# Patient Record
Sex: Female | Born: 1985
Health system: Southern US, Community
[De-identification: ages and names within clinical notes are randomized; demographics above are authoritative.]

## PROBLEM LIST (undated history)

## (undated) DIAGNOSIS — R7303 Prediabetes: Secondary | ICD-10-CM

## (undated) DIAGNOSIS — T7840XA Allergy, unspecified, initial encounter: Secondary | ICD-10-CM

## (undated) DIAGNOSIS — D649 Anemia, unspecified: Secondary | ICD-10-CM

## (undated) DIAGNOSIS — L259 Unspecified contact dermatitis, unspecified cause: Secondary | ICD-10-CM

## (undated) DIAGNOSIS — Z789 Other specified health status: Secondary | ICD-10-CM

## (undated) DIAGNOSIS — G4730 Sleep apnea, unspecified: Secondary | ICD-10-CM

## (undated) HISTORY — PX: WISDOM TOOTH EXTRACTION: SHX21

## (undated) HISTORY — PX: TOTAL ABDOMINAL HYSTERECTOMY: SHX209

## (undated) HISTORY — DX: Unspecified contact dermatitis, unspecified cause: L25.9

## (undated) HISTORY — PX: FOOT SURGERY: SHX648

## (undated) HISTORY — PX: ABDOMINAL HYSTERECTOMY: SHX81

## (undated) HISTORY — DX: Allergy, unspecified, initial encounter: T78.40XA

## (undated) HISTORY — DX: Prediabetes: R73.03

## (undated) HISTORY — DX: Anemia, unspecified: D64.9

## (undated) HISTORY — DX: Sleep apnea, unspecified: G47.30

---
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
## 2010-05-13 ENCOUNTER — Emergency Department (HOSPITAL_COMMUNITY): Admission: EM | Admit: 2010-05-13 | Discharge: 2009-11-10 | Payer: Self-pay | Admitting: Emergency Medicine

## 2010-09-10 ENCOUNTER — Inpatient Hospital Stay (HOSPITAL_COMMUNITY): Payer: Medicaid Other

## 2010-09-10 ENCOUNTER — Inpatient Hospital Stay (HOSPITAL_COMMUNITY)
Admission: AD | Admit: 2010-09-10 | Discharge: 2010-09-10 | Disposition: A | Discharge status: Home / Self Care | Payer: Medicaid Other | Source: Ambulatory Visit | Attending: Obstetrics and Gynecology | Admitting: Obstetrics and Gynecology

## 2010-09-10 DIAGNOSIS — L299 Pruritus, unspecified: Secondary | ICD-10-CM | POA: Insufficient documentation

## 2010-09-10 DIAGNOSIS — O9989 Other specified diseases and conditions complicating pregnancy, childbirth and the puerperium: Secondary | ICD-10-CM | POA: Insufficient documentation

## 2010-09-10 DIAGNOSIS — O479 False labor, unspecified: Secondary | ICD-10-CM | POA: Insufficient documentation

## 2010-09-10 LAB — DIFFERENTIAL
Basophils Relative: 0 % (ref 0–1)
Eosinophils Relative: 3 % (ref 0–5)
Lymphs Abs: 1.7 10*3/uL (ref 0.7–4.0)
Neutro Abs: 7.1 10*3/uL (ref 1.7–7.7)
Neutrophils Relative %: 72 % (ref 43–77)

## 2010-09-10 LAB — URINALYSIS, ROUTINE W REFLEX MICROSCOPIC
Bilirubin Urine: NEGATIVE
Hgb urine dipstick: NEGATIVE
Ketones, ur: NEGATIVE mg/dL
pH: 7 (ref 5.0–8.0)

## 2010-09-10 LAB — COMPREHENSIVE METABOLIC PANEL
Alkaline Phosphatase: 229 U/L — ABNORMAL HIGH (ref 39–117)
Calcium: 9.2 mg/dL (ref 8.4–10.5)
Creatinine, Ser: 0.57 mg/dL (ref 0.4–1.2)
GFR calc non Af Amer: >60 mL/min (ref >60)
Glucose, Bld: 82 mg/dL (ref 70–99)
Sodium: 136 mEq/L (ref 135–145)
Total Bilirubin: 0.7 mg/dL (ref 0.3–1.2)

## 2010-09-10 LAB — CBC
MCH: 25.9 pg — ABNORMAL LOW (ref 26.0–34.0)
WBC: 9.8 10*3/uL (ref 4.0–10.5)

## 2010-09-14 ENCOUNTER — Inpatient Hospital Stay (HOSPITAL_COMMUNITY): Admission: AD | Admit: 2010-09-14 | Payer: Self-pay | Admitting: Obstetrics and Gynecology

## 2010-09-17 ENCOUNTER — Inpatient Hospital Stay (HOSPITAL_COMMUNITY)
Admission: RE | Admit: 2010-09-17 | Discharge: 2010-09-19 | DRG: 775 | Disposition: A | Discharge status: Home / Self Care | Payer: Medicaid Other | Source: Ambulatory Visit | Attending: Obstetrics and Gynecology | Admitting: Obstetrics and Gynecology

## 2010-09-17 DIAGNOSIS — L299 Pruritus, unspecified: Secondary | ICD-10-CM | POA: Diagnosis present

## 2010-09-17 DIAGNOSIS — O26899 Other specified pregnancy related conditions, unspecified trimester: Principal | ICD-10-CM | POA: Diagnosis present

## 2010-09-17 LAB — CBC
HCT: 38.3 % (ref 36.0–46.0)
Hemoglobin: 12.5 g/dL (ref 12.0–15.0)
MCH: 26.6 pg (ref 26.0–34.0)
MCHC: 32.6 g/dL (ref 30.0–36.0)
Platelets: 191 10*3/uL (ref 150–400)
RBC: 4.7 MIL/uL (ref 3.87–5.11)
RDW: 16.1 % — ABNORMAL HIGH (ref 11.5–15.5)
WBC: 13.6 10*3/uL — ABNORMAL HIGH (ref 4.0–10.5)

## 2010-09-17 LAB — RPR: RPR Ser Ql: NONREACTIVE

## 2010-09-18 LAB — CBC
HCT: 36.7 % (ref 36.0–46.0)
Hemoglobin: 11.5 g/dL — ABNORMAL LOW (ref 12.0–15.0)
MCHC: 31.3 g/dL (ref 30.0–36.0)
Platelets: 169 10*3/uL (ref 150–400)
RBC: 4.45 MIL/uL (ref 3.87–5.11)
RDW: 16.4 % — ABNORMAL HIGH (ref 11.5–15.5)
WBC: 12.4 10*3/uL — ABNORMAL HIGH (ref 4.0–10.5)

## 2011-05-07 ENCOUNTER — Encounter: Payer: Self-pay | Admitting: Emergency Medicine

## 2011-05-07 ENCOUNTER — Emergency Department (HOSPITAL_COMMUNITY)
Admission: EM | Admit: 2011-05-07 | Discharge: 2011-05-08 | Disposition: A | Discharge status: Home / Self Care | Payer: Medicaid Other | Attending: Emergency Medicine | Admitting: Emergency Medicine

## 2011-05-07 DIAGNOSIS — A499 Bacterial infection, unspecified: Secondary | ICD-10-CM | POA: Insufficient documentation

## 2011-05-07 DIAGNOSIS — R109 Unspecified abdominal pain: Secondary | ICD-10-CM | POA: Insufficient documentation

## 2011-05-07 DIAGNOSIS — N760 Acute vaginitis: Secondary | ICD-10-CM | POA: Insufficient documentation

## 2011-05-07 DIAGNOSIS — B9689 Other specified bacterial agents as the cause of diseases classified elsewhere: Secondary | ICD-10-CM

## 2011-05-07 LAB — URINALYSIS, ROUTINE W REFLEX MICROSCOPIC
Hgb urine dipstick: NEGATIVE
Nitrite: NEGATIVE

## 2011-05-07 NOTE — ED Notes (Signed)
Pt presented to the ER with c/o abd pain, started about an hour and half ago, mid abd pain, non radiating, feels like a contractions, at best 7/10 and at its worse 10/10. Denies n/v/d

## 2011-05-08 LAB — POCT I-STAT, CHEM 8
BUN: 14 mg/dL (ref 6–23)
Calcium, Ion: 1.22 mmol/L (ref 1.12–1.32)
Chloride: 104 mEq/L (ref 96–112)
Creatinine, Ser: 0.8 mg/dL (ref 0.50–1.10)
Glucose, Bld: 96 mg/dL (ref 70–99)
HCT: 41 % (ref 36.0–46.0)
Hemoglobin: 13.9 g/dL (ref 12.0–15.0)
Sodium: 140 mEq/L (ref 135–145)

## 2011-05-08 LAB — CBC
Hemoglobin: 12.8 g/dL (ref 12.0–15.0)
MCHC: 32.4 g/dL (ref 30.0–36.0)
Platelets: 251 10*3/uL (ref 150–400)
RBC: 4.67 MIL/uL (ref 3.87–5.11)
WBC: 11.5 10*3/uL — ABNORMAL HIGH (ref 4.0–10.5)

## 2011-05-08 LAB — DIFFERENTIAL
Basophils Absolute: 0 10*3/uL (ref 0.0–0.1)
Eosinophils Absolute: 0.2 10*3/uL (ref 0.0–0.7)
Lymphocytes Relative: 33 % (ref 12–46)
Monocytes Absolute: 0.6 10*3/uL (ref 0.1–1.0)
Monocytes Relative: 5 % (ref 3–12)

## 2011-05-08 LAB — WET PREP, GENITAL

## 2011-05-08 MED ORDER — METRONIDAZOLE 500 MG PO TABS: 500.0000 mg | ORAL_TABLET | Freq: Two times a day (BID) | ORAL | Status: AC

## 2011-05-08 NOTE — ED Provider Notes (Signed)
History     CSN: [id number] Arrival date & time: 05/07/2011  9:14 PM   First MD Initiated Contact with Patient 05/08/11 0044      Chief Complaint  Patient presents with  . Abdominal Pain    (Consider location/radiation/quality/duration/timing/severity/associated sxs/prior treatment) HPI  History reviewed. No pertinent past medical history.  Past Surgical History  Procedure Date  . Wisdom tooth extraction     History reviewed. No pertinent family history.  History  Substance Use Topics  . Smoking status: Never Smoker   . Smokeless tobacco: Not on file  . Alcohol Use: Yes     cocktail every few months    OB History    Grav Para Term Preterm Abortions TAB SAB Ect Mult Living   2 2 2              Review of Systems  Allergies  Amoxicillin  Home Medications   Current Outpatient Rx  Name Route Sig Dispense Refill  . LEVONORGESTREL 20 MCG/24HR IU IUD Intrauterine 1 each by Intrauterine route once.      Marland Kitchen METRONIDAZOLE 500 MG PO TABS Oral Take 1 tablet (500 mg total) by mouth 2 (two) times daily. 14 tablet 0    BP 123/72  Pulse 85  Temp(Src) 98.8 F (37.1 C) (Oral)  Resp 18  SpO2 100%  Physical Exam  ED Course  Procedures Pt has had no further c/o abd pain.  Discussed findings and d/c plan w/ pt and spouse. Pt declined short course of medication for pain. Will d/c home on Flagyl and encourage f/u w/ PCP. Referrals provided.  Labs Reviewed  URINALYSIS, ROUTINE W REFLEX MICROSCOPIC - Abnormal; Notable for the following:    APPearance CLOUDY (*)    Protein, ur 30 (*)    All other components within normal limits  URINE MICROSCOPIC-ADD ON - Abnormal; Notable for the following:    Squamous Epithelial / LPF MANY (*)    Bacteria, UA MANY (*)    All other components within normal limits  WET PREP, GENITAL - Abnormal; Notable for the following:    Clue Cells, Wet Prep FEW (*)    WBC, Wet Prep HPF POC FEW (*)    All other components within normal limits  CBC -  Abnormal; Notable for the following:    WBC 11.5 (*)    All other components within normal limits  DIFFERENTIAL  POCT I-STAT, CHEM 8  URINE CULTURE  I-STAT, CHEM 8  GC/CHLAMYDIA PROBE AMP, GENITAL  PREGNANCY, URINE   No results found.   1. Bacterial vaginosis       MDM  Bacterial vaginosis per wet prep. Discussed pt w/ Dr Jeraldine Loots who agrees w/ plan.       Leanne Chang, NP 05/08/11 [phone]

## 2011-05-08 NOTE — ED Provider Notes (Signed)
History     CSN: [id number] Arrival date & time: 05/07/2011  9:14 PM   First MD Initiated Contact with Patient 05/08/11 0044      Chief Complaint  Patient presents with  . Abdominal Pain    (Consider location/radiation/quality/duration/timing/severity/associated sxs/prior treatment) Patient is a 25 y.o. female presenting with abdominal pain. The history is provided by the patient and the spouse.  Abdominal Pain The primary symptoms of the illness include abdominal pain. The primary symptoms of the illness do not include fever, nausea, vomiting, dysuria or vaginal discharge. Primary symptoms comment: She complains of cramping abdominal pain that eases to a dull ache, episodes recurrent since around 6:30 p.m. The current episode started 3 to 5 hours ago. The onset of the illness was sudden.  The patient states that she believes she is currently not pregnant. The patient has not had a change in bowel habit. Symptoms associated with the illness do not include chills.    History reviewed. No pertinent past medical history.  Past Surgical History  Procedure Date  . Wisdom tooth extraction     History reviewed. No pertinent family history.  History  Substance Use Topics  . Smoking status: Never Smoker   . Smokeless tobacco: Not on file  . Alcohol Use: Yes     cocktail every few months    OB History    Grav Para Term Preterm Abortions TAB SAB Ect Mult Living   2 2 2              Review of Systems  Constitutional: Negative for fever and chills.  HENT: Negative.   Respiratory: Negative.   Cardiovascular: Negative.   Gastrointestinal: Positive for abdominal pain. Negative for nausea and vomiting.  Genitourinary: Negative for dysuria and vaginal discharge.  Musculoskeletal: Negative.   Skin: Negative.   Neurological: Negative.     Allergies  Amoxicillin  Home Medications   Current Outpatient Rx  Name Route Sig Dispense Refill  . LEVONORGESTREL 20 MCG/24HR IU IUD  Intrauterine 1 each by Intrauterine route once.        BP 123/72  Pulse 85  Temp(Src) 98.8 F (37.1 C) (Oral)  Resp 18  SpO2 100%  Physical Exam  Constitutional: She appears well-developed and well-nourished.  HENT:  Head: Normocephalic.  Neck: Normal range of motion. Neck supple.  Cardiovascular: Normal rate and regular rhythm.   Pulmonary/Chest: Effort normal and breath sounds normal.  Abdominal: Soft. Bowel sounds are normal. There is no tenderness. There is no rebound and no guarding.  Genitourinary:       Purulent vaginal discharge present in cervix. IUD in place. Uterine tenderness without significant adnexal tenderness or mass.   Musculoskeletal: Normal range of motion.  Neurological: She is alert. No cranial nerve deficit.  Skin: Skin is warm and dry. No rash noted.  Psychiatric: She has a normal mood and affect.    ED Course  Procedures (including critical care time)  Labs Reviewed  URINALYSIS, ROUTINE W REFLEX MICROSCOPIC - Abnormal; Notable for the following:    APPearance CLOUDY (*)    Protein, ur 30 (*)    All other components within normal limits  URINE MICROSCOPIC-ADD ON - Abnormal; Notable for the following:    Squamous Epithelial / LPF MANY (*)    Bacteria, UA MANY (*)    All other components within normal limits  URINE CULTURE   No results found.   No diagnosis found.    MDM  Rodena Medin, PA 05/08/11 [phone]

## 2011-05-08 NOTE — ED Notes (Signed)
Patient was given DC instructions with follow up care instructions Patient gave verbal understanding V/S stable.   Patient DC to home under her own ambulatory power. She was not showing any signs of distress at this time

## 2011-05-09 LAB — URINE CULTURE

## 2011-05-09 LAB — GC/CHLAMYDIA PROBE AMP, GENITAL: Chlamydia, DNA Probe: NEGATIVE

## 2011-05-09 NOTE — ED Provider Notes (Signed)
Medical screening examination/treatment/procedure(s) were performed by non-physician practitioner and as supervising physician I was immediately available for consultation/collaboration.   Gerard Bonus, MD 05/09/11 0250 

## 2011-05-09 NOTE — ED Provider Notes (Signed)
Medical screening examination/treatment/procedure(s) were performed by non-physician practitioner and as supervising physician I was immediately available for consultation/collaboration.  (THIS IS A DUPLICATE NOTE)  Gerhard Munch, MD 05/09/11 [phone]

## 2011-10-13 ENCOUNTER — Emergency Department (INDEPENDENT_AMBULATORY_CARE_PROVIDER_SITE_OTHER): Payer: 59

## 2011-10-13 ENCOUNTER — Emergency Department (HOSPITAL_COMMUNITY)
Admission: EM | Admit: 2011-10-13 | Discharge: 2011-10-13 | Disposition: A | Discharge status: Home / Self Care | Payer: 59 | Source: Home / Self Care | Attending: Emergency Medicine | Admitting: Emergency Medicine

## 2011-10-13 ENCOUNTER — Encounter (HOSPITAL_COMMUNITY): Payer: Self-pay | Admitting: Emergency Medicine

## 2011-10-13 DIAGNOSIS — S8990XA Unspecified injury of unspecified lower leg, initial encounter: Secondary | ICD-10-CM

## 2011-10-13 DIAGNOSIS — S99929A Unspecified injury of unspecified foot, initial encounter: Secondary | ICD-10-CM

## 2011-10-13 MED ORDER — MELOXICAM 7.5 MG PO TABS: 7.5000 mg | ORAL_TABLET | Freq: Every day | ORAL | Status: DC

## 2011-10-13 NOTE — ED Notes (Signed)
PT HERE WITH LEFT KNEE SWELLING AND PAIN THAT STARTED X 2 WEEKS AGO.SWELLING SEEN DECREASED BUT UNABLE TO BEND OR SQUAT AND POPPING.DENIES INJURY OR STRAIN.TYLENOL AND ALEVE USED

## 2011-10-13 NOTE — ED Provider Notes (Signed)
History     CSN: [id number]  Arrival date & time 10/13/11  4098   First MD Initiated Contact with Patient 10/13/11 [phone]      Chief Complaint  Patient presents with  . Knee Pain    (Consider location/radiation/quality/duration/timing/severity/associated sxs/prior treatment) HPI Comments: For about 2 weeks my left knee has been hurting initially was somewhat swollen and have been hearing "popping sounds". Has been difficult to bend or squat. Swelling has come down but is still tender in this area (points to lateral aspect of left knee).  Patient denies any constitutional symptoms such as fever, malaise chills or changes in appetite. Patient denies any change skin coloration.  Patient is a 26 y.o. female presenting with knee pain. The history is provided by the patient.  Knee Pain The current episode started more than 1 week ago. The problem occurs constantly. The problem has not changed since onset.The symptoms are aggravated by bending, standing and walking. The symptoms are relieved by NSAIDs. The treatment provided mild relief.    History reviewed. No pertinent past medical history.  Past Surgical History  Procedure Date  . Wisdom tooth extraction     No family history on file.  History  Substance Use Topics  . Smoking status: Never Smoker   . Smokeless tobacco: Not on file  . Alcohol Use: Yes     cocktail every few months    OB History    Grav Para Term Preterm Abortions TAB SAB Ect Mult Living   2 2 2              Review of Systems  Constitutional: Negative for fever, chills, diaphoresis, activity change, appetite change and fatigue.  Musculoskeletal: Positive for joint swelling and arthralgias. Negative for back pain and gait problem.  Skin: Negative for color change, pallor, rash and wound.    Allergies  Amoxicillin  Home Medications   Current Outpatient Rx  Name Route Sig Dispense Refill  . LEVONORGESTREL 20 MCG/24HR IU IUD Intrauterine 1 each by  Intrauterine route once.      . MELOXICAM 7.5 MG PO TABS Oral Take 1 tablet (7.5 mg total) by mouth daily. 14 tablet 0    BP 108/79  Pulse 72  Temp(Src) 97.5 F (36.4 C) (Oral)  Resp 16  SpO2 100%  Physical Exam  Nursing note and vitals reviewed. Constitutional: She appears well-developed and well-nourished.  HENT:  Head: Normocephalic.  Eyes: Conjunctivae are normal.  Neck: Neck supple.  Pulmonary/Chest: Effort normal.  Abdominal: There is no rebound.  Musculoskeletal: She exhibits tenderness. She exhibits no edema.       Left knee: She exhibits decreased range of motion, ecchymosis and bony tenderness. She exhibits no swelling, no effusion, no deformity, no erythema, normal alignment, no LCL laxity, normal patellar mobility, normal meniscus and no MCL laxity. tenderness found. Lateral joint line and LCL tenderness noted. No patellar tendon tenderness noted.       Legs: Neurological: She is alert.  Skin: No rash noted. No erythema.    ED Course  Procedures (including critical care time)  Labs Reviewed - No data to display Dg Knee Complete 4 Views Left  10/13/2011  *RADIOLOGY REPORT*  Clinical Data: Knee pain, swelling.  LEFT KNEE - COMPLETE 4+ VIEW  Comparison: None.  Findings: No acute bony abnormality.  Specifically, no fracture, subluxation, or dislocation.  Soft tissues are intact.  No joint effusion.  IMPRESSION: No acute bony abnormality.  Original Report Authenticated By: Cyndie Chime, M.D.  1. Injury of knee, ligament       MDM  Exam suggest a left knee collateral or lateral meniscus sprain or strain. Knee feels stable with no increased laxity with different maneuvers. Patient denies any known trauma or injury or twisting motion. Have recommended patient to followup with the orthopedic doctor pain persists beyond in 10-14 days. Patient agree with treatment plan followup care as necessary. All signs of clinical or radiological knee effusion, no signs of a septic  knee.       Jimmie Molly, MD 10/13/11 [phone]

## 2011-10-13 NOTE — Discharge Instructions (Signed)
Followup with the orthopedic Dr. is provided knee sleeve and take this anti-inflammatory medicine. Your x-rays were unremarkable your exam to suggest a collateral ligament sprain/ strain or injury.   Knee Wraps (Elastic Bandage) and RICE Knee wraps come in many different shapes and sizes and perform many different functions. Some wraps may provide cold therapy or warmth. Your caregiver will help you to determine what is best for your protection, or recovery following your injury. The following are some general tips to help you use a knee wrap:  Use the wrap as directed.   Do not keep the wrap so tight that it cuts off the circulation of the leg below the wrap.   If your lower leg becomes blue, loses feeling, or becomes swollen below the wrap, it is probably too tight. Loosen the wrap as needed to improve these problems.   See your caregiver or trainer if the wrap seems to be making your problems worse rather than better.  Wraps in general help to remind you that you have an injury. They provide limited support. The few pounds of support they provide are minimal considering the hundreds of pounds of pressure it takes to injure a joint or tear ligaments.  The routine care of many injuries includes Rest, Ice, Compression, and Elevation (RICE).  Rest is required to allow your body to heal. Generally following bumps and bruises, routine activities can be resumed when comfortable. Injured tendons (cord-like structures that attach muscle to bone) and bones take approximately 6 to 12 weeks to heal.   Ice following an injury helps keep the swelling down and reduces pain. Do not apply ice directly to skin. Apply ice bags for 20-30 minutes every 3-4 hours for the first 2-3 days following injury or surgery. Place ice in a plastic bag with a towel around it.   Compression helps keep swelling down, gives support, and helps with discomfort. If a knee wrap has been applied, it should be removed and reapplied  every 3 to 4 hours. It should be applied firmly enough to keep swelling down, but not too tightly. Watch your lower leg and toes for swelling, bluish discoloration, coldness, numbness or excessive pain. If any of these symptoms (problems) occur, remove the knee wrap and reapply more loosely. If these symptoms persist, contact your caregiver immediately.   Elevation helps reduce swelling, and decreases pain. With extremities (arms/hands and legs/feet), the injured area should be placed near to or above the level of the heart if possible.  Persistent pain and inability to use the injured area for more than 2 to 3 days are warning signs indicating that you should see a caregiver for a follow-up visit as soon as possible. Initially, a hairline fracture (this is the same as a broken bone) may not be seen on x-rays.  Persistent pain and swelling mean limitedweight bearing (use of crutches as instructed) should continue. You may need further x-rays.  X-rays may not show a non-displaced fracture until a week or ten days later. Make a follow-up appointment with your caregiver. A radiologist (a specialist in reading x-rays) will re-read your x-rays. Make sure you know how to get your x-ray results. Do not assume everything is normal if you do not hear from your caregiver. MAKE SURE YOU:   Understand these instructions.   Will watch your condition.   Will get help right away if you are not doing well or get worse.  Document Released: 11/12/2001 Document Revised: 05/12/2011 Document Reviewed: 09/11/2008 [employer]  Patient Information 2012 [employer]. 

## 2012-04-04 ENCOUNTER — Encounter (HOSPITAL_COMMUNITY): Payer: Self-pay

## 2012-04-04 ENCOUNTER — Emergency Department (HOSPITAL_COMMUNITY)
Admission: EM | Admit: 2012-04-04 | Discharge: 2012-04-04 | Disposition: A | Discharge status: Home / Self Care | Payer: 59 | Source: Home / Self Care | Attending: Family Medicine | Admitting: Family Medicine

## 2012-04-04 ENCOUNTER — Emergency Department (INDEPENDENT_AMBULATORY_CARE_PROVIDER_SITE_OTHER): Payer: 59

## 2012-04-04 DIAGNOSIS — S6710XA Crushing injury of unspecified finger(s), initial encounter: Secondary | ICD-10-CM

## 2012-04-04 NOTE — ED Provider Notes (Signed)
History     CSN: [id number]  Arrival date & time 04/04/12  1623   First MD Initiated Contact with Patient 04/04/12 1917      Chief Complaint  Patient presents with  . Finger Injury    (Consider location/radiation/quality/duration/timing/severity/associated sxs/prior treatment) Patient is a 26 y.o. female presenting with hand pain. The history is provided by the patient.  Hand Pain This is a new problem. The current episode started 3 to 5 hours ago. The problem has not changed since onset.The symptoms are aggravated by bending. Nothing relieves the symptoms. She has tried nothing for the symptoms.  Patient reports she slammed her left fifth finger in car door today, pain is worse with movement and bending of finger.  Have not taken medication for injury.  No previous finger/hand injury.    History reviewed. No pertinent past medical history.  Past Surgical History  Procedure Date  . Wisdom tooth extraction     History reviewed. No pertinent family history.  History  Substance Use Topics  . Smoking status: Never Smoker   . Smokeless tobacco: Not on file  . Alcohol Use: Yes     cocktail every few months    OB History    Grav Para Term Preterm Abortions TAB SAB Ect Mult Living   2 2 2              Review of Systems  Constitutional: Negative.   Respiratory: Negative.   Cardiovascular: Negative.   Musculoskeletal: Positive for joint swelling and arthralgias.  Skin: Negative.   Neurological: Negative.     Allergies  Amoxicillin  Home Medications   Current Outpatient Rx  Name Route Sig Dispense Refill  . LEVONORGESTREL 20 MCG/24HR IU IUD Intrauterine 1 each by Intrauterine route once.      . MELOXICAM 7.5 MG PO TABS Oral Take 1 tablet (7.5 mg total) by mouth daily. 14 tablet 0    BP 113/83  Pulse 77  Temp 98.4 F (36.9 C) (Oral)  Resp 17  SpO2 98%  LMP 03/19/2012  Physical Exam  Nursing note and vitals reviewed. Constitutional: She is oriented to  person, place, and time. Vital signs are normal. She appears well-developed and well-nourished. She is active and cooperative.  HENT:  Head: Normocephalic.  Eyes: Conjunctivae normal are normal. Pupils are equal, round, and reactive to light. No scleral icterus.  Neck: Trachea normal. Neck supple.  Cardiovascular: Normal rate, regular rhythm, normal heart sounds and intact distal pulses.   Pulmonary/Chest: Effort normal and breath sounds normal.  Musculoskeletal:       Left hand: Normal.       Hands: Neurological: She is alert and oriented to person, place, and time. No cranial nerve deficit or sensory deficit.  Skin: Skin is warm and dry.  Psychiatric: She has a normal mood and affect. Her speech is normal and behavior is normal. Judgment and thought content normal. Cognition and memory are normal.    ED Course  Procedures (including critical care time)  Labs Reviewed - No data to display Dg Finger Little Left  04/04/2012  *RADIOLOGY REPORT*  Clinical Data: .  Pain DIP joint.Crush injury.  LEFT LITTLE FINGER 2+V  Comparison: None.  Findings: No evidence of fracture, dislocation or focal lesion.  IMPRESSION: Negative radiographs   Original Report Authenticated By: Thomasenia Sales, M.D.      1. Injury, crush, finger       MDM  Finger splint as needed, take ibuprofen for pain.  Johnsie Kindred, NP 04/04/12 2003

## 2012-04-04 NOTE — ED Notes (Signed)
States she accidentally slammed her left 5th finger in door earlier today; c/o pain/deformity same

## 2012-04-05 NOTE — ED Provider Notes (Signed)
Medical screening examination/treatment/procedure(s) were performed by resident physician or non-physician practitioner and as supervising physician I was immediately available for consultation/collaboration.   Shanara Schnieders DOUGLAS MD.    Jacinta Penalver D Adiva Boettner, MD 04/05/12 1855 

## 2012-05-22 ENCOUNTER — Ambulatory Visit: Payer: 59 | Admitting: Psychology

## 2012-07-25 ENCOUNTER — Other Ambulatory Visit (HOSPITAL_COMMUNITY)
Admission: RE | Admit: 2012-07-25 | Discharge: 2012-07-25 | Disposition: A | Discharge status: Home / Self Care | Payer: 59 | Source: Ambulatory Visit | Attending: Family Medicine | Admitting: Family Medicine

## 2012-07-25 ENCOUNTER — Other Ambulatory Visit: Payer: Self-pay | Admitting: Family Medicine

## 2012-07-25 DIAGNOSIS — Z0000 Encounter for general adult medical examination without abnormal findings: Secondary | ICD-10-CM | POA: Insufficient documentation

## 2012-08-30 ENCOUNTER — Emergency Department (HOSPITAL_COMMUNITY)
Admission: EM | Admit: 2012-08-30 | Discharge: 2012-08-31 | Disposition: A | Discharge status: Home / Self Care | Payer: 59 | Attending: Emergency Medicine | Admitting: Emergency Medicine

## 2012-08-30 ENCOUNTER — Encounter (HOSPITAL_COMMUNITY): Payer: Self-pay | Admitting: *Deleted

## 2012-08-30 ENCOUNTER — Emergency Department (HOSPITAL_COMMUNITY): Payer: 59

## 2012-08-30 ENCOUNTER — Encounter (HOSPITAL_COMMUNITY): Payer: Self-pay | Admitting: Adult Health

## 2012-08-30 ENCOUNTER — Emergency Department (HOSPITAL_COMMUNITY)
Admission: EM | Admit: 2012-08-30 | Discharge: 2012-08-30 | Disposition: A | Discharge status: Nursing Home (Includes ICF) | Payer: 59 | Source: Home / Self Care | Attending: Emergency Medicine | Admitting: Emergency Medicine

## 2012-08-30 DIAGNOSIS — R509 Fever, unspecified: Secondary | ICD-10-CM | POA: Insufficient documentation

## 2012-08-30 DIAGNOSIS — R109 Unspecified abdominal pain: Secondary | ICD-10-CM

## 2012-08-30 DIAGNOSIS — R1011 Right upper quadrant pain: Secondary | ICD-10-CM | POA: Insufficient documentation

## 2012-08-30 DIAGNOSIS — R112 Nausea with vomiting, unspecified: Secondary | ICD-10-CM | POA: Insufficient documentation

## 2012-08-30 DIAGNOSIS — Z3202 Encounter for pregnancy test, result negative: Secondary | ICD-10-CM | POA: Insufficient documentation

## 2012-08-30 LAB — CBC WITH DIFFERENTIAL/PLATELET
Basophils Absolute: 0 10*3/uL (ref 0.0–0.1)
Basophils Relative: 0 % (ref 0–1)
Eosinophils Absolute: 0.1 10*3/uL (ref 0.0–0.7)
HCT: 39.4 % (ref 36.0–46.0)
Hemoglobin: 13.1 g/dL (ref 12.0–15.0)
MCH: 28 pg (ref 26.0–34.0)
MCHC: 33.2 g/dL (ref 30.0–36.0)
Monocytes Absolute: 0.4 10*3/uL (ref 0.1–1.0)
Monocytes Relative: 4 % (ref 3–12)
RDW: 14.1 % (ref 11.5–15.5)

## 2012-08-30 LAB — POCT URINALYSIS DIP (DEVICE)
Ketones, ur: 15 mg/dL — AB
Leukocytes, UA: NEGATIVE
Protein, ur: NEGATIVE mg/dL
Specific Gravity, Urine: 1.025 (ref 1.005–1.030)
Urobilinogen, UA: 1 mg/dL (ref 0.0–1.0)
pH: 6.5 (ref 5.0–8.0)

## 2012-08-30 LAB — COMPREHENSIVE METABOLIC PANEL
Albumin: 4 g/dL (ref 3.5–5.2)
BUN: 10 mg/dL (ref 6–23)
Calcium: 9.2 mg/dL (ref 8.4–10.5)
Creatinine, Ser: 0.66 mg/dL (ref 0.50–1.10)
GFR calc Af Amer: >90 mL/min (ref >90)
Glucose, Bld: 91 mg/dL (ref 70–99)
Total Protein: 7.7 g/dL (ref 6.0–8.3)

## 2012-08-30 LAB — POCT PREGNANCY, URINE: Preg Test, Ur: NEGATIVE

## 2012-08-30 MED ORDER — MORPHINE SULFATE 4 MG/ML IJ SOLN
4.0000 mg | Freq: Once | INTRAMUSCULAR | Status: AC
  Administered 2012-08-31: 4 mg via INTRAVENOUS
  Filled 2012-08-30: qty 1

## 2012-08-30 MED ORDER — ONDANSETRON HCL 4 MG/2ML IJ SOLN
4.0000 mg | Freq: Once | INTRAMUSCULAR | Status: DC
  Filled 2012-08-30: qty 2

## 2012-08-30 MED ORDER — ONDANSETRON HCL 4 MG/2ML IJ SOLN
4.0000 mg | Freq: Once | INTRAMUSCULAR | Status: AC
  Administered 2012-08-30: 4 mg via INTRAVENOUS
  Filled 2012-08-30: qty 2

## 2012-08-30 MED ORDER — SODIUM CHLORIDE 0.9 % IV BOLUS (SEPSIS)
1000.0000 mL | Freq: Once | INTRAVENOUS | Status: AC
  Administered 2012-08-30: 1000 mL via INTRAVENOUS

## 2012-08-30 NOTE — ED Provider Notes (Signed)
History     CSN: [id number]  Arrival date & time 08/30/12  1916   First MD Initiated Contact with Patient 08/30/12 1918      Chief Complaint  Patient presents with  . Abdominal Pain    (Consider location/radiation/quality/duration/timing/severity/associated sxs/prior treatment) HPI Comments: Patient presents urgent care complaining that as she was working today she started feeling chills feeling nauseous and started vomiting, she has been experiencing some cramping pains to her epigastric and right upper quadrant regions earlier today. At this point she is feeling constant pain in her epigastric and right upper quadrant region. Feeling nauseous and has vomited 3 times. She denies any urinary symptoms such as increased frequency pressure burning with urination denies any flank pain, vaginal discharge or bleeding. Patient relates that in the past she was told she could have gallbladder problems that she has experienced right upper quadrant pain before.  Patient is a 27 y.o. female presenting with abdominal pain. The history is provided by the patient and the spouse.  Abdominal Pain Pain location:  Epigastric and RUQ Pain quality: aching and cramping   Pain radiates to:  Does not radiate Pain severity:  Moderate Onset quality:  Sudden Timing:  Intermittent Progression:  Worsening Chronicity:  New Context: not medication withdrawal and not retching   Relieved by:  Nothing Associated symptoms: nausea and vomiting   Associated symptoms: no chest pain, no constipation, no cough, no diarrhea, no dysuria, no fatigue, no fever and no shortness of breath     No past medical history on file.  Past Surgical History  Procedure Laterality Date  . Wisdom tooth extraction      No family history on file.  History  Substance Use Topics  . Smoking status: Never Smoker   . Smokeless tobacco: Not on file  . Alcohol Use: Yes     Comment: cocktail every few months    OB History   Grav Para  Term Preterm Abortions TAB SAB Ect Mult Living   2 2 2              Review of Systems  Constitutional: Positive for activity change and appetite change. Negative for fever, fatigue and unexpected weight change.  Respiratory: Negative for cough and shortness of breath.   Cardiovascular: Negative for chest pain.  Gastrointestinal: Positive for nausea, vomiting and abdominal pain. Negative for diarrhea, constipation, blood in stool, abdominal distention and anal bleeding.  Genitourinary: Negative for dysuria, flank pain and pelvic pain.  Skin: Negative for pallor and rash.    Allergies  Amoxicillin  Home Medications   Current Outpatient Rx  Name  Route  Sig  Dispense  Refill  . levonorgestrel (MIRENA) 20 MCG/24HR IUD   Intrauterine   1 each by Intrauterine route once.           . meloxicam (MOBIC) 7.5 MG tablet   Oral   Take 1 tablet (7.5 mg total) by mouth daily.   14 tablet   0     BP 105/71  Pulse 92  Temp(Src) 98.2 F (36.8 C) (Oral)  Resp 16  SpO2 99%  LMP 05/05/2012  Physical Exam  Nursing note and vitals reviewed. Constitutional: Vital signs are normal.  Non-toxic appearance. She does not have a sickly appearance. She appears ill. She appears distressed.  Eyes: No scleral icterus.  Neck: Neck supple. No JVD present.  Cardiovascular: Normal rate.   Murmur heard. Pulmonary/Chest: Effort normal and breath sounds normal. No respiratory distress.  Abdominal: Soft. She  exhibits no mass. There is splenomegaly. There is no hepatosplenomegaly or hepatomegaly. There is tenderness in the right upper quadrant and epigastric area. There is positive Murphy's sign. There is no rigidity, no rebound, no CVA tenderness and no tenderness at McBurney's point.  Skin: No rash noted. No erythema.    ED Course  Procedures (including critical care time)  Labs Reviewed  POCT URINALYSIS DIP (DEVICE) - Abnormal; Notable for the following:    Ketones, ur 15 (*)    Hgb urine  dipstick SMALL (*)    All other components within normal limits  POCT PREGNANCY, URINE   No results found.   1. Right upper quadrant abdominal pain       MDM  Joni Reining- presents urgent care this evening complaining of abdominal pain and about 3 episodes of emesis. Concerning exam for right upper quadrant pain. Will transfer patient to the emergency department for further evaluation and rule out obstructive biliary disorder. Patient has been placed on n.p.o. until further evaluation. Urinalysis was unremarkable. With a negative pregnancy test.        Jimmie Molly, MD 08/30/12 2053

## 2012-08-30 NOTE — ED Notes (Signed)
Presents with upper right quadrant abdominal pain, nausea and emesis x3 today that began while at work. Pain began as cramping in lower right quadrant and has changed to upper right quadrant and described as aching. Denies diarrhea, denies vaginal discharge. Reports that she has had gallbladder problems in the past. SEen at [facility] and sent here for further evaluation UA done at [facility] and Preg negative.

## 2012-08-30 NOTE — ED Notes (Signed)
C/o R upper quadrant abdominal pain onset today while cleaning a pt.'s room @ 1700.  Had lower abdominal cramps earlier in the day.  LMP  May 05, 2012.  Using [place] IUD.  Vomited x 3.  Vomit was pinkish the first time and yellowish the second and brownish the third time.  No diarrhea.

## 2012-08-31 LAB — URINALYSIS, ROUTINE W REFLEX MICROSCOPIC
Ketones, ur: 40 mg/dL — AB
Nitrite: NEGATIVE
Specific Gravity, Urine: 1.02 (ref 1.005–1.030)
pH: 6 (ref 5.0–8.0)

## 2012-08-31 LAB — URINE MICROSCOPIC-ADD ON

## 2012-08-31 MED ORDER — PROMETHAZINE HCL 12.5 MG PO TABS
12.5000 mg | ORAL_TABLET | Freq: Once | ORAL | Status: AC
  Administered 2012-08-31: 12.5 mg via ORAL
  Filled 2012-08-31: qty 1

## 2012-08-31 MED ORDER — OXYCODONE-ACETAMINOPHEN 5-325 MG PO TABS: ORAL_TABLET | ORAL | Status: DC

## 2012-08-31 MED ORDER — PROMETHAZINE HCL 25 MG PO TABS: 25.0000 mg | ORAL_TABLET | Freq: Four times a day (QID) | ORAL | Status: DC | PRN

## 2012-08-31 NOTE — ED Provider Notes (Signed)
History     CSN: [id number]  Arrival date & time 08/30/12  2103   First MD Initiated Contact with Patient 08/30/12 2235      Chief Complaint  Patient presents with  . Abdominal Pain    (Consider location/radiation/quality/duration/timing/severity/associated sxs/prior treatment) HPI  Casey Hamilton is a 27 y.o. female complaining of right upper quadrant pain starting yesterday. Patient has had 4 episodes of nonbloody, nonbilious, non-coffee-ground appearance emesis starting at 5 PM today. She reports subjective fevers. She denies diarrhea, chest pain, cough, shortness of breath. Patient was told she had gallbladder issues, she has not had any abdominal pain nausea vomiting in the last 7 years.  History reviewed. No pertinent past medical history.  Past Surgical History  Procedure Laterality Date  . Wisdom tooth extraction      History reviewed. No pertinent family history.  History  Substance Use Topics  . Smoking status: Never Smoker   . Smokeless tobacco: Not on file  . Alcohol Use: Yes     Comment: cocktail every few months    OB History   Grav Para Term Preterm Abortions TAB SAB Ect Mult Living   2 2 2              Review of Systems  Constitutional: Positive for fever.  Respiratory: Negative for shortness of breath.   Cardiovascular: Negative for chest pain.  Gastrointestinal: Positive for nausea, vomiting and abdominal pain. Negative for diarrhea.  All other systems reviewed and are negative.    Allergies  Amoxicillin  Home Medications   Current Outpatient Rx  Name  Route  Sig  Dispense  Refill  . Acetaminophen-Caff-Pyrilamine (MIDOL COMPLETE) 500-60-15 MG TABS   Oral   Take 2 tablets by mouth daily as needed (for cramps).         Marland Kitchen levonorgestrel (MIRENA) 20 MCG/24HR IUD   Intrauterine   1 each by Intrauterine route once. Implanted June 2012           BP 123/74  Pulse 93  Temp(Src) 98.3 F (36.8 C)  Resp 16  SpO2 100%  LMP  05/05/2012  Physical Exam  Nursing note and vitals reviewed. Constitutional: She is oriented to person, place, and time. She appears well-developed and well-nourished. No distress.  HENT:  Head: Normocephalic.  Mouth/Throat: Oropharynx is clear and moist.  Eyes: Conjunctivae and EOM are normal. Pupils are equal, round, and reactive to light.  Neck: Normal range of motion.  Cardiovascular: Normal rate, regular rhythm and intact distal pulses.   Pulmonary/Chest: Effort normal and breath sounds normal. No stridor. No respiratory distress. She has no wheezes. She has no rales. She exhibits no tenderness.  Abdominal: Soft. Bowel sounds are normal. She exhibits no distension and no mass. There is tenderness. There is no rebound and no guarding.  Positive Murphy sign. No guarding or rebound.  Musculoskeletal: Normal range of motion.  Neurological: She is alert and oriented to person, place, and time.  Psychiatric: She has a normal mood and affect.    ED Course  Procedures (including critical care time)  Labs Reviewed  CBC WITH DIFFERENTIAL - Abnormal; Notable for the following:    Neutrophils Relative 85 (*)    Lymphocytes Relative 10 (*)    All other components within normal limits  COMPREHENSIVE METABOLIC PANEL - Abnormal; Notable for the following:    Sodium 133 (*)    All other components within normal limits  LIPASE, BLOOD  URINALYSIS, ROUTINE W REFLEX MICROSCOPIC   US  Abdomen Complete  08/30/2012  *RADIOLOGY REPORT*  Clinical Data:  Right upper quadrant pain.  COMPLETE ABDOMINAL ULTRASOUND  Comparison:  None.  Findings:  Gallbladder:  No gallstones, gallbladder wall thickening, or pericholecystic fluid.  Common bile duct:  4 mm, normal.  Liver:  No focal lesion identified.  Within normal limits in parenchymal echogenicity.  IVC:  Appears normal.  Pancreas:  No focal abnormality seen.  Spleen:  7.2 cm.  Normal echotexture.  Right Kidney:  11.7 cm. Normal echotexture.  Normal central  sinus echo complex.  No calculi or hydronephrosis.  Left Kidney:  9.7 cm. Normal echotexture.  Normal central sinus echo complex.  No calculi or hydronephrosis.  Abdominal aorta:  No aneurysm identified.  IMPRESSION: Negative abdominal ultrasound.   Original Report Authenticated By: Andreas Newport, M.D.      1. Abdominal pain   2. Nausea and vomiting       MDM   Casey Hamilton is a 27 y.o. female with positive Murphy sign, abdominal pain started yesterday followed by 4 episodes of emesis today.   Urinalysis and urine pregnancy test from urgent care Center is unremarkable. CMP, CBC and lipase also grossly normal.  Abdominal ultrasound shows no biliary abnormalities.  Patient passed by mouth challenge, vital signs are stable, she is appropriate for discharge at this time.  Filed Vitals:   08/30/12 2118 08/30/12 2300  BP: 115/61 123/74  Pulse: 101 93  Temp: 98.3 F (36.8 C)   Resp: 16   SpO2: 99% 100%     Pt verbalized understanding and agrees with care plan. Outpatient follow-up and return precautions given.    New Prescriptions   OXYCODONE-ACETAMINOPHEN (PERCOCET/ROXICET) 5-325 MG PER TABLET    1 to 2 tabs PO q6hrs  PRN for pain   PROMETHAZINE (PHENERGAN) 25 MG TABLET    Take 1 tablet (25 mg total) by mouth every 6 (six) hours as needed for nausea.           Wynetta Emery, PA-C 08/31/12 0023

## 2012-09-01 ENCOUNTER — Encounter (HOSPITAL_COMMUNITY): Payer: Self-pay | Admitting: *Deleted

## 2012-09-01 LAB — URINE CULTURE: Colony Count: 45000

## 2012-09-01 NOTE — ED Provider Notes (Signed)
Medical screening examination/treatment/procedure(s) were performed by non-physician practitioner and as supervising physician I was immediately available for consultation/collaboration.  Ysela Hettinger K Linker, MD 09/01/12 0908 

## 2012-09-06 ENCOUNTER — Ambulatory Visit: Payer: 59 | Admitting: Psychology

## 2013-03-01 ENCOUNTER — Encounter (HOSPITAL_COMMUNITY): Payer: Self-pay | Admitting: Emergency Medicine

## 2013-03-01 ENCOUNTER — Emergency Department (HOSPITAL_COMMUNITY)
Admission: EM | Admit: 2013-03-01 | Discharge: 2013-03-01 | Disposition: A | Discharge status: Home / Self Care | Payer: 59 | Source: Home / Self Care | Attending: Emergency Medicine | Admitting: Emergency Medicine

## 2013-03-01 DIAGNOSIS — B9789 Other viral agents as the cause of diseases classified elsewhere: Secondary | ICD-10-CM

## 2013-03-01 DIAGNOSIS — R5381 Other malaise: Secondary | ICD-10-CM

## 2013-03-01 DIAGNOSIS — B349 Viral infection, unspecified: Secondary | ICD-10-CM

## 2013-03-01 DIAGNOSIS — R5383 Other fatigue: Secondary | ICD-10-CM

## 2013-03-01 LAB — POCT URINALYSIS DIP (DEVICE)
Bilirubin Urine: NEGATIVE
Hgb urine dipstick: NEGATIVE
Nitrite: NEGATIVE
Specific Gravity, Urine: 1.02 (ref 1.005–1.030)
pH: 8 (ref 5.0–8.0)

## 2013-03-01 LAB — POCT INFECTIOUS MONO SCREEN: Mono Screen: NEGATIVE

## 2013-03-01 LAB — POCT I-STAT, CHEM 8
BUN: 9 mg/dL (ref 6–23)
Chloride: 106 mEq/L (ref 96–112)
HCT: 41 % (ref 36.0–46.0)
Potassium: 3.9 mEq/L (ref 3.5–5.1)
Sodium: 142 mEq/L (ref 135–145)

## 2013-03-01 LAB — CBC WITH DIFFERENTIAL/PLATELET
Basophils Absolute: 0 10*3/uL (ref 0.0–0.1)
Basophils Relative: 0 % (ref 0–1)
HCT: 38.5 % (ref 36.0–46.0)
Lymphocytes Relative: 30 % (ref 12–46)
MCHC: 33.8 g/dL (ref 30.0–36.0)
Monocytes Absolute: 0.4 10*3/uL (ref 0.1–1.0)
Neutro Abs: 4.4 10*3/uL (ref 1.7–7.7)
Neutrophils Relative %: 62 % (ref 43–77)
Platelets: 238 10*3/uL (ref 150–400)
RDW: 13.8 % (ref 11.5–15.5)
WBC: 7.1 10*3/uL (ref 4.0–10.5)

## 2013-03-01 NOTE — ED Notes (Signed)
Instructed to change into a gown

## 2013-03-01 NOTE — ED Notes (Signed)
Abdominal cramping, nausea, no vomiting, no diarrhea.  Patient c/o feeling fatigued, no urinary symptoms, no vaginal discharge, last normal bm was yesterday.  Onset of symptoms was one week ago.

## 2013-03-01 NOTE — ED Provider Notes (Signed)
Medical screening examination/treatment/procedure(s) were performed by non-physician practitioner and as supervising physician I was immediately available for consultation/collaboration.  Aristides Luckey, M.D.  Ellery Meroney C Kairee Isa, MD 03/01/13 1747 

## 2013-03-01 NOTE — ED Provider Notes (Addendum)
CSN: [id number]     Arrival date & time 03/01/13  1556 History   First MD Initiated Contact with Patient 03/01/13 1610     Chief Complaint  Patient presents with  . Abdominal Cramping  . Nausea   (Consider location/radiation/quality/duration/timing/severity/associated sxs/prior Treatment) HPI Comments: 27 year old female presents complaining of abdominal cramping, nausea, and fatigue for the past week. She says the worst part is the fatigue which has been getting progressively worse over the past week. She says the abdominal cramping is mild, not as bad as period cramps. She has felt intermittently nauseous but has not actually vomited. She denies any fever, chills, rash, sick contacts, vomiting, diarrhea, cough, shortness of breath, chest pain, or any other symptoms. No dizziness or headache. No recent travel   History reviewed. No pertinent past medical history. Past Surgical History  Procedure Laterality Date  . Wisdom tooth extraction     No family history on file. History  Substance Use Topics  . Smoking status: Never Smoker   . Smokeless tobacco: Not on file  . Alcohol Use: Yes     Comment: cocktail every few months   OB History   Grav Para Term Preterm Abortions TAB SAB Ect Mult Living   2 2 2             Review of Systems  Constitutional: Positive for fatigue. Negative for fever and chills.  Eyes: Negative for visual disturbance.  Respiratory: Negative for cough and shortness of breath.   Cardiovascular: Negative for chest pain, palpitations and leg swelling.  Gastrointestinal: Positive for nausea and abdominal pain (cramping). Negative for vomiting.  Endocrine: Negative for polydipsia and polyuria.  Genitourinary: Negative for dysuria, urgency and frequency.  Musculoskeletal: Negative for myalgias and arthralgias.  Skin: Negative for rash.  Neurological: Negative for dizziness, weakness and light-headedness.    Allergies  Amoxicillin  Home Medications   Current  Outpatient Rx  Name  Route  Sig  Dispense  Refill  . Acetaminophen-Caff-Pyrilamine (MIDOL COMPLETE) 500-60-15 MG TABS   Oral   Take 2 tablets by mouth daily as needed (for cramps).         Marland Kitchen levonorgestrel (MIRENA) 20 MCG/24HR IUD   Intrauterine   1 each by Intrauterine route once. Implanted June 2012         . oxyCODONE-acetaminophen (PERCOCET/ROXICET) 5-325 MG per tablet      1 to 2 tabs PO q6hrs  PRN for pain   15 tablet   0   . promethazine (PHENERGAN) 25 MG tablet   Oral   Take 1 tablet (25 mg total) by mouth every 6 (six) hours as needed for nausea.   12 tablet   0    BP 110/74  Pulse 82  Temp(Src) 98.4 F (36.9 C) (Oral)  Resp 16  SpO2 98%  LMP 02/09/2013 Physical Exam  Nursing note and vitals reviewed. Constitutional: She is oriented to person, place, and time. Vital signs are normal. She appears well-developed and well-nourished. No distress.  HENT:  Head: Normocephalic and atraumatic.  Right Ear: External ear normal.  Left Ear: External ear normal.  Mouth/Throat: Oropharynx is clear and moist. No oropharyngeal exudate.  Eyes: Conjunctivae and EOM are normal. Pupils are equal, round, and reactive to light. No scleral icterus.  Neck: Normal range of motion. Neck supple. No JVD present. No tracheal deviation present. No thyromegaly present.  Cardiovascular: Normal rate, regular rhythm and normal heart sounds.  Exam reveals no friction rub.   No murmur heard. Pulmonary/Chest:  Effort normal and breath sounds normal. No respiratory distress. She has no wheezes. She has no rales. She exhibits no tenderness.  Abdominal: Soft. She exhibits no mass. There is no tenderness. There is no rebound and no guarding.  Musculoskeletal: Normal range of motion. She exhibits no edema.  Lymphadenopathy:    She has no cervical adenopathy.  Neurological: She is alert and oriented to person, place, and time. She has normal strength. Coordination normal.  Skin: Skin is warm and  dry. No rash noted. She is not diaphoretic.  Psychiatric: She has a normal mood and affect. Judgment normal.    ED Course  Procedures (including critical care time) Labs Review Labs Reviewed  POCT URINALYSIS DIP (DEVICE) - Abnormal; Notable for the following:    Protein, ur 30 (*)    Leukocytes, UA TRACE (*)    All other components within normal limits  POCT I-STAT, CHEM 8 - Abnormal; Notable for the following:    Glucose, Bld 109 (*)    All other components within normal limits  URINE CULTURE  CBC WITH DIFFERENTIAL  POCT INFECTIOUS MONO SCREEN   Imaging Review No results found.  1640: Detailed physical exam is normal. Checking labs. 1740: all labs normal   MDM   1. Fatigue   2. Viral syndrome    Treat symptomatically. Watchful waiting. Followup if not improving     Graylon Good, PA-C 03/01/13 1744   Urine culture showed possible UTI, although may have been a contaminated sample.  Called pt, she is feeling much better now.  Advised to f/u with her PCP or here if symptoms return or persist .  She is agreeable with this plan  Graylon Good, PA-C 03/02/13 1718

## 2013-03-02 LAB — URINE CULTURE: Colony Count: >=100000

## 2013-03-02 NOTE — ED Provider Notes (Signed)
Medical screening examination/treatment/procedure(s) were performed by non-physician practitioner and as supervising physician I was immediately available for consultation/collaboration.  Darianny Momon, M.D.  Axtyn Woehler C Lariyah Shetterly, MD 03/02/13 2108 

## 2013-04-06 LAB — OB RESULTS CONSOLE RPR
RPR: NONREACTIVE
RPR: REACTIVE

## 2013-04-06 LAB — OB RESULTS CONSOLE ABO/RH: RH TYPE: POSITIVE

## 2013-04-06 LAB — OB RESULTS CONSOLE RUBELLA ANTIBODY, IGM: Rubella: IMMUNE

## 2013-04-06 LAB — OB RESULTS CONSOLE GC/CHLAMYDIA
CHLAMYDIA, DNA PROBE: NEGATIVE
GC PROBE AMP, GENITAL: NEGATIVE

## 2013-04-06 LAB — OB RESULTS CONSOLE HIV ANTIBODY (ROUTINE TESTING): HIV: NONREACTIVE

## 2013-04-06 LAB — OB RESULTS CONSOLE HEPATITIS B SURFACE ANTIGEN: HEP B S AG: NEGATIVE

## 2013-04-06 LAB — OB RESULTS CONSOLE ANTIBODY SCREEN: Antibody Screen: NEGATIVE

## 2013-04-21 ENCOUNTER — Encounter (HOSPITAL_COMMUNITY): Payer: Self-pay | Admitting: Emergency Medicine

## 2013-04-21 ENCOUNTER — Emergency Department (HOSPITAL_COMMUNITY): Payer: 59

## 2013-04-21 ENCOUNTER — Emergency Department (HOSPITAL_COMMUNITY)
Admission: EM | Admit: 2013-04-21 | Discharge: 2013-04-22 | Disposition: A | Discharge status: Home / Self Care | Payer: 59 | Attending: Emergency Medicine | Admitting: Emergency Medicine

## 2013-04-21 DIAGNOSIS — Z3490 Encounter for supervision of normal pregnancy, unspecified, unspecified trimester: Secondary | ICD-10-CM

## 2013-04-21 DIAGNOSIS — O9989 Other specified diseases and conditions complicating pregnancy, childbirth and the puerperium: Secondary | ICD-10-CM | POA: Insufficient documentation

## 2013-04-21 DIAGNOSIS — R109 Unspecified abdominal pain: Secondary | ICD-10-CM | POA: Insufficient documentation

## 2013-04-21 DIAGNOSIS — Z880 Allergy status to penicillin: Secondary | ICD-10-CM | POA: Insufficient documentation

## 2013-04-21 LAB — CBC WITH DIFFERENTIAL/PLATELET
Basophils Relative: 0 % (ref 0–1)
Eosinophils Absolute: 0.1 10*3/uL (ref 0.0–0.7)
Hemoglobin: 12 g/dL (ref 12.0–15.0)
Lymphocytes Relative: 32 % (ref 12–46)
Lymphs Abs: 2.9 10*3/uL (ref 0.7–4.0)
MCH: 28.6 pg (ref 26.0–34.0)
MCV: 84.5 fL (ref 78.0–100.0)
Monocytes Relative: 6 % (ref 3–12)
Neutro Abs: 5.5 10*3/uL (ref 1.7–7.7)
Neutrophils Relative %: 61 % (ref 43–77)
RBC: 4.19 MIL/uL (ref 3.87–5.11)
RDW: 13.5 % (ref 11.5–15.5)
WBC: 9 10*3/uL (ref 4.0–10.5)

## 2013-04-21 LAB — URINALYSIS, ROUTINE W REFLEX MICROSCOPIC
Bilirubin Urine: NEGATIVE
Glucose, UA: 100 mg/dL — AB
Ketones, ur: 15 mg/dL — AB
Leukocytes, UA: NEGATIVE
Specific Gravity, Urine: 1.028 (ref 1.005–1.030)
pH: 6.5 (ref 5.0–8.0)

## 2013-04-21 LAB — COMPREHENSIVE METABOLIC PANEL
ALT: 12 U/L (ref 0–35)
Albumin: 3.7 g/dL (ref 3.5–5.2)
Alkaline Phosphatase: 60 U/L (ref 39–117)
BUN: 9 mg/dL (ref 6–23)
CO2: 26 mEq/L (ref 19–32)
Chloride: 102 mEq/L (ref 96–112)
GFR calc Af Amer: >90 mL/min (ref >90)
GFR calc non Af Amer: >90 mL/min (ref >90)
Glucose, Bld: 110 mg/dL — ABNORMAL HIGH (ref 70–99)
Potassium: 4 mEq/L (ref 3.5–5.1)
Sodium: 137 mEq/L (ref 135–145)
Total Bilirubin: 0.3 mg/dL (ref 0.3–1.2)
Total Protein: 7.5 g/dL (ref 6.0–8.3)

## 2013-04-21 LAB — HCG, QUANTITATIVE, PREGNANCY: hCG, Beta Chain, Quant, S: 14847 m[IU]/mL — ABNORMAL HIGH (ref <5)

## 2013-04-21 LAB — LIPASE, BLOOD: Lipase: 45 U/L (ref 11–59)

## 2013-04-21 LAB — WET PREP, GENITAL: Trich, Wet Prep: NONE SEEN

## 2013-04-21 MED ORDER — SODIUM CHLORIDE 0.9 % IV SOLN
1000.0000 mL | Freq: Once | INTRAVENOUS | Status: AC
  Administered 2013-04-21: 1000 mL via INTRAVENOUS

## 2013-04-21 MED ORDER — SODIUM CHLORIDE 0.9 % IV SOLN
1000.0000 mL | INTRAVENOUS | Status: DC
  Administered 2013-04-22: 1000 mL via INTRAVENOUS

## 2013-04-21 NOTE — ED Notes (Signed)
The pt is having lt lat abd pain for the past 1-2 hours.  She is upstairs working .  She is  6 weeks preg.  lmp oct 2.  She is nauseated.  She has seen her ob doctor abd this is her third child. No vaginal bleeding

## 2013-04-21 NOTE — ED Provider Notes (Signed)
CSN: [id number]     Arrival date & time 04/21/13  2046 History   First MD Initiated Contact with Patient 04/21/13 2140     Chief Complaint  Patient presents with  . Abdominal Pain   (Consider location/radiation/quality/duration/timing/severity/associated sxs/prior Treatment) HPI 27 y.o. g3p2 lmp October 2.  She has had two previous normal pregnancies.  She began having some dull left upper abdominal pain about 1.5 hours prior to evaluation.  She denies nausea, vomiting, diarrhea, vaginal discharge, urinary symptoms.  She has not had any prior surgeries.   History reviewed. No pertinent past medical history. Past Surgical History  Procedure Laterality Date  . Wisdom tooth extraction     No family history on file. History  Substance Use Topics  . Smoking status: Never Smoker   . Smokeless tobacco: Not on file  . Alcohol Use: Yes     Comment: cocktail every few months   OB History   Grav Para Term Preterm Abortions TAB SAB Ect Mult Living   2 2 2             Review of Systems  All other systems reviewed and are negative.    Allergies  Amoxicillin  Home Medications   Current Outpatient Rx  Name  Route  Sig  Dispense  Refill  . Prenatal Vit-Fe Fumarate-FA (PRENATAL MULTIVITAMIN) TABS tablet   Oral   Take 1 tablet by mouth daily at 12 noon.          BP 118/69  Pulse 96  Temp(Src) 98.1 F (36.7 C) (Oral)  Resp 16  Wt 198 lb 4.8 oz (89.948 kg)  SpO2 98%  LMP 03/07/2013 Physical Exam  Nursing note and vitals reviewed. Constitutional: She is oriented to person, place, and time. She appears well-developed and well-nourished.  HENT:  Head: Normocephalic and atraumatic.  Right Ear: External ear normal.  Left Ear: External ear normal.  Nose: Nose normal.  Mouth/Throat: Oropharynx is clear and moist.  Eyes: Conjunctivae and EOM are normal. Pupils are equal, round, and reactive to light.  Neck: Normal range of motion. Neck supple. No JVD present. No tracheal deviation  present. No thyromegaly present.  Cardiovascular: Normal rate, regular rhythm, normal heart sounds and intact distal pulses.   Pulmonary/Chest: Effort normal and breath sounds normal. No respiratory distress. She has no wheezes.  Abdominal: Soft. Bowel sounds are normal. She exhibits no mass. There is no tenderness. There is no guarding.  Genitourinary: Vagina normal and uterus normal. No vaginal discharge found.  Musculoskeletal: Normal range of motion.  Lymphadenopathy:    She has no cervical adenopathy.  Neurological: She is alert and oriented to person, place, and time. She has normal reflexes. No cranial nerve deficit or sensory deficit. Gait normal. GCS eye subscore is 4. GCS verbal subscore is 5. GCS motor subscore is 6.  Reflex Scores:      Bicep reflexes are 2+ on the right side and 2+ on the left side.      Patellar reflexes are 2+ on the right side and 2+ on the left side. Strength is 5/5 bilateral elbow flexor/extensors, wrist extension/flexion, intrinsic hand strength equal Bilateral hip flexion/extension 5/5, knee flexion/extension 5/5, ankle 5/5 flexion extension    Skin: Skin is warm and dry.  Psychiatric: She has a normal mood and affect. Her behavior is normal. Judgment and thought content normal.    ED Course  Procedures (including critical care time) Labs Review Labs Reviewed  WET PREP, GENITAL - Abnormal; Notable for the  following:    Clue Cells Wet Prep HPF POC MODERATE (*)    WBC, Wet Prep HPF POC FEW (*)    All other components within normal limits  URINALYSIS, ROUTINE W REFLEX MICROSCOPIC - Abnormal; Notable for the following:    APPearance CLOUDY (*)    Glucose, UA 100 (*)    Ketones, ur 15 (*)    All other components within normal limits  PREGNANCY, URINE - Abnormal; Notable for the following:    Preg Test, Ur POSITIVE (*)    All other components within normal limits  CBC WITH DIFFERENTIAL - Abnormal; Notable for the following:    HCT 35.4 (*)    All  other components within normal limits  COMPREHENSIVE METABOLIC PANEL - Abnormal; Notable for the following:    Glucose, Bld 110 (*)    All other components within normal limits  HCG, QUANTITATIVE, PREGNANCY - Abnormal; Notable for the following:    hCG, Beta Chain, Quant, S 14847 (*)    All other components within normal limits  GC/CHLAMYDIA PROBE AMP  LIPASE, BLOOD  RPR   Imaging Review No results found.  EKG Interpretation   None       MDM  No diagnosis found. Patient with nontender abdominal exam.  Plan ultrasound.  Discussed with Dr. Dierdre Highman and he will follow up.     Hilario Quarry, MD 04/21/13 [phone]

## 2013-04-22 LAB — GC/CHLAMYDIA PROBE AMP
CT Probe RNA: NEGATIVE
GC Probe RNA: NEGATIVE

## 2013-06-06 NOTE — L&D Delivery Note (Signed)
Delivery Note At 4:17 PM a viable female was delivered via Vaginal, Spontaneous Delivery (Presentation: Left Occiput Anterior).  APGAR: 9, 9; weight .   Placenta status: Intact, Spontaneous.  Cord: 3 vessels with the following complications: .  Cord pH: n/a  Anesthesia: Epidural  Episiotomy: None Lacerations: 1st degree bilateral labial lacs Suture Repair: 3.0 vicryl Est. Blood Loss (mL): 250 cc  Mom to postpartum.  Baby to Couplet care / Skin to Skin.  Purcell NailsROBERTS,ANGELA Y 12/03/2013, 5:19 PM

## 2013-09-04 ENCOUNTER — Inpatient Hospital Stay (HOSPITAL_COMMUNITY): Admission: AD | Admit: 2013-09-04 | Payer: 59 | Source: Ambulatory Visit | Admitting: Obstetrics and Gynecology

## 2013-10-21 ENCOUNTER — Encounter: Payer: Self-pay | Admitting: *Deleted

## 2013-10-23 ENCOUNTER — Encounter: Payer: Self-pay | Admitting: Gastroenterology

## 2013-11-01 ENCOUNTER — Ambulatory Visit (INDEPENDENT_AMBULATORY_CARE_PROVIDER_SITE_OTHER): Payer: Medicaid Other | Admitting: Gastroenterology

## 2013-11-01 ENCOUNTER — Other Ambulatory Visit (INDEPENDENT_AMBULATORY_CARE_PROVIDER_SITE_OTHER): Payer: Medicaid Other

## 2013-11-01 ENCOUNTER — Encounter: Payer: Self-pay | Admitting: Gastroenterology

## 2013-11-01 VITALS — BP 90/50 | HR 100 | Ht 66.0 in | Wt 208.0 lb

## 2013-11-01 DIAGNOSIS — K625 Hemorrhage of anus and rectum: Secondary | ICD-10-CM

## 2013-11-01 LAB — CBC WITH DIFFERENTIAL/PLATELET
Basophils Absolute: 0 10*3/uL (ref 0.0–0.1)
Basophils Relative: 0.1 % (ref 0.0–3.0)
EOS ABS: 0.3 10*3/uL (ref 0.0–0.7)
Eosinophils Relative: 3.7 % (ref 0.0–5.0)
HCT: 31.2 % — ABNORMAL LOW (ref 36.0–46.0)
Hemoglobin: 10.2 g/dL — ABNORMAL LOW (ref 12.0–15.0)
Lymphocytes Relative: 18.7 % (ref 12.0–46.0)
Lymphs Abs: 1.6 10*3/uL (ref 0.7–4.0)
MCHC: 32.7 g/dL (ref 30.0–36.0)
MCV: 78.5 fl (ref 78.0–100.0)
MONO ABS: 0.6 10*3/uL (ref 0.1–1.0)
Monocytes Relative: 6.4 % (ref 3.0–12.0)
NEUTROS PCT: 71.1 % (ref 43.0–77.0)
Neutro Abs: 6.2 10*3/uL (ref 1.4–7.7)
PLATELETS: 268 10*3/uL (ref 150.0–400.0)
RBC: 3.97 Mil/uL (ref 3.87–5.11)
RDW: 14.9 % (ref 11.5–15.5)
WBC: 8.8 10*3/uL (ref 4.0–10.5)

## 2013-11-01 NOTE — Progress Notes (Signed)
HPI: This is a   very pleasant 28 year old woman whom I am meeting for the first time today. She is [redacted] weeks pregnant.  Red rectal bleeding for 2-3 weeks, anal discomfort.  Not really constipated.  Her OB checked and did not notice hemorrhoids.  She did have a hard BM once in past.    Not diarrhea.  No colon cancer in her family.  34 weeks preg, girl.  Already has a 29 yo and 29yo girl.  No trouble with earlier pregnancies.   Review of systems: Pertinent positive and negative review of systems were noted in the above HPI section. Complete review of systems was performed and was otherwise normal.    Past Medical History  Diagnosis Date  . Contact dermatitis     Past Surgical History  Procedure Laterality Date  . Wisdom tooth extraction    . Foot surgery Right     Current Outpatient Prescriptions  Medication Sig Dispense Refill  . Prenatal Vit-Fe Fumarate-FA (PRENATAL MULTIVITAMIN) TABS tablet Take 1 tablet by mouth daily at 12 noon.       No current facility-administered medications for this visit.    Allergies as of 11/01/2013 - Review Complete 11/01/2013  Allergen Reaction Noted  . Amoxicillin Hives 05/07/2011    Family History  Problem Relation Age of Onset  . Hypertension Mother   . COPD Maternal Grandfather   . Diabetes Maternal Grandfather   . Epilepsy Paternal Grandmother   . Breast cancer Maternal Aunt   . Prostate cancer Maternal Uncle     History   Social History  . Marital Status: Married    Spouse Name: N/A    Number of Children: 2  . Years of Education: N/A   Occupational History  . Not on file.   Social History Main Topics  . Smoking status: Never Smoker   . Smokeless tobacco: Never Used  . Alcohol Use: Yes     Comment: cocktail every few months  . Drug Use: No  . Sexual Activity: Yes   Other Topics Concern  . Not on file   Social History Narrative  . No narrative on file       Physical Exam: Ht 5\' 6"  (1.676 m)  Wt 208 lb  (94.348 kg)  BMI 33.59 kg/m2 Constitutional: generally well-appearing Psychiatric: alert and oriented x3 Eyes: extraocular movements intact Mouth: oral pharynx moist, no lesions Neck: supple no lymphadenopathy Cardiovascular: heart regular rate and rhythm Lungs: clear to auscultation bilaterally Abdomen: soft, nontender, nondistended, no obvious ascites, no peritoneal signs, normal bowel sounds: pregnant Extremities: no lower extremity edema bilaterally Skin: no lesions on visible extremities Rectal exam with female assistant in the room: No external anal hemorrhoids, no internal anal hemorrhoids palpated, no obvious fissure disease. Stool is brown and not checked for Hemoccult. There were no distal rectal masses.   Assessment and plan: 28 y.o. female with  minor rectal bleeding, anal discomfort  She tells me that the discomfort is improving, she did not see blood yesterday when she moved her bowels. I suspect she had small fissure or hemorrhoid that is improving. She tells me she never has to strain to move her bowels and so I recommended no therapy at this point. I would like to rule out other potential causes of rectal bleeding such as a rectal polyp, distal sigmoid polyp or tumor which I think is very unlikely. I would like to proceed with flexible sigmoidoscopy however think it is best to this after she delivers  her baby. We will plan on August some time. She knows to call here sooner if her symptoms dramatically worsen prior to then.

## 2013-11-01 NOTE — Patient Instructions (Addendum)
You will have labs checked today in the basement lab.  Please head down after you check out with the front desk  (cbc). You will be set up for a flexible sigmoidoscopy in late August to give you time to recover from your delivery.  Please call [phone] when you are ready to set this appointment up.

## 2013-11-12 ENCOUNTER — Encounter (HOSPITAL_COMMUNITY): Payer: Self-pay | Admitting: *Deleted

## 2013-11-12 ENCOUNTER — Inpatient Hospital Stay (HOSPITAL_COMMUNITY)
Admission: AD | Admit: 2013-11-12 | Discharge: 2013-11-12 | Disposition: A | Discharge status: Home / Self Care | Payer: Medicaid Other | Source: Ambulatory Visit | Attending: Obstetrics and Gynecology | Admitting: Obstetrics and Gynecology

## 2013-11-12 DIAGNOSIS — M549 Dorsalgia, unspecified: Secondary | ICD-10-CM | POA: Diagnosis not present

## 2013-11-12 DIAGNOSIS — O4700 False labor before 37 completed weeks of gestation, unspecified trimester: Secondary | ICD-10-CM | POA: Insufficient documentation

## 2013-11-12 DIAGNOSIS — N949 Unspecified condition associated with female genital organs and menstrual cycle: Secondary | ICD-10-CM | POA: Insufficient documentation

## 2013-11-12 MED ORDER — LACTATED RINGERS IV BOLUS (SEPSIS)
500.0000 mL | Freq: Once | INTRAVENOUS | Status: AC
  Administered 2013-11-12 (×2): 500 mL via INTRAVENOUS

## 2013-11-12 MED ORDER — ONDANSETRON 4 MG PO TBDP: 4.0000 mg | ORAL_TABLET | Freq: Three times a day (TID) | ORAL | Status: DC | PRN

## 2013-11-12 MED ORDER — ONDANSETRON HCL 4 MG/2ML IJ SOLN
4.0000 mg | Freq: Once | INTRAMUSCULAR | Status: AC
  Administered 2013-11-12: 4 mg via INTRAVENOUS
  Filled 2013-11-12: qty 2

## 2013-11-12 MED ORDER — OXYCODONE-ACETAMINOPHEN 5-325 MG PO TABS
2.0000 | ORAL_TABLET | Freq: Once | ORAL | Status: AC
  Administered 2013-11-12: 2 via ORAL
  Filled 2013-11-12: qty 2

## 2013-11-12 MED ORDER — BUTORPHANOL TARTRATE 1 MG/ML IJ SOLN
1.0000 mg | Freq: Once | INTRAMUSCULAR | Status: AC
  Administered 2013-11-12: 1 mg via INTRAVENOUS
  Filled 2013-11-12: qty 1

## 2013-11-12 NOTE — MAU Provider Note (Signed)
S: 28 yo G3P2 @ 35.5 wks presented to MAU w/ c/o pelvic pressure, sharp back pain and vomiting x 2 since 0500; no nausea or diarrhea. Denies sick contact. Assumed care of pt at 0700. Has been unable to provide urine sample since  arrival - states nothing to drink in the past 24 hrs. Tylenol extra strength ineffective for ongoing back pain - describes as 8/10; last dose around 3 p.m. Yesterday.   O: Gen: Mild distress.        Vomiting since arrival. LR bolus w/ 4 mg IV Zofran ordered. No vomiting after 1/2 of bolus and Zofran. Percocet 2           tabs given after obtaining Cat I tracing and cervical exam - both tabs vomited.       FHT: Cat 1.      Toco: Ctxs every 2-4 min, palpate mild, spaced out to irregular with IVFs.       Pelvic: Long/thick/closed.        1005: Gave 1 mg stadol and an additional 4 mg dose of Zofran.  A: IUP at 35.5 wks     PTCs; suspect dehydration     Vomiting (resolved)     Back pain (resolved)       P:  BRAT diet, advance as tolerated      Hydrate      Pelvic rest      Frequent rest periods during the day      Strict PTL precautions      Continue daily fetal movement counts per protocol      Rx: Zofran 4 mg ODT q 8 hrs prn      Keep next OB appt  Pt's condition improved w/ IV stadol and 2nd dose of Zofran. She was able to tolerate crackers and juice prior to d/c.    Sherre Scarlet, CNM, MS 11/12/13 @ 10:45 AM

## 2013-11-12 NOTE — MAU Note (Signed)
Report sharp back pains that started last night in addition to pelvic pressure. Vomiting x2 that began 5 am this morning. Denies vaginal bleeding and LOF. +FM

## 2013-11-12 NOTE — Discharge Instructions (Signed)
Braxton Hicks Contractions Pregnancy is commonly associated with contractions of the uterus throughout the pregnancy. Towards the end of pregnancy (32 to 34 weeks), these contractions [facility]) can develop more often and may become more forceful. This is not true labor because these contractions do not result in opening (dilatation) and thinning of the cervix. They are sometimes difficult to tell apart from true labor because these contractions can be forceful and people have different pain tolerances. You should not feel embarrassed if you go to the hospital with false labor. Sometimes, the only way to tell if you are in true labor is for your caregiver to follow the changes in the cervix. How to tell the difference between true and false labor:  False labor.  The contractions of false labor are usually shorter, irregular and not as hard as those of true labor.  They are often felt in the front of the lower abdomen and in the groin.  They may leave with walking around or changing positions while lying down.  They get weaker and are shorter lasting as time goes on.  These contractions are usually irregular.  They do not usually become progressively stronger, regular and closer together as with true labor.  True labor.  Contractions in true labor last 30 to 70 seconds, become very regular, usually become more intense, and increase in frequency.  They do not go away with walking.  The discomfort is usually felt in the top of the uterus and spreads to the lower abdomen and low back.  True labor can be determined by your caregiver with an exam. This will show that the cervix is dilating and getting thinner. If there are no prenatal problems or other health problems associated with the pregnancy, it is completely safe to be sent home with false labor and await the onset of true labor. HOME CARE INSTRUCTIONS   Keep up with your usual exercises and instructions.  Take medications as  directed.  Keep your regular prenatal appointment.  Eat and drink lightly if you think you are going into labor.  If BH contractions are making you uncomfortable:  Change your activity position from lying down or resting to walking/walking to resting.  Sit and rest in a tub of warm water.  Drink 2 to 3 glasses of water. Dehydration may cause B-H contractions.  Do slow and deep breathing several times an hour. SEEK IMMEDIATE MEDICAL CARE IF:   Your contractions continue to become stronger, more regular, and closer together.  You have a gushing, burst or leaking of fluid from the vagina.  An oral temperature above 102 F (38.9 C) develops.  You have passage of blood-tinged mucus.  You develop vaginal bleeding.  You develop continuous belly (abdominal) pain.  You have low back pain that you never had before.  You feel the baby's head pushing down causing pelvic pressure.  The baby is not moving as much as it used to. Document Released: 05/23/2005 Document Revised: 08/15/2011 Document Reviewed: 03/04/2013 [facility] Patient Information 2014 [place]. Morning Sickness Morning sickness is when you feel sick to your stomach (nauseous) during pregnancy. This nauseous feeling may or may not come with vomiting. It often occurs in the morning but can be a problem any time of day. Morning sickness is most common during the first trimester, but it may continue throughout pregnancy. While morning sickness is unpleasant, it is usually harmless unless you develop severe and continual vomiting (hyperemesis gravidarum). This condition requires more intense treatment.  CAUSES  The cause of morning sickness is not completely known but seems to be related to normal hormonal changes that occur in pregnancy. RISK FACTORS You are at greater risk if you:  Experienced nausea or vomiting before your pregnancy.  Had morning sickness during a previous pregnancy.  Are pregnant with more than  one baby, such as twins. TREATMENT  Do not use any medicines (prescription, over-the-counter, or herbal) for morning sickness without first talking to your health care provider. Your health care provider may prescribe or recommend:  Vitamin B6 supplements.  Anti-nausea medicines.  The herbal medicine ginger. HOME CARE INSTRUCTIONS   Only take over-the-counter or prescription medicines as directed by your health care provider.  Taking multivitamins before getting pregnant can prevent or decrease the severity of morning sickness in most women.   Eat a piece of dry toast or unsalted crackers before getting out of bed in the morning.   Eat five or six small meals a day.   Eat dry and bland foods (rice, baked potato). Foods high in carbohydrates are often helpful.  Do not drink liquids with your meals. Drink liquids between meals.   Avoid greasy, fatty, and spicy foods.   Get someone to cook for you if the smell of any food causes nausea and vomiting.   If you feel nauseous after taking prenatal vitamins, take the vitamins at night or with a snack.  Snack on protein foods (nuts, yogurt, cheese) between meals if you are hungry.   Eat unsweetened gelatins for desserts.   Wearing an acupressure wristband (worn for sea sickness) may be helpful.   Acupuncture may be helpful.   Do not smoke.   Get a humidifier to keep the air in your house free of odors.   Get plenty of fresh air. SEEK MEDICAL CARE IF:   Your home remedies are not working, and you need medicine.  You feel dizzy or lightheaded.  You are losing weight. SEEK IMMEDIATE MEDICAL CARE IF:   You have persistent and uncontrolled nausea and vomiting.  You pass out (faint). Document Released: 07/14/2006 Document Revised: 01/23/2013 Document Reviewed: 11/07/2012 [facility] Patient Information 2014 [place], [place]. Back Pain in Pregnancy Back pain during pregnancy is common. It happens in about half of  all pregnancies. It is important for you and your baby that you remain active during your pregnancy.If you feel that back pain is not allowing you to remain active or sleep well, it is time to see your caregiver. Back pain may be caused by several factors related to changes during your pregnancy.Fortunately, unless you had trouble with your back before your pregnancy, the pain is likely to get better after you deliver. Low back pain usually occurs between the fifth and seventh months of pregnancy. It can, however, happen in the first couple months. Factors that increase the risk of back problems include:   Previous back problems.  Injury to your back.  Having twins or multiple births.  A chronic cough.  Stress.  Job-related repetitive motions.  Muscle or spinal disease in the back.  Family history of back problems, ruptured (herniated) discs, or osteoporosis.  Depression, anxiety, and panic attacks. CAUSES   When you are pregnant, your body produces a hormone called relaxin. This hormonemakes the ligaments connecting the low back and pubic bones more flexible. This flexibility allows the baby to be delivered more easily. When your ligaments are loose, your muscles need to work harder to support your back. Soreness in your back can  come from tired muscles. Soreness can also come from back tissues that are irritated since they are receiving less support.  As the baby grows, it puts pressure on the nerves and blood vessels in your pelvis. This can cause back pain.  As the baby grows and gets heavier during pregnancy, the uterus pushes the stomach muscles forward and changes your center of gravity. This makes your back muscles work harder to maintain good posture. SYMPTOMS  Lumbar pain during pregnancy Lumbar pain during pregnancy usually occurs at or above the waist in the center of the back. There may be pain and numbness that radiates into your leg or foot. This is similar to low back  pain experienced by non-pregnant women. It usually increases with sitting for long periods of time, standing, or repetitive lifting. Tenderness may also be present in the muscles along your upper back. Posterior pelvic pain during pregnancy Pain in the back of the pelvis is more common than lumbar pain in pregnancy. It is a deep pain felt in your side at the waistline, or across the tailbone (sacrum), or in both places. You may have pain on one or both sides. This pain can also go into the buttocks and backs of the upper thighs. Pubic and groin pain may also be present. The pain does not quickly resolve with rest, and morning stiffness may also be present. Pelvic pain during pregnancy can be brought on by most activities. A high level of fitness before and during pregnancy may or may not prevent this problem. Labor pain is usually 1 to 2 minutes apart, lasts for about 1 minute, and involves a bearing down feeling or pressure in your pelvis. However, if you are at term with the pregnancy, constant low back pain can be the beginning of early labor, and you should be aware of this. DIAGNOSIS  X-rays of the back should not be done during the first 12 to 14 weeks of the pregnancy and only when absolutely necessary during the rest of the pregnancy. MRIs do not give off radiation and are safe during pregnancy. MRIs also should only be done when absolutely necessary. HOME CARE INSTRUCTIONS  Exercise as directed by your caregiver. Exercise is the most effective way to prevent or manage back pain. If you have a back problem, it is especially important to avoid sports that require sudden body movements. Swimming and walking are great activities.  Do not stand in one place for long periods of time.  Do not wear high heels.  Sit in chairs with good posture. Use a pillow on your lower back if necessary. Make sure your head rests over your shoulders and is not hanging forward.  Try sleeping on your side, preferably  the left side, with a pillow or two between your legs. If you are sore after a night's rest, your bedmay betoo soft.Try placing a board between your mattress and box spring.  Listen to your body when lifting.If you are experiencing pain, ask for help or try bending yourknees more so you can use your leg muscles rather than your back muscles. Squat down when picking up something from the floor. Do not bend over.  Eat a healthy diet. Try to gain weight within your caregiver's recommendations.  Use heat or cold packs 3 to 4 times a day for 15 minutes to help with the pain.  Only take over-the-counter or prescription medicines for pain, discomfort, or fever as directed by your caregiver. Sudden (acute) back pain  Use  bed rest for only the most extreme, acute episodes of back pain. Prolonged bed rest over 48 hours will aggravate your condition.  Ice is very effective for acute conditions.  Put ice in a plastic bag.  Place a towel between your skin and the bag.  Leave the ice on for 10 to 20 minutes every 2 hours, or as needed.  Using heat packs for 30 minutes prior to activities is also helpful. Continued back pain See your caregiver if you have continued problems. Your caregiver can help or refer you for appropriate physical therapy. With conditioning, most back problems can be avoided. Sometimes, a more serious issue may be the cause of back pain. You should be seen right away if new problems seem to be developing. Your caregiver may recommend:  A maternity girdle.  An elastic sling.  A back brace.  A massage therapist or acupuncture. SEEK MEDICAL CARE IF:   You are not able to do most of your daily activities, even when taking the pain medicine you were given.  You need a referral to a physical therapist or chiropractor.  You want to try acupuncture. SEEK IMMEDIATE MEDICAL CARE IF:  You develop numbness, tingling, weakness, or problems with the use of your arms or  legs.  You develop severe back pain that is no longer relieved with medicines.  You have a sudden change in bowel or bladder control.  You have increasing pain in other areas of the body.  You develop shortness of breath, dizziness, or fainting.  You develop nausea, vomiting, or sweating.  You have back pain which is similar to labor pains.  You have back pain along with your water breaking or vaginal bleeding.  You have back pain or numbness that travels down your leg.  Your back pain developed after you fell.  You develop pain on one side of your back. You may have a kidney stone.  You see blood in your urine. You may have a bladder infection or kidney stone.  You have back pain with blisters. You may have shingles. Back pain is fairly common during pregnancy but should not be accepted as just part of the process. Back pain should always be treated as soon as possible. This will make your pregnancy as pleasant as possible. Document Released: 08/31/2005 Document Revised: 08/15/2011 Document Reviewed: 10/12/2010 [facility] Patient Information 2014 [place], [place].

## 2013-12-03 ENCOUNTER — Encounter (HOSPITAL_COMMUNITY): Payer: Medicaid Other | Admitting: Anesthesiology

## 2013-12-03 ENCOUNTER — Inpatient Hospital Stay (HOSPITAL_COMMUNITY): Payer: Medicaid Other | Admitting: Anesthesiology

## 2013-12-03 ENCOUNTER — Encounter (HOSPITAL_COMMUNITY): Payer: Self-pay | Admitting: *Deleted

## 2013-12-03 ENCOUNTER — Inpatient Hospital Stay (HOSPITAL_COMMUNITY)
Admission: AD | Admit: 2013-12-03 | Discharge: 2013-12-05 | DRG: 767 | Disposition: A | Discharge status: Home / Self Care | Payer: Medicaid Other | Source: Ambulatory Visit | Attending: Obstetrics and Gynecology | Admitting: Obstetrics and Gynecology

## 2013-12-03 DIAGNOSIS — B373 Candidiasis of vulva and vagina: Secondary | ICD-10-CM | POA: Diagnosis present

## 2013-12-03 DIAGNOSIS — Z8249 Family history of ischemic heart disease and other diseases of the circulatory system: Secondary | ICD-10-CM

## 2013-12-03 DIAGNOSIS — Z302 Encounter for sterilization: Secondary | ICD-10-CM

## 2013-12-03 DIAGNOSIS — M549 Dorsalgia, unspecified: Secondary | ICD-10-CM

## 2013-12-03 DIAGNOSIS — O9903 Anemia complicating the puerperium: Secondary | ICD-10-CM | POA: Diagnosis present

## 2013-12-03 DIAGNOSIS — K625 Hemorrhage of anus and rectum: Secondary | ICD-10-CM | POA: Diagnosis not present

## 2013-12-03 DIAGNOSIS — O479 False labor, unspecified: Secondary | ICD-10-CM | POA: Diagnosis present

## 2013-12-03 DIAGNOSIS — D649 Anemia, unspecified: Secondary | ICD-10-CM

## 2013-12-03 DIAGNOSIS — O2390 Unspecified genitourinary tract infection in pregnancy, unspecified trimester: Secondary | ICD-10-CM | POA: Diagnosis present

## 2013-12-03 DIAGNOSIS — Z833 Family history of diabetes mellitus: Secondary | ICD-10-CM

## 2013-12-03 DIAGNOSIS — O365990 Maternal care for other known or suspected poor fetal growth, unspecified trimester, not applicable or unspecified: Principal | ICD-10-CM | POA: Diagnosis present

## 2013-12-03 DIAGNOSIS — Z9851 Tubal ligation status: Secondary | ICD-10-CM

## 2013-12-03 DIAGNOSIS — B3731 Acute candidiasis of vulva and vagina: Secondary | ICD-10-CM | POA: Diagnosis not present

## 2013-12-03 DIAGNOSIS — O34219 Maternal care for unspecified type scar from previous cesarean delivery: Secondary | ICD-10-CM | POA: Diagnosis present

## 2013-12-03 DIAGNOSIS — Z820 Family history of epilepsy and other diseases of the nervous system: Secondary | ICD-10-CM

## 2013-12-03 DIAGNOSIS — O99891 Other specified diseases and conditions complicating pregnancy: Secondary | ICD-10-CM

## 2013-12-03 DIAGNOSIS — O9989 Other specified diseases and conditions complicating pregnancy, childbirth and the puerperium: Secondary | ICD-10-CM

## 2013-12-03 HISTORY — DX: Other specified health status: Z78.9

## 2013-12-03 LAB — CBC
HCT: 31.2 % — ABNORMAL LOW (ref 36.0–46.0)
Hemoglobin: 10 g/dL — ABNORMAL LOW (ref 12.0–15.0)
MCH: 24.9 pg — AB (ref 26.0–34.0)
MCHC: 32.1 g/dL (ref 30.0–36.0)
MCV: 77.6 fL — AB (ref 78.0–100.0)
PLATELETS: 224 10*3/uL (ref 150–400)
RBC: 4.02 MIL/uL (ref 3.87–5.11)
RDW: 15.3 % (ref 11.5–15.5)
WBC: 9.3 10*3/uL (ref 4.0–10.5)

## 2013-12-03 LAB — RPR

## 2013-12-03 LAB — OB RESULTS CONSOLE GBS: GBS: NEGATIVE

## 2013-12-03 LAB — OB RESULTS CONSOLE RPR: RPR: NONREACTIVE

## 2013-12-03 MED ORDER — SIMETHICONE 80 MG PO CHEW: 80.0000 mg | CHEWABLE_TABLET | ORAL | Status: DC | PRN

## 2013-12-03 MED ORDER — TETANUS-DIPHTH-ACELL PERTUSSIS 5-2.5-18.5 LF-MCG/0.5 IM SUSP: 0.5000 mL | Freq: Once | INTRAMUSCULAR | Status: DC

## 2013-12-03 MED ORDER — ACETAMINOPHEN 325 MG PO TABS: 650.0000 mg | ORAL_TABLET | ORAL | Status: DC | PRN

## 2013-12-03 MED ORDER — SENNOSIDES-DOCUSATE SODIUM 8.6-50 MG PO TABS
2.0000 | ORAL_TABLET | ORAL | Status: DC
  Administered 2013-12-03 – 2013-12-05 (×2): 2 via ORAL
  Filled 2013-12-03 (×2): qty 2

## 2013-12-03 MED ORDER — PRENATAL MULTIVITAMIN CH: 1.0000 | ORAL_TABLET | Freq: Every day | ORAL | Status: DC

## 2013-12-03 MED ORDER — LANOLIN HYDROUS EX OINT: TOPICAL_OINTMENT | CUTANEOUS | Status: DC | PRN

## 2013-12-03 MED ORDER — FAMOTIDINE 20 MG PO TABS
40.0000 mg | ORAL_TABLET | Freq: Once | ORAL | Status: AC
  Administered 2013-12-04: 40 mg via ORAL
  Filled 2013-12-03: qty 2

## 2013-12-03 MED ORDER — ONDANSETRON HCL 4 MG/2ML IJ SOLN: 4.0000 mg | INTRAMUSCULAR | Status: DC | PRN

## 2013-12-03 MED ORDER — IBUPROFEN 600 MG PO TABS
600.0000 mg | ORAL_TABLET | Freq: Four times a day (QID) | ORAL | Status: DC
  Administered 2013-12-03 – 2013-12-05 (×6): 600 mg via ORAL
  Filled 2013-12-03 (×7): qty 1

## 2013-12-03 MED ORDER — OXYCODONE-ACETAMINOPHEN 5-325 MG PO TABS
1.0000 | ORAL_TABLET | ORAL | Status: DC | PRN
  Administered 2013-12-04: 1 via ORAL
  Filled 2013-12-03: qty 1

## 2013-12-03 MED ORDER — LACTATED RINGERS IV SOLN: 500.0000 mL | INTRAVENOUS | Status: DC | PRN

## 2013-12-03 MED ORDER — PHENYLEPHRINE 40 MCG/ML (10ML) SYRINGE FOR IV PUSH (FOR BLOOD PRESSURE SUPPORT)
80.0000 ug | PREFILLED_SYRINGE | INTRAVENOUS | Status: DC | PRN
  Filled 2013-12-03: qty 2
  Filled 2013-12-03: qty 10

## 2013-12-03 MED ORDER — TERBUTALINE SULFATE 1 MG/ML IJ SOLN: 0.2500 mg | Freq: Once | INTRAMUSCULAR | Status: DC | PRN

## 2013-12-03 MED ORDER — CITRIC ACID-SODIUM CITRATE 334-500 MG/5ML PO SOLN
30.0000 mL | ORAL | Status: DC | PRN
Start: 2013-12-03 — End: 2013-12-03

## 2013-12-03 MED ORDER — OXYTOCIN BOLUS FROM INFUSION: 500.0000 mL | INTRAVENOUS | Status: DC

## 2013-12-03 MED ORDER — PHENYLEPHRINE 40 MCG/ML (10ML) SYRINGE FOR IV PUSH (FOR BLOOD PRESSURE SUPPORT)
80.0000 ug | PREFILLED_SYRINGE | INTRAVENOUS | Status: DC | PRN
  Filled 2013-12-03: qty 2

## 2013-12-03 MED ORDER — WITCH HAZEL-GLYCERIN EX PADS: 1.0000 "application " | MEDICATED_PAD | CUTANEOUS | Status: DC | PRN

## 2013-12-03 MED ORDER — LIDOCAINE HCL (PF) 1 % IJ SOLN
INTRAMUSCULAR | Status: DC | PRN
  Administered 2013-12-03 (×2): 5 mL

## 2013-12-03 MED ORDER — EPHEDRINE 5 MG/ML INJ
10.0000 mg | INTRAVENOUS | Status: DC | PRN
  Filled 2013-12-03: qty 2

## 2013-12-03 MED ORDER — LACTATED RINGERS IV SOLN
INTRAVENOUS | Status: DC
  Administered 2013-12-04 (×2): via INTRAVENOUS

## 2013-12-03 MED ORDER — LACTATED RINGERS IV SOLN
INTRAVENOUS | Status: DC
  Administered 2013-12-03 (×2): via INTRAVENOUS

## 2013-12-03 MED ORDER — FLEET ENEMA 7-19 GM/118ML RE ENEM: 1.0000 | ENEMA | RECTAL | Status: DC | PRN

## 2013-12-03 MED ORDER — DIBUCAINE 1 % RE OINT: 1.0000 "application " | TOPICAL_OINTMENT | RECTAL | Status: DC | PRN

## 2013-12-03 MED ORDER — METOCLOPRAMIDE HCL 10 MG PO TABS
10.0000 mg | ORAL_TABLET | Freq: Once | ORAL | Status: AC
  Administered 2013-12-04: 10 mg via ORAL
  Filled 2013-12-03: qty 1

## 2013-12-03 MED ORDER — OXYTOCIN 40 UNITS IN LACTATED RINGERS INFUSION - SIMPLE MED
62.5000 mL/h | INTRAVENOUS | Status: DC
Start: 2013-12-03 — End: 2013-12-03

## 2013-12-03 MED ORDER — OXYTOCIN 40 UNITS IN LACTATED RINGERS INFUSION - SIMPLE MED
1.0000 m[IU]/min | INTRAVENOUS | Status: DC
  Administered 2013-12-03: 2 m[IU]/min via INTRAVENOUS
  Administered 2013-12-03: 1 m[IU]/min via INTRAVENOUS
  Filled 2013-12-03: qty 1000

## 2013-12-03 MED ORDER — LIDOCAINE HCL (PF) 1 % IJ SOLN
30.0000 mL | INTRAMUSCULAR | Status: DC | PRN
  Filled 2013-12-03: qty 30

## 2013-12-03 MED ORDER — FENTANYL 2.5 MCG/ML BUPIVACAINE 1/10 % EPIDURAL INFUSION (WH - ANES)
14.0000 mL/h | INTRAMUSCULAR | Status: DC | PRN
  Administered 2013-12-03 (×2): 14 mL/h via EPIDURAL
  Filled 2013-12-03: qty 125

## 2013-12-03 MED ORDER — IBUPROFEN 600 MG PO TABS: 600.0000 mg | ORAL_TABLET | Freq: Four times a day (QID) | ORAL | Status: DC | PRN

## 2013-12-03 MED ORDER — DIPHENHYDRAMINE HCL 50 MG/ML IJ SOLN: 12.5000 mg | INTRAMUSCULAR | Status: DC | PRN

## 2013-12-03 MED ORDER — DIPHENHYDRAMINE HCL 25 MG PO CAPS: 25.0000 mg | ORAL_CAPSULE | Freq: Four times a day (QID) | ORAL | Status: DC | PRN

## 2013-12-03 MED ORDER — EPHEDRINE 5 MG/ML INJ
10.0000 mg | INTRAVENOUS | Status: DC | PRN
  Filled 2013-12-03 (×2): qty 4
  Filled 2013-12-03: qty 2
  Filled 2013-12-03: qty 4

## 2013-12-03 MED ORDER — NALBUPHINE HCL 10 MG/ML IJ SOLN: 5.0000 mg | INTRAMUSCULAR | Status: DC | PRN

## 2013-12-03 MED ORDER — ONDANSETRON HCL 4 MG PO TABS: 4.0000 mg | ORAL_TABLET | ORAL | Status: DC | PRN

## 2013-12-03 MED ORDER — BENZOCAINE-MENTHOL 20-0.5 % EX AERO: 1.0000 "application " | INHALATION_SPRAY | CUTANEOUS | Status: DC | PRN

## 2013-12-03 MED ORDER — OXYCODONE-ACETAMINOPHEN 5-325 MG PO TABS: 1.0000 | ORAL_TABLET | ORAL | Status: DC | PRN

## 2013-12-03 MED ORDER — LACTATED RINGERS IV SOLN
500.0000 mL | Freq: Once | INTRAVENOUS | Status: AC
  Administered 2013-12-03: 500 mL via INTRAVENOUS

## 2013-12-03 MED ORDER — ONDANSETRON HCL 4 MG/2ML IJ SOLN: 4.0000 mg | Freq: Four times a day (QID) | INTRAMUSCULAR | Status: DC | PRN

## 2013-12-03 MED ORDER — ZOLPIDEM TARTRATE 5 MG PO TABS: 5.0000 mg | ORAL_TABLET | Freq: Every evening | ORAL | Status: DC | PRN

## 2013-12-03 NOTE — MAU Note (Addendum)
Water broke 0730, clear fluid, clothing is soaked as is the chair. No bleeding. Closed when last checked. 3rd baby. No problems with preg.

## 2013-12-03 NOTE — H&P (Signed)
Casey Hamilton is a 28 y.o. female, G3P2 at 38.5 weeks, presenting for SROM since 0730, clear fluid, and UCs of increasing intensity and frequency during night. Reports active fetus. Denies VB. Hx SGA infant with first delivery, weight 4-13 at 38 wks, VAVD.     Marland Kitchen.  Patient Active Problem List   Diagnosis Date Noted  . Normal labor 12/03/2013  . Rectal bleeding - consult to GI; neg eval 12/03/2013  . SGA (small for gestational age) with first delivery (4lbs 13 oz at 38 wks; delivery by VE) 12/03/2013  . Back pain affecting pregnancy in third trimester 12/03/2013  . Vaginal yeast infection - treated during pregnancy 12/03/2013  Hx PUPPS in last pregnancy  History of present pregnancy: Patient entered care at 5150w4d.   EDC of 12/18/13 was established by LMP.   Anatomy scan: 18.4 weeks, with normal findings and a posterior placenta.   Additional US evaluations:  First trimester screen @ 12 4/7 wks; normal NT  32 5/7 wks for size less than dates. EFW 5-3; 80.4%tile, cervix 3.57 cm and closed, AFI  20.54 cm    Significant prenatal events: Seen in MAU on 11/12/13 for pelvic pressure, sharp back pain and vomiting. Issues resolved while in MAU, but back pain returned and continued for remainder of pregnancy. Hemoglobin 10.5 at 28 wks. Received Tdap on 09/09/13 and flu vaccine in Oct 2014.   Last evaluation:  11/26/13  OB History   Grav Para Term Preterm Abortions TAB SAB Ect Mult Living   3 3 3       1      09/02/04 -- 38 wks, 12 hr labor, 4-13, F, VAVD, Epidural, [facility] 09/17/10 -- 40.3 wks, 5 hr labor, 7-7, F, SVD, Epidural, [facility]  Past Medical History  Diagnosis Date  . Contact dermatitis   . Medical history non-contributory   Left foot fracture in high school Seasonal allergies  Past Surgical History  Procedure Laterality Date  . Wisdom tooth extraction    . Foot surgery Right    Family History: family history includes Breast cancer in her maternal aunt; COPD in her  maternal grandfather; Diabetes in her maternal grandfather; Epilepsy in her paternal grandmother; Hypertension in her mother; Prostate cancer in her maternal uncle. Diabetes in her paternal grandmother, Hypertension in her maternal grandfather. FOB son autistic, FOB w/ childhood meningitis.  Social History:  reports that she has never smoked. She has never used smokeless tobacco. She reports that she drinks alcohol. She reports that she does not use illicit drugs. Husband Link Snufferddie is involved and supportive. Pt has 12th grade education, works with [employer] in [employer], of the [place] ethnicity and is of [employer] faith.   Prenatal Transfer Tool  Maternal Diabetes: No Genetic Screening: Normal Maternal Ultrasounds/Referrals: Abnormal:  Findings:   Other:SGA Fetal Ultrasounds or other Referrals:  None Maternal Substance Abuse:  No Significant Maternal Medications:  Meds include: Other: PNV, Zofran Significant Maternal Lab Results: Lab values include: Group B Strep negative    ROS:  SROM, +ctxs  Allergies  Allergen Reactions  . Amoxicillin Hives     Dilation: 10 Effacement (%): 100 Station: +3 Exam by:: Donzetta Sprungebbie Warren, RN Blood pressure 90/53, pulse 70, temperature 98 F (36.7 C), temperature source Oral, resp. rate 18, height 5\' 6"  (1.676 m), weight 214 lb (97.07 kg), last menstrual period 03/07/2013, SpO2 98.00%, unknown if currently breastfeeding.  Chest clear Heart RRR without murmur Abd gravid, NT, FH 37.5 cm Pelvic: 3-4/50/-2 Ext: WNL  FHR: Cat 2 on admission (earlys, variables, mod variability, no obvious lates - accels 10x10 and 15x15) UCs:  Irregular ctxs, q 2-5 min  Prenatal labs: ABO, Rh: O/Positive/-- (11/01 0000) Antibody: Negative (11/01 0000) Rubella:   Immune RPR: Nonreactive (06/30 1851)  HBsAg: Negative (11/01 0000)  HIV: Non-reactive (11/01 0000)  GBS: Negative (06/30 0000) Sickle cell/Hgb electrophoresis:  AA Pap:  Normal Nov 2013 GC:  Neg @  [facility]B Chlamydia:  Neg @ [facility] Genetic screenings:  Neg Glucola:  Normal Other:  Hgb 12.6 @ NOB; 10.5 @ 28 wks       Assessment/Plan: IUP at 38 5/7 weeks  Early active labor SROM  GBS negative  Desires epidural   Plan:  Admit to Birthing Suite per consult with Dr. Su Hiltoberts Routine [facility] orders  Will proceed with epidural plan Pitocin augmentation as needed IUPC prn Postpartum tubal prior to d/c - tubal papers signed on 09/09/13  Robyne AskewWILLIAMS, KIMBERLYCNM, MS 12/03/13 @ 9:10 AM

## 2013-12-03 NOTE — Progress Notes (Signed)
  Subjective: Comfortable w/ epidural. Family at bedside.  Objective: BP 112/84  Pulse 67  Temp(Src) 98.2 F (36.8 C) (Oral)  Resp 18  Ht 5\' 6"  (1.676 m)  Wt 214 lb (97.07 kg)  BMI 34.56 kg/m2  SpO2 98%  LMP 03/07/2013     FHT: Cat 2 (early decels, min-mod variability, +accels) UC:   irregular, every 5-6 minutes SVE:   Dilation: 5 Effacement (%): 100 Station: -2 Exam by:: Casey Sprungebbie Warren, RN Leaking clear fluid--SROM 0730 today Small amount of bloody show IUPC placed   Assessment:  Early/active labor Slow progress SROM GBS neg  Plan: Begin Pitocin augmentation Consult prn Expect progress and SVD  Casey Hamilton, Casey Hamilton CNM 12/03/2013, 1:05 PM

## 2013-12-03 NOTE — Anesthesia Preprocedure Evaluation (Signed)

## 2013-12-03 NOTE — Anesthesia Procedure Notes (Signed)
Epidural Patient location during procedure: OB Start time: 12/03/2013 10:38 AM  Staffing Anesthesiologist: Brayton CavesJACKSON, FREEMAN Performed by: anesthesiologist   Preanesthetic Checklist Completed: patient identified, site marked, surgical consent, pre-op evaluation, timeout performed, IV checked, risks and benefits discussed and monitors and equipment checked  Epidural Patient position: sitting Prep: site prepped and draped and DuraPrep Patient monitoring: continuous pulse ox and blood pressure Approach: midline Location: L3-L4 Injection technique: LOR air  Needle:  Needle type: Tuohy  Needle gauge: 17 G Needle length: 9 cm and 9 Needle insertion depth: 5 cm cm Catheter type: closed end flexible Catheter size: 19 Gauge Catheter at skin depth: 10 cm Test dose: negative  Assessment Events: blood not aspirated, injection not painful, no injection resistance, negative IV test and no paresthesia  Additional Notes Patient identified.  Risk benefits discussed including failed block, incomplete pain control, headache, nerve damage, paralysis, blood pressure changes, nausea, vomiting, reactions to medication both toxic or allergic, and postpartum back pain.  Patient expressed understanding and wished to proceed.  All questions were answered.  Sterile technique used throughout procedure and epidural site dressed with sterile barrier dressing. No paresthesia or other complications noted.The patient did not experience any signs of intravascular injection such as tinnitus or metallic taste in mouth nor signs of intrathecal spread such as rapid motor block. Please see nursing notes for vital signs.

## 2013-12-04 ENCOUNTER — Encounter (HOSPITAL_COMMUNITY)
Admission: AD | Disposition: A | Discharge status: Home / Self Care | Payer: Self-pay | Source: Ambulatory Visit | Attending: Obstetrics and Gynecology

## 2013-12-04 ENCOUNTER — Inpatient Hospital Stay (HOSPITAL_COMMUNITY): Payer: Medicaid Other | Admitting: Anesthesiology

## 2013-12-04 ENCOUNTER — Encounter (HOSPITAL_COMMUNITY): Payer: Medicaid Other | Admitting: Anesthesiology

## 2013-12-04 HISTORY — PX: TUBAL LIGATION: SHX77

## 2013-12-04 LAB — CBC
HCT: 27.2 % — ABNORMAL LOW (ref 36.0–46.0)
HEMOGLOBIN: 8.7 g/dL — AB (ref 12.0–15.0)
MCH: 24.9 pg — ABNORMAL LOW (ref 26.0–34.0)
MCHC: 32 g/dL (ref 30.0–36.0)
MCV: 77.7 fL — ABNORMAL LOW (ref 78.0–100.0)
PLATELETS: 183 10*3/uL (ref 150–400)
RBC: 3.5 MIL/uL — AB (ref 3.87–5.11)
RDW: 15.5 % (ref 11.5–15.5)
WBC: 13.5 10*3/uL — AB (ref 4.0–10.5)

## 2013-12-04 LAB — SURGICAL PCR SCREEN
MRSA, PCR: NEGATIVE
Staphylococcus aureus: NEGATIVE

## 2013-12-04 SURGERY — LIGATION, FALLOPIAN TUBE, POSTPARTUM
Anesthesia: Epidural | Laterality: Bilateral

## 2013-12-04 MED ORDER — KETOROLAC TROMETHAMINE 30 MG/ML IJ SOLN
INTRAMUSCULAR | Status: AC
  Filled 2013-12-04: qty 1

## 2013-12-04 MED ORDER — FENTANYL CITRATE 0.05 MG/ML IJ SOLN
INTRAMUSCULAR | Status: AC
  Filled 2013-12-04: qty 5

## 2013-12-04 MED ORDER — PROMETHAZINE HCL 25 MG/ML IJ SOLN: 6.2500 mg | INTRAMUSCULAR | Status: DC | PRN

## 2013-12-04 MED ORDER — MEPERIDINE HCL 25 MG/ML IJ SOLN: 6.2500 mg | INTRAMUSCULAR | Status: DC | PRN

## 2013-12-04 MED ORDER — KETOROLAC TROMETHAMINE 30 MG/ML IJ SOLN: 15.0000 mg | Freq: Once | INTRAMUSCULAR | Status: AC | PRN

## 2013-12-04 MED ORDER — FENTANYL CITRATE 0.05 MG/ML IJ SOLN
25.0000 ug | INTRAMUSCULAR | Status: DC | PRN
  Administered 2013-12-04: 50 ug via INTRAVENOUS

## 2013-12-04 MED ORDER — MIDAZOLAM HCL 5 MG/5ML IJ SOLN
INTRAMUSCULAR | Status: DC | PRN
  Administered 2013-12-04 (×2): 0.5 mg via INTRAVENOUS
  Administered 2013-12-04: 1 mg via INTRAVENOUS

## 2013-12-04 MED ORDER — LIDOCAINE-EPINEPHRINE (PF) 2 %-1:200000 IJ SOLN
INTRAMUSCULAR | Status: AC
  Filled 2013-12-04: qty 20

## 2013-12-04 MED ORDER — SODIUM BICARBONATE 8.4 % IV SOLN
INTRAVENOUS | Status: AC
  Filled 2013-12-04: qty 50

## 2013-12-04 MED ORDER — ONDANSETRON HCL 4 MG/2ML IJ SOLN
INTRAMUSCULAR | Status: AC
  Filled 2013-12-04: qty 2

## 2013-12-04 MED ORDER — FERROUS SULFATE 325 (65 FE) MG PO TABS
325.0000 mg | ORAL_TABLET | Freq: Two times a day (BID) | ORAL | Status: DC
  Administered 2013-12-04 – 2013-12-05 (×2): 325 mg via ORAL
  Filled 2013-12-04 (×2): qty 1

## 2013-12-04 MED ORDER — KETOROLAC TROMETHAMINE 30 MG/ML IJ SOLN
INTRAMUSCULAR | Status: DC | PRN
  Administered 2013-12-04: 30 mg via INTRAVENOUS

## 2013-12-04 MED ORDER — MIDAZOLAM HCL 2 MG/2ML IJ SOLN: 0.5000 mg | Freq: Once | INTRAMUSCULAR | Status: AC | PRN

## 2013-12-04 MED ORDER — MIDAZOLAM HCL 2 MG/2ML IJ SOLN
INTRAMUSCULAR | Status: AC
  Filled 2013-12-04: qty 2

## 2013-12-04 MED ORDER — BUPIVACAINE-EPINEPHRINE 0.5% -1:200000 IJ SOLN
INTRAMUSCULAR | Status: DC | PRN
  Administered 2013-12-04: 5 mL

## 2013-12-04 MED ORDER — SODIUM BICARBONATE 8.4 % IV SOLN
INTRAVENOUS | Status: DC | PRN
  Administered 2013-12-04 (×5): 5 mL via EPIDURAL

## 2013-12-04 MED ORDER — BUPIVACAINE-EPINEPHRINE (PF) 0.5% -1:200000 IJ SOLN
INTRAMUSCULAR | Status: AC
  Filled 2013-12-04: qty 30

## 2013-12-04 SURGICAL SUPPLY — 24 items
CHLORAPREP W/TINT 26ML (MISCELLANEOUS) ×2 IMPLANT
CLOTH BEACON ORANGE TIMEOUT ST (SAFETY) ×2 IMPLANT
CONTAINER PREFILL 10% NBF 15ML (MISCELLANEOUS) ×4 IMPLANT
DRSG OPSITE POSTOP 3X4 (GAUZE/BANDAGES/DRESSINGS) ×2 IMPLANT
ELECT REM PT RETURN 9FT ADLT (ELECTROSURGICAL)
ELECTRODE REM PT RTRN 9FT ADLT (ELECTROSURGICAL) IMPLANT
GLOVE BIOGEL PI IND STRL 8.5 (GLOVE) ×1 IMPLANT
GLOVE BIOGEL PI INDICATOR 8.5 (GLOVE) ×1
GLOVE ECLIPSE 8.0 STRL XLNG CF (GLOVE) ×4 IMPLANT
GOWN STRL REUS W/TWL 2XL LVL3 (GOWN DISPOSABLE) ×2 IMPLANT
GOWN STRL REUS W/TWL LRG LVL3 (GOWN DISPOSABLE) ×4 IMPLANT
NEEDLE HYPO 25X1 1.5 SAFETY (NEEDLE) ×2 IMPLANT
NS IRRIG 1000ML POUR BTL (IV SOLUTION) ×2 IMPLANT
PACK ABDOMINAL MINOR (CUSTOM PROCEDURE TRAY) ×2 IMPLANT
PENCIL BUTTON HOLSTER BLD 10FT (ELECTRODE) IMPLANT
SPONGE LAP 4X18 X RAY DECT (DISPOSABLE) ×2 IMPLANT
SUT MNCRL AB 3-0 PS2 27 (SUTURE) ×2 IMPLANT
SUT PLAIN 0 NONE (SUTURE) ×2 IMPLANT
SUT VIC AB 2-0 CT1 27 (SUTURE) ×1
SUT VIC AB 2-0 CT1 TAPERPNT 27 (SUTURE) ×1 IMPLANT
SYR CONTROL 10ML LL (SYRINGE) ×2 IMPLANT
TOWEL OR 17X24 6PK STRL BLUE (TOWEL DISPOSABLE) ×4 IMPLANT
TRAY FOLEY CATH 14FR (SET/KITS/TRAYS/PACK) ×2 IMPLANT
WATER STERILE IRR 1000ML POUR (IV SOLUTION) ×2 IMPLANT

## 2013-12-04 NOTE — Progress Notes (Signed)
PROGRESS NOTE  I have reviewed the patient's vital signs, labs, and notes. I have examined the patient. I agree with the previous note from the Certified Nurse Midwife.  Postpartum tubal sterilization discussed. I reviewed the risk and benefits including anesthetic complications, bleeding, infections, possible damage to the surrounding organs, and possible tubal failure (17 per 1000). All questions were answered. She is ready to proceed.   Leonard SchwartzArthur Vernon Jina Olenick, M.D. 12/04/2013

## 2013-12-04 NOTE — Op Note (Signed)
OPERATIVE NOTE   Casey Hamilton   DOB: 09-28-1985   MRN: [id number]   CSN: [id number]   Date of Surgery: 12/04/2013   Preoperative Diagnosis:   Postpartum day # 1  Desires Sterilization   Postoperative Diagnosis:   Same   Procedure:   Modified [place] Postpartum Bilateral Tubal Sterilization Procedure   Surgeon:   Casey Hamilton, M.D.   Assistant:   None   Anesthetic:   Epidural  Disposition:   The patient is a 28 y.o.-year-old female, now [id number], who desires sterilization. She understands the indications for surgical procedure. She accepts the risk of, but not limited to, anesthetic complications, bleeding, infections, possible damage to the surrounding organs, and possible tubal failure (17 per 1000).   Findings:   The fallopian tubes were normal bilaterally.   Procedure:   The patient was taken to the operating room where  The Epidural anesthesia was noted to be adequate for surgery. The patient's abdomen was prepped with multiple layers of ChloraPrep. A foley catheter was placed after sterile preparation. She was then sterilely draped. The subumbilical area was injected with half percent Marcaine with epinephrine. A subumbilical incision was made and carried sharply through the subcutaneous tissue, the fascia, and the anterior peritoneum. The left fallopian tube was identified and followed to its fimbriated end. A knuckle of tube was made on the left using a free tie and individual ties of 0 plain catgut suture. The knuckle of tube was then excised. Hemostasis was adequate. An identical procedure was carried out on the opposite side. Again hemostasis was adequate. The fascia and the anterior peritoneum were closed using a running suture of 2-0 Vicryl. The skin was reapproximated using a subcuticular suture of 3-0 Monocryl. Sponge, needle, and instrument counts were correct on 2 occasions. The estimated blood loss was 5 cc's. The patient tolerated her  procedure well. She was transported to the recovery room in stable condition. The cut portions of the fallopian tubes were sent to pathology.   Casey Hamilton, M.D.

## 2013-12-04 NOTE — Addendum Note (Signed)
Addendum created 12/04/13 1643 by Elbert Ewingsolleen S Jiyaan Steinhauser, CRNA   Modules edited: Charges VN, Notes Section   Notes Section:  File: [id number]

## 2013-12-04 NOTE — Anesthesia Postprocedure Evaluation (Signed)
  Anesthesia Post-op Note  Patient: Casey Hamilton  Procedure(s) Performed: Procedure(s): POST PARTUM TUBAL LIGATION (Bilateral)  Patient Location: PACU and Mother/Baby  Anesthesia Type:Epidural  Level of Consciousness: awake, alert  and oriented  Airway and Oxygen Therapy: Patient Spontanous Breathing  Post-op Pain: mild  Post-op Assessment: Post-op Vital signs reviewed, Patient's Cardiovascular Status Stable, Respiratory Function Stable, No signs of Nausea or vomiting, Adequate PO intake, Pain level not controlled, No headache, No backache, No residual numbness and No residual motor weakness  Post-op Vital Signs: Reviewed and stable  Last Vitals:  Filed Vitals:   12/04/13 1508  BP: 120/78  Pulse: 84  Temp: 36.5 C  Resp: 18    Complications: No apparent anesthesia complications

## 2013-12-04 NOTE — Transfer of Care (Signed)
Immediate Anesthesia Transfer of Care Note  Patient: Casey Hamilton  Procedure(s) Performed: Procedure(s): POST PARTUM TUBAL LIGATION (Bilateral)  Patient Location: PACU  Anesthesia Type:Epidural  Level of Consciousness: sedated  Airway & Oxygen Therapy: Patient Spontanous Breathing and Patient connected to nasal cannula oxygen  Post-op Assessment: Report given to PACU RN  Post vital signs: Reviewed  Complications: No apparent anesthesia complications

## 2013-12-04 NOTE — Progress Notes (Signed)
Casey Hamilton  Post Partum Day 1:S/P SVD with bilateral labial lacerations  Subjective: Patient up ad lib, denies syncope or dizziness.  Reports consuming regular diet prior to NPO status without issues and denies N/V No issues with urination and reports bleeding is "fine." Feeding:  Breastfeeding Contraceptive plan:   PPBTL-Denies questions or concerns and reports "being ready to get it over with."  Objective: Blood pressure 120/77, pulse 93, temperature 98.1 F (36.7 C), temperature source Oral, resp. rate 18, height 5\' 6"  (1.676 m), weight 214 lb (97.07 kg), last menstrual period 03/07/2013, SpO2 98.00%, unknown if currently breastfeeding.  Physical Exam:  General: alert, cooperative and no distress Lochia: appropriate Uterine Fundus: firm, U/-1 Incision: N/A DVT Evaluation: No evidence of DVT seen on physical exam. Negative Homan's sign. No significant calf/ankle edema.   Recent Labs  12/03/13 0845 12/04/13 0615  HGB 10.0* 8.7*  HCT 31.2* 27.2*    Assessment S/P Vaginal Delivery-Day 1 Normal Involution Breastfeeding Asymptomatic Anemia  Plan: -BTL today -Orthostatic Vital Signs -Fe+ supplementation 325mg  BID -CBC in am to reassess -Continue other care as ordered   [facility] Hospital[facility], Jesiah Yerby LYNN, CNM 12/04/2013, 8:51 AM

## 2013-12-04 NOTE — Anesthesia Postprocedure Evaluation (Signed)
  Anesthesia Post-op Note  Patient: Casey Hamilton  Procedure(s) Performed: * No procedures listed *  Patient Location: PACU and Mother/Baby  Anesthesia Type:Epidural  Level of Consciousness: awake, alert  and oriented  Airway and Oxygen Therapy: Patient Spontanous Breathing  Post-op Pain: mild  Post-op Assessment: Post-op Vital signs reviewed, Patient's Cardiovascular Status Stable, Respiratory Function Stable, No signs of Nausea or vomiting, Pain level controlled, No headache, No backache, No residual numbness and No residual motor weakness  Post-op Vital Signs: Reviewed and stable  Last Vitals:  Filed Vitals:   12/04/13 0557  BP: 90/53  Pulse: 70  Temp: 36.7 C  Resp: 18    Complications: No apparent anesthesia complications

## 2013-12-04 NOTE — Anesthesia Postprocedure Evaluation (Signed)
Anesthesia Post Note  Patient: Casey Hamilton  Procedure(s) Performed: Procedure(s) (LRB): POST PARTUM TUBAL LIGATION (Bilateral)  Anesthesia type: Epidural  Patient location: PACU  Post pain: Pain level controlled  Post assessment: Post-op Vital signs reviewed  Last Vitals:  Filed Vitals:   12/04/13 1200  BP: 98/55  Pulse: 61  Temp:   Resp: 12    Post vital signs: Reviewed  Level of consciousness: awake  Complications: No apparent anesthesia complications

## 2013-12-04 NOTE — Lactation Note (Signed)
This note was copied from the chart of Casey Hamilton. Lactation Consultation Note  Patient Name: Casey Hamilton [id number]'XToday's Date: 12/04/2013 Reason for consult: Initial assessment  Initial consult.  GA - 38.5; BW- 8 lbs,11oz.  Mom had BTL this am and got back to room within past hour.  Infant has been asleep all day (10 hrs since last feed); mom could not get infant to feed prior to BTL. Infant has breastfed x6 since birth (from 1700-0450 in 12 hrs) now 822 hrs old.  Voids-1; stools-1.  Infant asleep in crib upon entering room.  [facility].  LC undressed infant and she began showing subtle feeding cues.  Assisted with latching infant in cross cradle position on left side.  Taught mom how to sandwich breast and use asymetrical latching technique to achieve depth .  Infant easily latched and maintained latch but lots stimulation needed to get her to suck; still very sleepy at breast.  Educated mom on size of infant's stomach, cluster feeding, and feeding cues.  Encouraged to feed with cues.  Lactation brochure given and informed of outpatient services and hospital / community support groups.  Encouraged to call for questions as needed.  Reported to RN of latch assistance.     Maternal Data Formula Feeding for Exclusion: No Infant to breast within first hour of birth: Yes Has patient been taught Hand Expression?: Yes Does the patient have breastfeeding experience prior to this delivery?: Yes  Feeding Feeding Type: Breast Fed  LATCH Score/Interventions Latch: Grasps breast easily, tongue down, lips flanged, rhythmical sucking. Intervention(s): Adjust position;Assist with latch;Breast compression  Audible Swallowing: A few with stimulation Intervention(s): Skin to skin  Type of Nipple: Everted at rest and after stimulation  Comfort (Breast/Nipple): Soft / non-tender     Hold (Positioning): Assistance needed to correctly position infant at breast and maintain  latch. Intervention(s): Breastfeeding basics reviewed;Support Pillows;Skin to skin  LATCH Score: 8  Lactation Tools Discussed/Used WIC Program: Yes   Consult Status Consult Status: Follow-up Date: 12/05/13 Follow-up type: In-patient    Lendon KaVann, Kindell Strada Walker 12/04/2013, 3:24 PM

## 2013-12-04 NOTE — Anesthesia Preprocedure Evaluation (Signed)
Anesthesia Evaluation  Patient identified by MRN, date of birth, ID band Patient awake    Reviewed: Allergy & Precautions, H&P , Patient's Chart, lab work & pertinent test results  Airway Mallampati: II TM Distance: >3 FB Neck ROM: full    Dental   Pulmonary  breath sounds clear to auscultation        Cardiovascular Rhythm:regular Rate:Normal     Neuro/Psych    GI/Hepatic   Endo/Other    Renal/GU      Musculoskeletal   Abdominal   Peds  Hematology   Anesthesia Other Findings   Reproductive/Obstetrics (+) Pregnancy                           Anesthesia Physical  Anesthesia Plan  ASA: II  Anesthesia Plan: Epidural   Post-op Pain Management:    Induction:   Airway Management Planned:   Additional Equipment:   Intra-op Plan:   Post-operative Plan:   Informed Consent: I have reviewed the patients History and Physical, chart, labs and discussed the procedure including the risks, benefits and alternatives for the proposed anesthesia with the patient or authorized representative who has indicated his/her understanding and acceptance.     Plan Discussed with:   Anesthesia Plan Comments: (Epidural from labor in-situ for PPTL.)        Anesthesia Quick Evaluation

## 2013-12-04 NOTE — Progress Notes (Signed)
Ur chart review completed.  

## 2013-12-05 ENCOUNTER — Encounter (HOSPITAL_COMMUNITY): Payer: Self-pay | Admitting: Obstetrics and Gynecology

## 2013-12-05 DIAGNOSIS — D649 Anemia, unspecified: Secondary | ICD-10-CM

## 2013-12-05 DIAGNOSIS — Z9851 Tubal ligation status: Secondary | ICD-10-CM

## 2013-12-05 HISTORY — DX: Tubal ligation status: Z98.51

## 2013-12-05 LAB — CBC
HCT: 27.9 % — ABNORMAL LOW (ref 36.0–46.0)
Hemoglobin: 8.6 g/dL — ABNORMAL LOW (ref 12.0–15.0)
MCH: 24 pg — ABNORMAL LOW (ref 26.0–34.0)
MCHC: 30.8 g/dL (ref 30.0–36.0)
MCV: 77.9 fL — ABNORMAL LOW (ref 78.0–100.0)
PLATELETS: 206 10*3/uL (ref 150–400)
RBC: 3.58 MIL/uL — AB (ref 3.87–5.11)
RDW: 15.7 % — ABNORMAL HIGH (ref 11.5–15.5)
WBC: 10.3 10*3/uL (ref 4.0–10.5)

## 2013-12-05 MED ORDER — FERROUS SULFATE 325 (65 FE) MG PO TABS: 325.0000 mg | ORAL_TABLET | Freq: Two times a day (BID) | ORAL | Status: DC

## 2013-12-05 MED ORDER — IBUPROFEN 600 MG PO TABS: 600.0000 mg | ORAL_TABLET | Freq: Four times a day (QID) | ORAL | Status: DC

## 2013-12-05 MED ORDER — OXYCODONE-ACETAMINOPHEN 5-325 MG PO TABS: 1.0000 | ORAL_TABLET | ORAL | Status: DC | PRN

## 2013-12-05 NOTE — Discharge Summary (Signed)
  Vaginal Delivery Discharge Summary  Casey Hamilton  DOB:    February 13, 1986 MRN:    [id number] CSN:    [id number]  Date of admission:                  12/03/13  Date of discharge:                   12/05/13  Procedures this admission:  VAVD w/BTL  Date of Delivery: 12/03/13  Newborn Data:  Live born female  Birth Weight: 8 lb 11.6 oz (3958 g) APGAR: 9, 9  Home with mother.   History of Present Illness:  Ms. Casey Hamilton is a 28 y.o. female, [phone], who presents at 146w5d weeks gestation. [facility] She was admitted onset of labor. Her pregnancy has been complicated by:  Patient Active Problem List   Diagnosis Date Noted  . Vaginal delivery 12/05/2013  . S/P tubal ligation 12/05/2013  . Anemia 12/05/2013  . Rectal bleeding - consult to GI; neg eval 12/03/2013  . Vaginal yeast infection - treated during pregnancy 12/03/2013     Hospital Course:  Admitted 12/03/13. Negative GBS. Progressed normally. Utilized epidural for pain management.  Delivery was performed by Dr Su Hiltoberts without complication. Patient and baby tolerated the procedure without difficulty, with 1st degree bilateral labial laceration noted. Infant status was stable and remained in room with mother.  Mother and infant then had an uncomplicated postpartum course, with breastfeeding going well. Mom's physical exam was WNL, and she was discharged home in stable condition. Contraception plan was BTL.  She received adequate benefit from po pain medications.   Feeding:  Breastfeeding  Contraception:  bilateral tubal ligation  Discharge hemoglobin:  Hemoglobin  Date Value Ref Range Status  12/05/2013 8.6* 12.0 - 15.0 g/dL Final     HCT  Date Value Ref Range Status  12/05/2013 27.9* 36.0 - 46.0 % Final     Discharge Physical Exam:   General: alert and cooperative Lochia:  appropriate Uterine Fundus: firm Incision: 1 degree is healing well, BTL DSG CDI   DVT Evaluation: No evidence of DVT seen on physical exam. Anemia - hemodynamically stable  Intrapartum Procedures: spontaneous vaginal delivery and vacuum Postpartum Procedures: P.P. tubal ligation Complications-Operative and Postpartum:1st degree bilateral labial laceration    Discharge Diagnoses: Term Pregnancy-delivered  Discharge Information:  Activity:           pelvic rest Diet:                routine Medications: PNV, Ibuprofen and Percocet Condition:      stable Instructions:  FU 6 week PP app't   Discharge to: home  Follow-up Information   [facility]. Schedule an appointment as soon as possible for a visit in 6 weeks. (As needed)    Specialty:  Obstetrics and Gynecology   Contact information:   [address] [place] [id number] [phone]       Dong Nimmons CNM, CNM 12/05/2013 7:57 AM

## 2013-12-05 NOTE — Lactation Note (Addendum)
This note was copied from the chart of Casey Hamilton. Lactation Consultation Note  Patient Name: Casey Hamilton NWGNF'AToday's Date: 12/05/2013 Reason for consult: Follow-up assessment Per mom breast feeding is going well. Mom denies sore nipples. And breast are feeling fuller. LC reviewed sore nipple and engorgement prevention and tx.per mom has pump at home  Mother informed of post-discharge support and given phone number to the lactation department, including services for phone call assistance; out-patient appointments; and breastfeeding support group. List of other breastfeeding resources in the community given in the handout. Encouraged mother to call for problems or concerns related to breastfeeding.   Maternal Data    Feeding Feeding Type:  (per mom recently try at the breast 5 mins ) Length of feed: 5 min  LATCH Score/Interventions                Intervention(s): Breastfeeding basics reviewed     Lactation Tools Discussed/Used Tools:  (per mom has a pump at home )   Consult Status Consult Status: Complete Date: 12/05/13    Kathrin Greathouseorio, Amrutha Avera Ann 12/05/2013, 11:21 AM

## 2013-12-05 NOTE — Discharge Instructions (Signed)
Breastfeeding °Deciding to breastfeed is one of the best choices you can make for you and your baby. A change in hormones during pregnancy causes your breast tissue to grow and increases the number and size of your milk ducts. These hormones also allow proteins, sugars, and fats from your blood supply to make breast milk in your milk-producing glands. Hormones prevent breast milk from being released before your baby is born as well as prompt milk flow after birth. Once breastfeeding has begun, thoughts of your baby, as well as his or her sucking or crying, can stimulate the release of milk from your milk-producing glands.  °BENEFITS OF BREASTFEEDING °For Your Baby °· Your first milk (colostrum) helps your baby's digestive system function better.   °· There are antibodies in your milk that help your baby fight off infections.   °· Your baby has a lower incidence of asthma, allergies, and sudden infant death syndrome.   °· The nutrients in breast milk are better for your baby than infant formulas and are designed uniquely for your baby's needs.   °· Breast milk improves your baby's brain development.   °· Your baby is less likely to develop other conditions, such as childhood obesity, asthma, or type 2 diabetes mellitus.   °For You  °· Breastfeeding helps to create a very special bond between you and your baby.   °· Breastfeeding is convenient. Breast milk is always available at the correct temperature and costs nothing.   °· Breastfeeding helps to burn calories and helps you lose the weight gained during pregnancy.   °· Breastfeeding makes your uterus contract to its prepregnancy size faster and slows bleeding (lochia) after you give birth.   °· Breastfeeding helps to lower your risk of developing type 2 diabetes mellitus, osteoporosis, and breast or ovarian cancer later in life. °SIGNS THAT YOUR BABY IS HUNGRY °Early Signs of Hunger  °· Increased alertness or activity. °· Stretching. °· Movement of the head from  side to side. °· Movement of the head and opening of the mouth when the corner of the mouth or cheek is stroked (rooting). °· Increased sucking sounds, smacking lips, cooing, sighing, or squeaking. °· Hand-to-mouth movements. °· Increased sucking of fingers or hands. °Late Signs of Hunger °· Fussing. °· Intermittent crying. °Extreme Signs of Hunger °Signs of extreme hunger will require calming and consoling before your baby will be able to breastfeed successfully. Do not wait for the following signs of extreme hunger to occur before you initiate breastfeeding:   °· Restlessness. °· A loud, strong cry. °·  Screaming. °BREASTFEEDING BASICS °Breastfeeding Initiation °· Find a comfortable place to sit or lie down, with your neck and back well supported. °· Place a pillow or rolled up blanket under your baby to bring him or her to the level of your breast (if you are seated). Nursing pillows are specially designed to help support your arms and your baby while you breastfeed. °· Make sure that your baby's abdomen is facing your abdomen.   °· Gently massage your breast. With your fingertips, massage from your chest wall toward your nipple in a circular motion. This encourages milk flow. You may need to continue this action during the feeding if your milk flows slowly. °· Support your breast with 4 fingers underneath and your thumb above your nipple. Make sure your fingers are well away from your nipple and your baby's mouth.   °· Stroke your baby's lips gently with your finger or nipple.   °· When your baby's mouth is open wide enough, quickly bring your baby to your   breast, placing your entire nipple and as much of the colored area around your nipple (areola) as possible into your baby's mouth.   °¨ More areola should be visible above your baby's upper lip than below the lower lip.   °¨ Your baby's tongue should be between his or her lower gum and your breast.   °· Ensure that your baby's mouth is correctly positioned  around your nipple (latched). Your baby's lips should create a seal on your breast and be turned out (everted). °· It is common for your baby to suck about 2-3 minutes in order to start the flow of breast milk. °Latching °Teaching your baby how to latch on to your breast properly is very important. An improper latch can cause nipple pain and decreased milk supply for you and poor weight gain in your baby. Also, if your baby is not latched onto your nipple properly, he or she may swallow some air during feeding. This can make your baby fussy. Burping your baby when you switch breasts during the feeding can help to get rid of the air. However, teaching your baby to latch on properly is still the best way to prevent fussiness from swallowing air while breastfeeding. °Signs that your baby has successfully latched on to your nipple:    °· Silent tugging or silent sucking, without causing you pain.   °· Swallowing heard between every 3-4 sucks.   °·  Muscle movement above and in front of his or her ears while sucking.   °Signs that your baby has not successfully latched on to nipple:  °· Sucking sounds or smacking sounds from your baby while breastfeeding. °· Nipple pain. °If you think your baby has not latched on correctly, slip your finger into the corner of your baby's mouth to break the suction and place it between your baby's gums. Attempt breastfeeding initiation again. °Signs of Successful Breastfeeding °Signs from your baby:   °· A gradual decrease in the number of sucks or complete cessation of sucking.   °· Falling asleep.   °· Relaxation of his or her body.   °· Retention of a small amount of milk in his or her mouth.   °· Letting go of your breast by himself or herself. °Signs from you: °· Breasts that have increased in firmness, weight, and size 1-3 hours after feeding.   °· Breasts that are softer immediately after breastfeeding. °· Increased milk volume, as well as a change in milk consistency and color by  the fifth day of breastfeeding.   °· Nipples that are not sore, cracked, or bleeding. °Signs That Your Baby is Getting Enough Milk °· Wetting at least 3 diapers in a 24-hour period. The urine should be clear and pale yellow by age 5 days. °· At least 3 stools in a 24-hour period by age 5 days. The stool should be soft and yellow. °· At least 3 stools in a 24-hour period by age 7 days. The stool should be seedy and yellow. °· No loss of weight greater than 10% of birth weight during the first 3 days of age. °· Average weight gain of 4-7 ounces (113-198 g) per week after age 4 days. °· Consistent daily weight gain by age 5 days, without weight loss after the age of 2 weeks. °After a feeding, your baby may spit up a small amount. This is common. °BREASTFEEDING FREQUENCY AND DURATION °Frequent feeding will help you make more milk and can prevent sore nipples and breast engorgement. Breastfeed when you feel the need to reduce the fullness of your breasts   or when your baby shows signs of hunger. This is called "breastfeeding on demand." Avoid introducing a pacifier to your baby while you are working to establish breastfeeding (the first 4-6 weeks after your baby is born). After this time you may choose to use a pacifier. Research has shown that pacifier use during the first year of a baby's life decreases the risk of sudden infant death syndrome (SIDS). °Allow your baby to feed on each breast as long as he or she wants. Breastfeed until your baby is finished feeding. When your baby unlatches or falls asleep while feeding from the first breast, offer the second breast. Because newborns are often sleepy in the first few weeks of life, you may need to awaken your baby to get him or her to feed. °Breastfeeding times will vary from baby to baby. However, the following rules can serve as a guide to help you ensure that your baby is properly fed: °· Newborns (babies 4 weeks of age or younger) may breastfeed every 1-3  hours. °· Newborns should not go longer than 3 hours during the day or 5 hours during the night without breastfeeding. °· You should breastfeed your baby a minimum of 8 times in a 24-hour period until you begin to introduce solid foods to your baby at around 6 months of age. °BREAST MILK PUMPING °Pumping and storing breast milk allows you to ensure that your baby is exclusively fed your breast milk, even at times when you are unable to breastfeed. This is especially important if you are going back to work while you are still breastfeeding or when you are not able to be present during feedings. Your lactation consultant can give you guidelines on how long it is safe to store breast milk.  °A breast pump is a machine that allows you to pump milk from your breast into a sterile bottle. The pumped breast milk can then be stored in a refrigerator or freezer. Some breast pumps are operated by hand, while others use electricity. Ask your lactation consultant which type will work best for you. Breast pumps can be purchased, but some hospitals and breastfeeding support groups lease breast pumps on a monthly basis. A lactation consultant can teach you how to hand express breast milk, if you prefer not to use a pump.  °CARING FOR YOUR BREASTS WHILE YOU BREASTFEED °Nipples can become dry, cracked, and sore while breastfeeding. The following recommendations can help keep your breasts moisturized and healthy: °· Avoid using soap on your nipples.   °· Wear a supportive bra. Although not required, special nursing bras and tank tops are designed to allow access to your breasts for breastfeeding without taking off your entire bra or top. Avoid wearing underwire-style bras or extremely tight bras. °· Air dry your nipples for 3-4 minutes after each feeding.   °· Use only cotton bra pads to absorb leaked breast milk. Leaking of breast milk between feedings is normal.   °· Use lanolin on your nipples after breastfeeding. Lanolin helps to  maintain your skin's normal moisture barrier. If you use pure lanolin, you do not need to wash it off before feeding your baby again. Pure lanolin is not toxic to your baby. You may also hand express a few drops of breast milk and gently massage that milk into your nipples and allow the milk to air dry. °In the first few weeks after giving birth, some women experience extremely full breasts (engorgement). Engorgement can make your breasts feel heavy, warm, and tender to the   touch. Engorgement peaks within 3-5 days after you give birth. The following recommendations can help ease engorgement: °· Completely empty your breasts while breastfeeding or pumping. You may want to start by applying warm, moist heat (in the shower or with warm water-soaked hand towels) just before feeding or pumping. This increases circulation and helps the milk flow. If your baby does not completely empty your breasts while breastfeeding, pump any extra milk after he or she is finished. °· Wear a snug bra (nursing or regular) or tank top for 1-2 days to signal your body to slightly decrease milk production. °· Apply ice packs to your breasts, unless this is too uncomfortable for you. °· Make sure that your baby is latched on and positioned properly while breastfeeding. °If engorgement persists after 48 hours of following these recommendations, contact your health care provider or a lactation consultant. °OVERALL HEALTH CARE RECOMMENDATIONS WHILE BREASTFEEDING °· Eat healthy foods. Alternate between meals and snacks, eating 3 of each per day. Because what you eat affects your breast milk, some of the foods may make your baby more irritable than usual. Avoid eating these foods if you are sure that they are negatively affecting your baby. °· Drink milk, fruit juice, and water to satisfy your thirst (about 10 glasses a day).   °· Rest often, relax, and continue to take your prenatal vitamins to prevent fatigue, stress, and anemia. °· Continue  breast self-awareness checks. °· Avoid chewing and smoking tobacco. °· Avoid alcohol and drug use. °Some medicines that may be harmful to your baby can pass through breast milk. It is important to ask your health care provider before taking any medicine, including all over-the-counter and prescription medicine as well as vitamin and herbal supplements. °It is possible to become pregnant while breastfeeding. If birth control is desired, ask your health care provider about options that will be safe for your baby. °SEEK MEDICAL CARE IF:  °· You feel like you want to stop breastfeeding or have become frustrated with breastfeeding. °· You have painful breasts or nipples. °· Your nipples are cracked or bleeding. °· Your breasts are red, tender, or warm. °· You have a swollen area on either breast. °· You have a fever or chills. °· You have nausea or vomiting. °· You have drainage other than breast milk from your nipples. °· Your breasts do not become full before feedings by the fifth day after you give birth. °· You feel sad and depressed. °· Your baby is too sleepy to eat well. °· Your baby is having trouble sleeping.   °· Your baby is wetting less than 3 diapers in a 24-hour period. °· Your baby has less than 3 stools in a 24-hour period. °· Your baby's skin or the white part of his or her eyes becomes yellow.   °· Your baby is not gaining weight by 5 days of age. °SEEK IMMEDIATE MEDICAL CARE IF:  °· Your baby is overly tired (lethargic) and does not want to wake up and feed. °· Your baby develops an unexplained fever. °Document Released: 05/23/2005 Document Revised: 05/28/2013 Document Reviewed: 11/14/2012 °Exit[employer]® Patient Information ©2015 [employer]. This information is not intended to replace advice given to you by your health care provider. Make sure you discuss any questions you have with your health care provider. ° ° °Postpartum Care After Vaginal Delivery °After you deliver your newborn (postpartum  period), the usual stay in the hospital is 24-72 hours. If there were problems with your labor or delivery, or if   you have other medical problems, you might be in the hospital longer.  °While you are in the hospital, you will receive help and instructions on how to care for yourself and your newborn during the postpartum period.  °While you are in the hospital: °· Be sure to tell your nurses if you have pain or discomfort, as well as where you feel the pain and what makes the pain worse. °· If you had an incision made near your vagina (episiotomy) or if you had some tearing during delivery, the nurses may put ice packs on your episiotomy or tear. The ice packs may help to reduce the pain and swelling. °· If you are breastfeeding, you may feel uncomfortable contractions of your uterus for a couple of weeks. This is normal. The contractions help your uterus get back to normal size. °· It is normal to have some bleeding after delivery. °· For the first 1-3 days after delivery, the flow is red and the amount may be similar to a period. °· It is common for the flow to start and stop. °· In the first few days, you may pass some small clots. Let your nurses know if you begin to pass large clots or your flow increases. °· Do not  flush blood clots down the toilet before having the nurse look at them. °· During the next 3-10 days after delivery, your flow should become more watery and pink or brown-tinged in color. °· Ten to fourteen days after delivery, your flow should be a small amount of yellowish-white discharge. °· The amount of your flow will decrease over the first few weeks after delivery. Your flow may stop in 6-8 weeks. Most women have had their flow stop by 12 weeks after delivery. °· You should change your sanitary pads frequently. °· Wash your hands thoroughly with soap and water for at least 20 seconds after changing pads, using the toilet, or before holding or feeding your newborn. °· You should feel like you  need to empty your bladder within the first 6-8 hours after delivery. °· In case you become weak, lightheaded, or faint, call your nurse before you get out of bed for the first time and before you take a shower for the first time. °· Within the first few days after delivery, your breasts may begin to feel tender and full. This is called engorgement. Breast tenderness usually goes away within 48-72 hours after engorgement occurs. You may also notice milk leaking from your breasts. If you are not breastfeeding, do not stimulate your breasts. Breast stimulation can make your breasts produce more milk. °· Spending as much time as possible with your newborn is very important. During this time, you and your newborn can feel close and get to know each other. Having your newborn stay in your room (rooming in) will help to strengthen the bond with your newborn.  It will give you time to get to know your newborn and become comfortable caring for your newborn. °· Your hormones change after delivery. Sometimes the hormone changes can temporarily cause you to feel sad or tearful. These feelings should not last more than a few days. If these feelings last longer than that, you should talk to your caregiver. °· If desired, talk to your caregiver about methods of family planning or contraception. °· Talk to your caregiver about immunizations. Your caregiver may want you to have the following immunizations before leaving the hospital: °· Tetanus, diphtheria, and pertussis (Tdap) or tetanus and diphtheria (Td)   immunization. It is very important that you and your family (including grandparents) or others caring for your newborn are up-to-date with the Tdap or Td immunizations. The Tdap or Td immunization can help protect your newborn from getting ill. °· Rubella immunization. °· Varicella (chickenpox) immunization. °· Influenza immunization. You should receive this annual immunization if you did not receive the immunization during  your pregnancy. °Document Released: 03/20/2007 Document Revised: 02/15/2012 Document Reviewed: 01/18/2012 °ExitCare® Patient Information ©2015 [employer]. This information is not intended to replace advice given to you by your health care provider. Make sure you discuss any questions you have with your health care provider. ° °

## 2014-04-07 ENCOUNTER — Encounter (HOSPITAL_COMMUNITY): Payer: Self-pay | Admitting: Obstetrics and Gynecology

## 2014-10-15 ENCOUNTER — Emergency Department (HOSPITAL_COMMUNITY)
Admission: EM | Admit: 2014-10-15 | Discharge: 2014-10-15 | Disposition: A | Discharge status: Home / Self Care | Payer: Medicaid Other | Attending: Emergency Medicine | Admitting: Emergency Medicine

## 2014-10-15 ENCOUNTER — Encounter (HOSPITAL_COMMUNITY): Payer: Self-pay | Admitting: *Deleted

## 2014-10-15 DIAGNOSIS — R51 Headache: Secondary | ICD-10-CM | POA: Insufficient documentation

## 2014-10-15 DIAGNOSIS — Z872 Personal history of diseases of the skin and subcutaneous tissue: Secondary | ICD-10-CM | POA: Insufficient documentation

## 2014-10-15 DIAGNOSIS — Z880 Allergy status to penicillin: Secondary | ICD-10-CM | POA: Insufficient documentation

## 2014-10-15 DIAGNOSIS — R519 Headache, unspecified: Secondary | ICD-10-CM

## 2014-10-15 MED ORDER — SODIUM CHLORIDE 0.9 % IV BOLUS (SEPSIS)
1000.0000 mL | Freq: Once | INTRAVENOUS | Status: AC
  Administered 2014-10-15: 1000 mL via INTRAVENOUS

## 2014-10-15 MED ORDER — DIPHENHYDRAMINE HCL 50 MG/ML IJ SOLN
25.0000 mg | Freq: Once | INTRAMUSCULAR | Status: AC
  Administered 2014-10-15: 25 mg via INTRAVENOUS
  Filled 2014-10-15: qty 1

## 2014-10-15 MED ORDER — METOCLOPRAMIDE HCL 5 MG/ML IJ SOLN
10.0000 mg | Freq: Once | INTRAMUSCULAR | Status: AC
  Administered 2014-10-15: 10 mg via INTRAVENOUS
  Filled 2014-10-15: qty 2

## 2014-10-15 MED ORDER — KETOROLAC TROMETHAMINE 30 MG/ML IJ SOLN
30.0000 mg | Freq: Once | INTRAMUSCULAR | Status: AC
  Administered 2014-10-15: 30 mg via INTRAVENOUS
  Filled 2014-10-15: qty 1

## 2014-10-15 MED ORDER — BUTALBITAL-APAP-CAFFEINE 50-325-40 MG PO TABS: 1.0000 | ORAL_TABLET | Freq: Four times a day (QID) | ORAL | Status: AC | PRN

## 2014-10-15 NOTE — Discharge Instructions (Signed)

## 2014-10-15 NOTE — ED Notes (Signed)
Pt sates that she has had a headache for 3 days. Pt reports sensitivity to light and seeing "specks" in front of her eyes. No neuro deficits in triage.

## 2014-10-15 NOTE — ED Provider Notes (Signed)
CSN: [id number]     Arrival date & time 10/15/14  1553 History   First MD Initiated Contact with Patient 10/15/14 1944     Chief Complaint  Patient presents with  . Headache     (Consider location/radiation/quality/duration/timing/severity/associated sxs/prior Treatment) HPI   29 year old female presents for evaluation of headache. Patient reports gradual onset of headache for the past 3 days. She described headaches as a pounding sensation across her forehead, persistent, with light and sound sensitivity, occasional spots in her vision and nausea.She has tried taking Tylenol the first day,ibuprofen the second day and Excedrin migraine headache along with ibuprofen today with minimal improvement. She rates the headache as 9 out of 10. She denies any prior history of migraine. She denies having fever, chills, double vision, neck stiffness, vomiting or diarrhea, chest pain, shortness of breath, URI symptoms, abdominal pain, back pain, numbness or weakness, or rash. She denies any change in her daily activities, no new medications or any stressor. She is currently not pregnant, has prior tubal ligation. She is here in the ER accompanied by her 3 young children and husband.  Past Medical History  Diagnosis Date  . Contact dermatitis   . Medical history non-contributory    Past Surgical History  Procedure Laterality Date  . Wisdom tooth extraction    . Foot surgery Right   . Tubal ligation Bilateral 12/04/2013    Procedure: POST PARTUM TUBAL LIGATION;  Surgeon: Kirkland HunArthur Stringer, MD;  Location: [facility];  Service: Gynecology;  Laterality: Bilateral;   Family History  Problem Relation Age of Onset  . Hypertension Mother   . COPD Maternal Grandfather   . Diabetes Maternal Grandfather   . Epilepsy Paternal Grandmother   . Breast cancer Maternal Aunt   . Prostate cancer Maternal Uncle    History  Substance Use Topics  . Smoking status: Never Smoker   . Smokeless tobacco: Never Used  . Alcohol  Use: Yes     Comment: cocktail every few months   OB History    Gravida Para Term Preterm AB TAB SAB Ectopic Multiple Living   3 3 3       3      Review of Systems  All other systems reviewed and are negative.     Allergies  Amoxicillin  Home Medications   Prior to Admission medications   Medication Sig Start Date End Date Taking? Authorizing Provider  ferrous sulfate 325 (65 FE) MG tablet Take 1 tablet (325 mg total) by mouth 2 (two) times daily with a meal. Patient not taking: Reported on 10/15/2014 12/05/13   Venus Standard, CNM  ibuprofen (ADVIL,MOTRIN) 600 MG tablet Take 1 tablet (600 mg total) by mouth every 6 (six) hours. Patient not taking: Reported on 10/15/2014 12/05/13   Venus Standard, CNM  oxyCODONE-acetaminophen (PERCOCET/ROXICET) 5-325 MG per tablet Take 1-2 tablets by mouth every 4 (four) hours as needed for severe pain (moderate - severe pain). Patient not taking: Reported on 10/15/2014 12/05/13   Venus Standard, CNM   BP 111/84 mmHg  Pulse 73  Temp(Src) 98.1 F (36.7 C) (Oral)  Resp 18  Ht 5\' 5"  (1.651 m)  Wt 193 lb (87.544 kg)  BMI 32.12 kg/m2  SpO2 99% Physical Exam  Constitutional: She is oriented to person, place, and time. She appears well-developed and well-nourished. No distress.  HENT:  Head: Atraumatic.  Eyes: Conjunctivae are normal.  Neck: Neck supple.  No nuchal rigidity  Cardiovascular: Normal rate and regular rhythm.   Pulmonary/Chest: Effort  normal and breath sounds normal.  Abdominal: Soft. There is no tenderness.  Neurological: She is alert and oriented to person, place, and time.  Neurologic exam:  Speech clear, pupils equal round reactive to light, extraocular movements intact  Normal peripheral visual fields Cranial nerves III through XII normal including no facial droop Follows commands, moves all extremities x4, normal strength to bilateral upper and lower extremities at all major muscle groups including grip Sensation normal to  light touch  Coordination intact, no limb ataxia, finger-nose-finger normal Rapid alternating movements normal No pronator drift Gait normal   Skin: No rash noted.  Psychiatric: She has a normal mood and affect.  Nursing note and vitals reviewed.   ED Course  Procedures (including critical care time)  Pt presenting with headache.  No fever or nuchal rigidity concerning for meningitis. No acute onset thunderclap headache concerning for subarachnoid hemorrhage. No focal neuro deficits concerning for mass or stroke. Migraine cocktail given. Will monitor closely.  10:02 PM Headache improves with migraine cocktail. Pt stable for discharge.  Fioricet prescribed to use as needed. Work note provided as requested.  Labs Review Labs Reviewed - No data to display  Imaging Review No results found.   EKG Interpretation None      MDM   Final diagnoses:  Bad headache    BP 143/80 mmHg  Pulse 76  Temp(Src) 98.1 F (36.7 C) (Oral)  Resp 16  Ht 5\' 5"  (1.651 m)  Wt 193 lb (87.544 kg)  BMI 32.12 kg/m2  SpO2 100%     Fayrene HelperBowie Elzie Sheets, PA-C 10/15/14 2203  Raeford RazorStephen Kohut, MD 10/16/14 1623

## 2014-11-19 ENCOUNTER — Encounter (HOSPITAL_COMMUNITY): Payer: Self-pay | Admitting: Emergency Medicine

## 2014-11-19 ENCOUNTER — Emergency Department (HOSPITAL_COMMUNITY)
Admission: EM | Admit: 2014-11-19 | Discharge: 2014-11-19 | Disposition: A | Discharge status: Home / Self Care | Payer: Medicaid Other | Attending: Emergency Medicine | Admitting: Emergency Medicine

## 2014-11-19 ENCOUNTER — Emergency Department (HOSPITAL_COMMUNITY): Payer: Medicaid Other

## 2014-11-19 DIAGNOSIS — Z880 Allergy status to penicillin: Secondary | ICD-10-CM | POA: Insufficient documentation

## 2014-11-19 DIAGNOSIS — Z87898 Personal history of other specified conditions: Secondary | ICD-10-CM | POA: Insufficient documentation

## 2014-11-19 DIAGNOSIS — R0789 Other chest pain: Secondary | ICD-10-CM

## 2014-11-19 DIAGNOSIS — Z872 Personal history of diseases of the skin and subcutaneous tissue: Secondary | ICD-10-CM | POA: Insufficient documentation

## 2014-11-19 LAB — I-STAT TROPONIN, ED: Troponin i, poc: 0.01 ng/mL (ref 0.00–0.08)

## 2014-11-19 LAB — CBC
HEMATOCRIT: 36 % (ref 36.0–46.0)
HEMOGLOBIN: 11.7 g/dL — AB (ref 12.0–15.0)
MCH: 26.2 pg (ref 26.0–34.0)
MCHC: 32.5 g/dL (ref 30.0–36.0)
MCV: 80.7 fL (ref 78.0–100.0)
Platelets: 238 10*3/uL (ref 150–400)
RBC: 4.46 MIL/uL (ref 3.87–5.11)
RDW: 14.5 % (ref 11.5–15.5)
WBC: 10.2 10*3/uL (ref 4.0–10.5)

## 2014-11-19 LAB — BASIC METABOLIC PANEL
Anion gap: 8 (ref 5–15)
BUN: 7 mg/dL (ref 6–20)
CALCIUM: 9.1 mg/dL (ref 8.9–10.3)
CHLORIDE: 104 mmol/L (ref 101–111)
CO2: 26 mmol/L (ref 22–32)
CREATININE: 0.68 mg/dL (ref 0.44–1.00)
GFR calc Af Amer: >60 mL/min (ref >60)
GFR calc non Af Amer: >60 mL/min (ref >60)
Glucose, Bld: 108 mg/dL — ABNORMAL HIGH (ref 65–99)
Potassium: 3.5 mmol/L (ref 3.5–5.1)
Sodium: 138 mmol/L (ref 135–145)

## 2014-11-19 MED ORDER — OMEPRAZOLE 20 MG PO CPDR: 20.0000 mg | DELAYED_RELEASE_CAPSULE | Freq: Every day | ORAL | Status: DC

## 2014-11-19 NOTE — ED Provider Notes (Signed)
CSN: [id number]     Arrival date & time 11/19/14  1191 History   First MD Initiated Contact with Patient 11/19/14 0444     Chief Complaint  Patient presents with  . Chest Pain     (Consider location/radiation/quality/duration/timing/severity/associated sxs/prior Treatment) HPI Pt is a 29yo female presenting to ED with c/o centralized chest tightness that started last night around 11PM, has been constant since onset, worse with lying down.  Pain is 7/10 at worst, does not radiate.  She has not tried anything for pain.  Denies diaphoresis, nausea or vomiting.  Denies prior hx of CAD. Denies FH of CAD. States she is otherwise healthy. No daily medications.  States she does have a hx of acid reflux but states that is usually a burning sensation not pressure. Denies recent illness of cough or congestion. Denies SOB. Denies leg pain or swelling. Denies hx of DVT or PE. Not on birth control.   Past Medical History  Diagnosis Date  . Contact dermatitis   . Medical history non-contributory    Past Surgical History  Procedure Laterality Date  . Wisdom tooth extraction    . Foot surgery Right   . Tubal ligation Bilateral 12/04/2013    Procedure: POST PARTUM TUBAL LIGATION;  Surgeon: Kirkland Hun, MD;  Location: [facility];  Service: Gynecology;  Laterality: Bilateral;   Family History  Problem Relation Age of Onset  . Hypertension Mother   . COPD Maternal Grandfather   . Diabetes Maternal Grandfather   . Epilepsy Paternal Grandmother   . Breast cancer Maternal Aunt   . Prostate cancer Maternal Uncle    History  Substance Use Topics  . Smoking status: Never Smoker   . Smokeless tobacco: Never Used  . Alcohol Use: Yes     Comment: cocktail every few months   OB History    Gravida Para Term Preterm AB TAB SAB Ectopic Multiple Living   Review of Systems  Constitutional: Negative for fever, chills, diaphoresis, appetite change and fatigue.  Respiratory: Negative for  cough and shortness of breath.   Cardiovascular: Positive for chest pain. Negative for palpitations and leg swelling.  Gastrointestinal: Negative for nausea, vomiting, abdominal pain and diarrhea.  All other systems reviewed and are negative.     Allergies  Amoxicillin  Home Medications   Prior to Admission medications   Medication Sig Start Date End Date Taking? Authorizing Provider  butalbital-acetaminophen-caffeine (FIORICET) [phone] MG per tablet Take 1-2 tablets by mouth every 6 (six) hours as needed for headache. 10/15/14 10/15/15 Yes Fayrene Helper, PA-C  omeprazole (PRILOSEC) 20 MG capsule Take 1 capsule (20 mg total) by mouth daily. 11/19/14   Junius Finner, PA-C   BP 128/82 mmHg  Pulse 90  Temp(Src) 98.2 F (36.8 C) (Oral)  Resp 16  Ht  (1.676 m)  Wt 198 lb (89.812 kg)  BMI 31.97 kg/m2  SpO2 99%  LMP 10/20/2014 (Exact Date) Physical Exam  Constitutional: She appears well-developed and well-nourished. No distress.  HENT:  Head: Normocephalic and atraumatic.  Eyes: Conjunctivae are normal. No scleral icterus.  Neck: Normal range of motion.  Cardiovascular: Normal rate, regular rhythm and normal heart sounds.   Pulmonary/Chest: Effort normal and breath sounds normal. No respiratory distress. She has no wheezes. She has no rales. She exhibits no tenderness.  Abdominal: Soft. Bowel sounds are normal. She exhibits no distension and no mass. There is no tenderness. There  is no rebound and no guarding.  Musculoskeletal: Normal range of motion.  Neurological: She is alert.  Skin: Skin is warm and dry. She is not diaphoretic.  Nursing note and vitals reviewed.   ED Course  Procedures (including critical care time) Labs Review Labs Reviewed  CBC - Abnormal; Notable for the following:    Hemoglobin 11.7 (*)    All other components within normal limits  BASIC METABOLIC PANEL - Abnormal; Notable for the following:    Glucose, Bld 108 (*)    All other components within  normal limits  Rosezena Sensor, ED    Imaging Review Dg Chest 2 View  11/19/2014   CLINICAL DATA:  Centralized chest pressure tonight. Difficulty catching breath.  EXAM: CHEST  2 VIEW  COMPARISON:  11/10/2009  FINDINGS: The heart size and mediastinal contours are within normal limits. Both lungs are clear. The visualized skeletal structures are unremarkable.  IMPRESSION: No active cardiopulmonary disease.   Electronically Signed   By: Burman Nieves M.D.   On: 11/19/2014 04:16     EKG Interpretation   Date/Time:  Wednesday November 19 2014 03:46:27 EDT Ventricular Rate:  89 PR Interval:  146 QRS Duration: 72 QT Interval:  366 QTC Calculation: 445 R Axis:   63 Text Interpretation:  Normal sinus rhythm Normal ECG Confirmed by OTTER   MD, OLGA ([id number]) on 11/19/2014 5:22:00 AM      MDM   Final diagnoses:  Other chest pain    Pt presenting to ED with constant centralized CP for last 6 hours. Worse with lying down. Pt low risk for major cardiac event per HEART score.  CP atypical for ACS.  PERC negative.  Troponin in ED, negative for elevation.   EKG: normal Discussed pt with Dr. Norlene Campbell, pt is safe for discharge home. Home care instructions provided. Rx: omeprazole as pt has hx of GERD. Advised to f/u with PCP if symptoms not improving. Return precautions provided. Pt verbalized understanding and agreement with tx plan.     Junius Finner, PA-C 11/19/14 0545   Junius Finner, PA-C 11/19/14 1914  Marisa Severin, MD 11/19/14 [phone]

## 2014-11-19 NOTE — ED Notes (Signed)
I-stat troponin results 0.01

## 2014-11-19 NOTE — ED Notes (Signed)
Patient here with chest tightness which began last night around 2300. Denies history of similar. Worsens with laying down. Denies other anginal equivalents.

## 2015-12-27 ENCOUNTER — Encounter (HOSPITAL_COMMUNITY): Payer: Self-pay | Admitting: Emergency Medicine

## 2015-12-27 ENCOUNTER — Emergency Department (HOSPITAL_COMMUNITY)
Admission: EM | Admit: 2015-12-27 | Discharge: 2015-12-28 | Disposition: A | Discharge status: Home / Self Care | Payer: Managed Care, Other (non HMO) | Attending: Emergency Medicine | Admitting: Emergency Medicine

## 2015-12-27 DIAGNOSIS — R1030 Lower abdominal pain, unspecified: Secondary | ICD-10-CM | POA: Diagnosis present

## 2015-12-27 DIAGNOSIS — R102 Pelvic and perineal pain: Secondary | ICD-10-CM | POA: Diagnosis not present

## 2015-12-27 LAB — URINE MICROSCOPIC-ADD ON
RBC / HPF: NONE SEEN RBC/hpf (ref 0–5)
WBC, UA: NONE SEEN WBC/hpf (ref 0–5)

## 2015-12-27 LAB — COMPREHENSIVE METABOLIC PANEL
ALT: 15 U/L (ref 14–54)
AST: 14 U/L — AB (ref 15–41)
Albumin: 3.9 g/dL (ref 3.5–5.0)
Alkaline Phosphatase: 71 U/L (ref 38–126)
Anion gap: 6 (ref 5–15)
BILIRUBIN TOTAL: 0.4 mg/dL (ref 0.3–1.2)
BUN: 11 mg/dL (ref 6–20)
CHLORIDE: 105 mmol/L (ref 101–111)
CO2: 27 mmol/L (ref 22–32)
Calcium: 9.3 mg/dL (ref 8.9–10.3)
Creatinine, Ser: 0.71 mg/dL (ref 0.44–1.00)
GFR calc Af Amer: >60 mL/min (ref >60)
GFR calc non Af Amer: >60 mL/min (ref >60)
GLUCOSE: 88 mg/dL (ref 65–99)
Potassium: 3.5 mmol/L (ref 3.5–5.1)
Sodium: 138 mmol/L (ref 135–145)
TOTAL PROTEIN: 7.5 g/dL (ref 6.5–8.1)

## 2015-12-27 LAB — LIPASE, BLOOD: Lipase: 36 U/L (ref 11–51)

## 2015-12-27 LAB — CBC
HEMATOCRIT: 38.3 % (ref 36.0–46.0)
Hemoglobin: 11.8 g/dL — ABNORMAL LOW (ref 12.0–15.0)
MCH: 25.7 pg — ABNORMAL LOW (ref 26.0–34.0)
MCHC: 30.8 g/dL (ref 30.0–36.0)
MCV: 83.4 fL (ref 78.0–100.0)
Platelets: 247 10*3/uL (ref 150–400)
RBC: 4.59 MIL/uL (ref 3.87–5.11)
RDW: 14.4 % (ref 11.5–15.5)
WBC: 9.4 10*3/uL (ref 4.0–10.5)

## 2015-12-27 LAB — URINALYSIS, ROUTINE W REFLEX MICROSCOPIC
BILIRUBIN URINE: NEGATIVE
Glucose, UA: NEGATIVE mg/dL
Hgb urine dipstick: NEGATIVE
Ketones, ur: 15 mg/dL — AB
NITRITE: NEGATIVE
PH: 6.5 (ref 5.0–8.0)
Protein, ur: NEGATIVE mg/dL
SPECIFIC GRAVITY, URINE: 1.026 (ref 1.005–1.030)

## 2015-12-27 LAB — POC URINE PREG, ED: PREG TEST UR: NEGATIVE

## 2015-12-27 NOTE — ED Provider Notes (Signed)
[facility]-EMERGENCY DEPT Provider Note   CSN: [id number] Arrival date & time: 12/27/15  2038  First Provider Contact:  None    By signing my name below, I, Freida Busman, attest that this documentation has been prepared under the direction and in the presence of Derwood Kaplan, MD . Electronically Signed: Freida Busman, Scribe. 12/27/2015. 11:59 PM.  History   Chief Complaint Chief Complaint  Patient presents with  . Abdominal Pain   The history is provided by the patient. No language interpreter was used.     HPI Comments:  Casey Hamilton is a 30 y.o. female who presents to the Emergency Department complaining of intermittent, lower abdominal pain x 2 weeks. She describe her pain as sharp and cramping and notes episodes have been more frequent in the last few days. Pt notes associated white vaginal discharge. No alleviating factors noted. She denies dysuria and hematuria. She also denies h/o STD and uterine disorders. Her LNMP was November 27, 2014; she also notes h/o tubal ligation    Past Medical History:  Diagnosis Date  . Contact dermatitis   . Medical history non-contributory     Patient Active Problem List   Diagnosis Date Noted  . Vaginal delivery 12/05/2013  . S/P tubal ligation 12/05/2013  . Anemia 12/05/2013  . Rectal bleeding - consult to GI; neg eval 12/03/2013  . Vaginal yeast infection - treated during pregnancy 12/03/2013    Past Surgical History:  Procedure Laterality Date  . FOOT SURGERY Right   . TUBAL LIGATION Bilateral 12/04/2013   Procedure: POST PARTUM TUBAL LIGATION;  Surgeon: Kirkland Hun, MD;  Location: [facility];  Service: Gynecology;  Laterality: Bilateral;  . WISDOM TOOTH EXTRACTION      OB History    Gravida Para Term Preterm AB Living   3 3 3     3    SAB TAB Ectopic Multiple Live Births                   Home Medications    Prior to Admission medications   Not on File    Family History Family History  Problem Relation Age of  Onset  . Hypertension Mother   . COPD Maternal Grandfather   . Diabetes Maternal Grandfather   . Epilepsy Paternal Grandmother   . Breast cancer Maternal Aunt   . Prostate cancer Maternal Uncle     Social History Social History  Substance Use Topics  . Smoking status: Never Smoker  . Smokeless tobacco: Never Used  . Alcohol use Yes     Comment: cocktail every few months     Allergies   Amoxicillin   Review of Systems Review of Systems  10 systems reviewed and all are negative for acute change except as noted in the HPI.   Physical Exam Updated Vital Signs BP 140/79   Pulse 85   Temp 97.7 F (36.5 C) (Oral)   Resp 18   Ht 5\' 6"  (1.676 m)   Wt 206 lb 5 oz (93.6 kg)   LMP 11/27/2015   SpO2 99%   BMI 33.30 kg/m   Physical Exam  Constitutional: She is oriented to person, place, and time. She appears well-developed and well-nourished. No distress.  HENT:  Head: Normocephalic and atraumatic.  Eyes: Conjunctivae and EOM are normal. Pupils are equal, round, and reactive to light.  Neck: Normal range of motion. Neck supple.  Cardiovascular: Normal rate, regular rhythm, normal heart sounds and intact distal pulses.   No murmur  heard. Pulmonary/Chest: Effort normal and breath sounds normal. No respiratory distress. She has no wheezes.  Lungs clear to auscultation  Abdominal: Soft. Bowel sounds are normal. She exhibits no distension. There is tenderness (lower abdomen). There is no rebound and no guarding.  Genitourinary: Vagina normal and uterus normal.  Genitourinary Comments: External exam is normal. Mild discharge, yellow. No CMT  Musculoskeletal: Normal range of motion.  Neurological: She is alert and oriented to person, place, and time.  Skin: Skin is warm and dry.  Psychiatric: She has a normal mood and affect.  Nursing note and vitals reviewed.    ED Treatments / Results  DIAGNOSTIC STUDIES:  Oxygen Saturation is 100% on RA, normal by my interpretation.     COORDINATION OF CARE:  11:57 PM Discussed treatment plan with pt at bedside and pt agreed to plan.  Labs (all labs ordered are listed, but only abnormal results are displayed) Labs Reviewed  WET PREP, GENITAL - Abnormal; Notable for the following:       Result Value   WBC, Wet Prep HPF POC FEW (*)    All other components within normal limits  COMPREHENSIVE METABOLIC PANEL - Abnormal; Notable for the following:    AST 14 (*)    All other components within normal limits  CBC - Abnormal; Notable for the following:    Hemoglobin 11.8 (*)    MCH 25.7 (*)    All other components within normal limits  URINALYSIS, ROUTINE W REFLEX MICROSCOPIC (NOT AT [facility]) - Abnormal; Notable for the following:    APPearance CLOUDY (*)    Ketones, ur 15 (*)    Leukocytes, UA TRACE (*)    All other components within normal limits  URINE MICROSCOPIC-ADD ON - Abnormal; Notable for the following:    Squamous Epithelial / LPF 6-30 (*)    Bacteria, UA FEW (*)    All other components within normal limits  LIPASE, BLOOD  POC URINE PREG, ED  GC/CHLAMYDIA PROBE AMP ([place]) NOT AT [facility]    EKG  EKG Interpretation None       Radiology US Transvaginal Non-ob  Result Date: 12/28/2015 CLINICAL DATA:  Initial evaluation for acute bilateral pelvic pain for 2 weeks. EXAM: TRANSABDOMINAL AND TRANSVAGINAL ULTRASOUND OF PELVIS TECHNIQUE: Both transabdominal and transvaginal ultrasound examinations of the pelvis were performed. Transabdominal technique was performed for global imaging of the pelvis including uterus, ovaries, adnexal regions, and pelvic cul-de-sac. It was necessary to proceed with endovaginal exam following the transabdominal exam to visualize the uterus and ovaries. COMPARISON:  Prior ultrasound from 04/22/2013. FINDINGS: Uterus Measurements: 7.3 x 4.2 x 6.1 cm. No fibroids or other mass visualized. Few scattered nabothian cysts noted. Endometrium Thickness: 11.7 mm.  No focal abnormality  visualized. Right ovary Measurements: 3.8 x 2.7 x 3.4 cm. Normal appearance/no adnexal mass. Left ovary Measurements: 3.8 x 2.0 x 3.2 cm. Normal appearance/no adnexal mass. Other findings No abnormal free fluid. IMPRESSION: Normal pelvic ultrasound.  No acute abnormality identified. Electronically Signed   By: Rise Mu M.D.   On: 12/28/2015 02:08  US Pelvis Complete  Result Date: 12/28/2015 CLINICAL DATA:  Initial evaluation for acute bilateral pelvic pain for 2 weeks. EXAM: TRANSABDOMINAL AND TRANSVAGINAL ULTRASOUND OF PELVIS TECHNIQUE: Both transabdominal and transvaginal ultrasound examinations of the pelvis were performed. Transabdominal technique was performed for global imaging of the pelvis including uterus, ovaries, adnexal regions, and pelvic cul-de-sac. It was necessary to proceed with endovaginal exam following the transabdominal exam to visualize the  uterus and ovaries. COMPARISON:  Prior ultrasound from 04/22/2013. FINDINGS: Uterus Measurements: 7.3 x 4.2 x 6.1 cm. No fibroids or other mass visualized. Few scattered nabothian cysts noted. Endometrium Thickness: 11.7 mm.  No focal abnormality visualized. Right ovary Measurements: 3.8 x 2.7 x 3.4 cm. Normal appearance/no adnexal mass. Left ovary Measurements: 3.8 x 2.0 x 3.2 cm. Normal appearance/no adnexal mass. Other findings No abnormal free fluid. IMPRESSION: Normal pelvic ultrasound.  No acute abnormality identified. Electronically Signed   By: Rise Mu M.D.   On: 12/28/2015 02:08   Procedures Procedures   Medications Ordered in ED Medications - No data to display   Initial Impression / Assessment and Plan / ED Course  I have reviewed the triage vital signs and the nursing notes.  Pertinent labs & imaging results that were available during my care of the patient were reviewed by me and considered in my medical decision making (see chart for details).  Clinical Course  Comment By Time  Pelvic exam was  benign. [place] ordered - as there is persistent lower quadrant pain and tenderness, w/o peritoneal signs. Pt has normal labs. Pain also off and on for long duration - thus we dont think CT is mandated. If [place] is neg, we will d/c. Derwood Kaplan, MD 07/24 0200      Final Clinical Impressions(s) / ED Diagnoses   Final diagnoses:  Pelvic pain in female    New Prescriptions New Prescriptions   No medications on file    I personally performed the services described in this documentation, which was scribed in my presence. The recorded information has been reviewed and is accurate.  Pt comes in with cc of lower quadrant abd pain. Pain is subacute. Pain is intermittent, but getting more frequent.  VSS and WNL, no fevers, no uti. Exam is non surgical. Low suspicion for an acute pathology like torsion, appendicitis, cholecystitis  Will get basic labs. Possible US imaging. PID is in the ddx, pelvic exam will be done.   Derwood Kaplan, MD 12/28/15 [phone]

## 2015-12-27 NOTE — ED Triage Notes (Signed)
Patient reports intermittent low abdominal pain onset 2 weeks ago , denies nausea or vomitting , no fever or diarrhea , mild vaginal discharge .

## 2015-12-28 ENCOUNTER — Emergency Department (HOSPITAL_COMMUNITY): Payer: Managed Care, Other (non HMO)

## 2015-12-28 LAB — WET PREP, GENITAL
Clue Cells Wet Prep HPF POC: NONE SEEN
Sperm: NONE SEEN
Trich, Wet Prep: NONE SEEN
YEAST WET PREP: NONE SEEN

## 2015-12-28 LAB — GC/CHLAMYDIA PROBE AMP (~~LOC~~) NOT AT ARMC
Chlamydia: NEGATIVE
NEISSERIA GONORRHEA: NEGATIVE

## 2015-12-28 MED ORDER — NAPROXEN 500 MG PO TABS: 500.0000 mg | ORAL_TABLET | Freq: Two times a day (BID) | ORAL | 0 refills | Status: DC

## 2015-12-28 NOTE — Discharge Instructions (Signed)
We saw you in the ER for the pelvic pain. All the results in the ER are normal, labs and imaging. We are not sure what is causing your symptoms. The workup in the ER is not complete, and is limited to screening for life threatening and emergent conditions only, so please see a primary care doctor for further evaluation.

## 2015-12-29 ENCOUNTER — Encounter (HOSPITAL_COMMUNITY): Payer: Self-pay | Admitting: Emergency Medicine

## 2015-12-29 ENCOUNTER — Ambulatory Visit (HOSPITAL_COMMUNITY)
Admission: EM | Admit: 2015-12-29 | Discharge: 2015-12-29 | Disposition: A | Discharge status: Home / Self Care | Payer: Managed Care, Other (non HMO)

## 2015-12-29 DIAGNOSIS — R252 Cramp and spasm: Secondary | ICD-10-CM

## 2015-12-29 NOTE — ED Triage Notes (Signed)
The patient presented to the [facility] with a complaint of right lower leg cramping and pain that started at 6 am today.

## 2016-02-01 ENCOUNTER — Other Ambulatory Visit (INDEPENDENT_AMBULATORY_CARE_PROVIDER_SITE_OTHER): Payer: Managed Care, Other (non HMO)

## 2016-02-01 ENCOUNTER — Encounter: Payer: Self-pay | Admitting: Nurse Practitioner

## 2016-02-01 ENCOUNTER — Ambulatory Visit (INDEPENDENT_AMBULATORY_CARE_PROVIDER_SITE_OTHER): Payer: Managed Care, Other (non HMO) | Admitting: Nurse Practitioner

## 2016-02-01 VITALS — BP 130/70 | HR 80 | Temp 98.1°F | Resp 20 | Wt 205.0 lb

## 2016-02-01 DIAGNOSIS — Z0001 Encounter for general adult medical examination with abnormal findings: Secondary | ICD-10-CM

## 2016-02-01 DIAGNOSIS — B373 Candidiasis of vulva and vagina: Secondary | ICD-10-CM

## 2016-02-01 DIAGNOSIS — N920 Excessive and frequent menstruation with regular cycle: Secondary | ICD-10-CM

## 2016-02-01 DIAGNOSIS — L209 Atopic dermatitis, unspecified: Secondary | ICD-10-CM

## 2016-02-01 DIAGNOSIS — D649 Anemia, unspecified: Secondary | ICD-10-CM | POA: Diagnosis not present

## 2016-02-01 DIAGNOSIS — J302 Other seasonal allergic rhinitis: Secondary | ICD-10-CM | POA: Insufficient documentation

## 2016-02-01 DIAGNOSIS — B3731 Acute candidiasis of vulva and vagina: Secondary | ICD-10-CM

## 2016-02-01 LAB — TSH: TSH: 0.92 u[IU]/mL (ref 0.35–4.50)

## 2016-02-01 LAB — HEMOGLOBIN A1C: HEMOGLOBIN A1C: 6 % (ref 4.6–6.5)

## 2016-02-01 MED ORDER — CLOTRIMAZOLE 2 % VA CREA: 1.0000 | TOPICAL_CREAM | Freq: Every day | VAGINAL | 0 refills | Status: DC

## 2016-02-01 MED ORDER — FLUCONAZOLE 150 MG PO TABS: 150.0000 mg | ORAL_TABLET | Freq: Every day | ORAL | 0 refills | Status: DC

## 2016-02-01 NOTE — Progress Notes (Signed)
Pre visit review using our clinic review tool, if applicable. No additional management support is needed unless otherwise documented below in the visit note. 

## 2016-02-01 NOTE — Patient Instructions (Signed)
Intertrigo Intertrigo is a skin condition that occurs in between folds of skin in places on the body that rub together a lot and do not get much ventilation. It is caused by heat, moisture, friction, sweat retention, and lack of air circulation, which produces red, irritated patches and, sometimes, scaling or drainage. People who have diabetes, who are obese, or who have treatment with antibiotics are at increased risk for intertrigo. The most common sites for intertrigo to occur include:  The groin.  The breasts.  The armpits.  Folds of abdominal skin.  Webbed spaces between the fingers or toes. Intertrigo may be aggravated by:  Sweat.  Feces.  Yeast or bacteria that are present near skin folds.  Urine.  Vaginal discharge. HOME CARE INSTRUCTIONS  The following steps can be taken to reduce friction and keep the affected area cool and dry:  Expose skin folds to the air.  Keep deep skin folds separated with cotton or linen cloth. Avoid tight fitting clothing that could cause chafing.  Wear open-toed shoes or sandals to help reduce moisture between the toes.  Apply absorbent powders to affected areas as directed by your caregiver.  Apply over-the-counter barrier pastes, such as zinc oxide, as directed by your caregiver.  If you develop a fungal infection in the affected area, your caregiver may have you use antifungal creams. SEEK MEDICAL CARE IF:   The rash is not improving after 1 week of treatment.  The rash is getting worse (more red, more swollen, more painful, or spreading).  You have a fever or chills. MAKE SURE YOU:   Understand these instructions.  Will watch your condition.  Will get help right away if you are not doing well or get worse.   This information is not intended to replace advice given to you by your health care provider. Make sure you discuss any questions you have with your health care provider.   Document Released: 05/23/2005 Document  Revised: 08/15/2011 Document Reviewed: 11/24/2014 Elsevier Interactive Patient Education 2016 [employer]  

## 2016-02-01 NOTE — Assessment & Plan Note (Signed)
Last CBC 12/2015: 11/38.3 stable. Use MVI OTC 1tab daily and iron rich foods. 

## 2016-02-01 NOTE — Progress Notes (Signed)
Subjective:    Patient ID: Casey Hamilton, female    DOB: [birth year], 30 y.o.   MRN: [id number] Patient presents today for complete physical exam and concern for groin rash.  Rash  This is a recurrent problem. The current episode started more than 1 month ago. The problem has been waxing and waning since onset. The affected locations include the groin. The rash is characterized by scaling and itchiness. She was exposed to nothing. Pertinent negatives include no anorexia, congestion, cough, diarrhea, fatigue, fever, joint pain, rhinorrhea, shortness of breath or vomiting. (No vaginal discharge, no dysuria) Treatments tried: diflucan x1tab over 6months ago. The treatment provided mild relief. Her past medical history is significant for allergies. There is no history of asthma or eczema.   Immunizations: up to date with Tdap 2015 while pregnant, wants flu vaccine today Diet: none Weight: no concern Wt Readings from Last 3 Encounters:  02/01/16 205 lb (93 kg)  12/29/15 206 lb (93.4 kg)  12/27/15 206 lb 5 oz (93.6 kg)   Exercise: none Home Safety: feels safe at home Depression/Suicide/ Anxiety: none Pap Smear (every 40yrs for >21-29 without HPV, every 71yrs for >30-34yrs with HPV): up to date, last done 08/2015, normal per patient Vision: up to date, use of corrective lens Dental: up to date Sexual History (birth control, marital status, STD):  Works for [employer] ([employer]), married with 3 children (all girls), feels safe at home, s/p tubal ligation.  Medications and allergies reviewed with patient and updated if appropriate.  Patient Active Problem List   Diagnosis Date Noted  . Atopic dermatitis 02/01/2016  . Other seasonal allergic rhinitis 02/01/2016  . Menorrhagia with regular cycle 02/01/2016  . S/P tubal ligation 12/05/2013  . Anemia 12/05/2013  . Rectal bleeding - consult to GI; neg eval 12/03/2013    Current Outpatient Prescriptions on File Prior to Visit   Medication Sig Dispense Refill  . naproxen (NAPROSYN) 500 MG tablet Take 1 tablet (500 mg total) by mouth 2 (two) times daily. 30 tablet 0   No current facility-administered medications on file prior to visit.     Past Medical History:  Diagnosis Date  . Allergy   . Anemia   . Contact dermatitis   . Medical history non-contributory     Past Surgical History:  Procedure Laterality Date  . FOOT SURGERY Right   . TUBAL LIGATION Bilateral 12/04/2013   Procedure: POST PARTUM TUBAL LIGATION;  Surgeon: Kirkland Hun, MD;  Location: [facility];  Service: Gynecology;  Laterality: Bilateral;  . WISDOM TOOTH EXTRACTION      Social History   Social History  . Marital status: Married    Spouse name: N/A  . Number of children: 2  . Years of education: N/A   Social History Main Topics  . Smoking status: Never Smoker  . Smokeless tobacco: Never Used  . Alcohol use Yes     Comment: cocktail every few months  . Drug use: No  . Sexual activity: Yes    Birth control/ protection: Surgical     Comment: s/p tubal ligation   Other Topics Concern  . None   Social History Narrative  . None    Family History  Problem Relation Age of Onset  . Hypertension Mother   . COPD Maternal Grandfather   . Diabetes Maternal Grandfather   . Epilepsy Paternal Grandmother   . Breast cancer Maternal Aunt   . Prostate cancer Maternal Uncle     Review  of Systems  Constitutional: Negative for fatigue and fever.  HENT: Negative for congestion, postnasal drip, rhinorrhea and sinus pressure.   Eyes: Negative for visual disturbance.  Respiratory: Negative for cough and shortness of breath.   Cardiovascular: Negative for chest pain, palpitations and leg swelling.  Gastrointestinal: Negative for abdominal pain, anorexia, constipation, diarrhea, nausea and vomiting.  Endocrine: Negative for polydipsia, polyphagia and polyuria.  Genitourinary: Negative for dysuria, frequency, urgency, vaginal discharge and  vaginal pain.  Musculoskeletal: Negative for arthralgias, joint pain and myalgias.  Skin: Positive for rash.  Neurological: Negative for headaches.  Hematological: Negative for adenopathy.  Psychiatric/Behavioral: Negative for sleep disturbance and suicidal ideas. The patient is not nervous/anxious.        Objective:   Vitals:   02/01/16 0907  BP: 130/70  Pulse: 80  Resp: 20  Temp: 98.1 F (36.7 C)    Body mass index is 33.09 kg/m.  <ECGINTERP>  Physical Examination:  Physical Exam  Constitutional: She is oriented to person, place, and time. She appears well-developed. No distress.  HENT:  Nose: Nose normal.  Mouth/Throat: Oropharynx is clear and moist. No oropharyngeal exudate.  Eyes: Conjunctivae and EOM are normal. Pupils are equal, round, and reactive to light.  Neck: No thyromegaly present.  Cardiovascular: Normal rate, regular rhythm and normal heart sounds.   Pulmonary/Chest: Effort normal and breath sounds normal.  Abdominal: Soft. Bowel sounds are normal. She exhibits no distension. There is no tenderness.  Genitourinary: Vagina normal. Pelvic exam was performed with patient supine. There is no rash or tenderness on the right labia. There is no rash or tenderness on the left labia. No vaginal discharge found.  Genitourinary Comments: marcerated skin in groin folds, no drainage, no induration, no erythema.  Musculoskeletal: Normal range of motion. She exhibits no edema.  Lymphadenopathy:    She has no cervical adenopathy.  Neurological: She is alert and oriented to person, place, and time. Coordination normal.  Skin: Skin is warm and dry. Rash noted.  Psychiatric: She has a normal mood and affect. Her behavior is normal.  Vitals reviewed.   ASSESSMENT and PLAN:  Rozanna Boerlisia was seen today for rash and establish care.  Diagnoses and all orders for this visit:  Encounter for preventative adult health care exam with abnormal findings  Menorrhagia with regular  cycle -     TSH; Future -     Hemoglobin A1c; Future  Anemia, unspecified anemia type  Vaginal candidiasis -     clotrimazole (GYNE-LOTRIMIN 3) 2 % vaginal cream; Place 1 Applicatorful vaginally at bedtime. -     fluconazole (DIFLUCAN) 150 MG tablet; Take 1 tablet (150 mg total) by mouth daily. Take second tab 3days apart from first tab  Atopic dermatitis -     TSH; Future -     Hemoglobin A1c; Future  Other seasonal allergic rhinitis  Other orders -     Flu Vaccine QUAD 36+ mos IM

## 2016-02-01 NOTE — Assessment & Plan Note (Signed)
Last CBC 12/2015: 11/38.3 stable. Use MVI OTC 1tab daily and iron rich foods.

## 2016-02-01 NOTE — Assessment & Plan Note (Addendum)
use of OTC antihistamine prn

## 2016-02-01 NOTE — Assessment & Plan Note (Signed)
Stable Continue use of moisturizing cream

## 2016-03-22 ENCOUNTER — Emergency Department (HOSPITAL_COMMUNITY): Payer: Managed Care, Other (non HMO)

## 2016-03-22 ENCOUNTER — Encounter (HOSPITAL_COMMUNITY): Payer: Self-pay

## 2016-03-22 ENCOUNTER — Emergency Department (HOSPITAL_COMMUNITY)
Admission: EM | Admit: 2016-03-22 | Discharge: 2016-03-22 | Disposition: A | Discharge status: Home / Self Care | Payer: Managed Care, Other (non HMO) | Attending: Emergency Medicine | Admitting: Emergency Medicine

## 2016-03-22 DIAGNOSIS — R0789 Other chest pain: Secondary | ICD-10-CM | POA: Insufficient documentation

## 2016-03-22 DIAGNOSIS — R52 Pain, unspecified: Secondary | ICD-10-CM

## 2016-03-22 DIAGNOSIS — R1013 Epigastric pain: Secondary | ICD-10-CM | POA: Insufficient documentation

## 2016-03-22 LAB — I-STAT BETA HCG BLOOD, ED (MC, WL, AP ONLY)

## 2016-03-22 LAB — BASIC METABOLIC PANEL
Anion gap: 6 (ref 5–15)
BUN: 10 mg/dL (ref 6–20)
CHLORIDE: 108 mmol/L (ref 101–111)
CO2: 26 mmol/L (ref 22–32)
CREATININE: 0.78 mg/dL (ref 0.44–1.00)
Calcium: 9.2 mg/dL (ref 8.9–10.3)
GFR calc Af Amer: >60 mL/min (ref >60)
Glucose, Bld: 100 mg/dL — ABNORMAL HIGH (ref 65–99)
Potassium: 4 mmol/L (ref 3.5–5.1)
SODIUM: 140 mmol/L (ref 135–145)

## 2016-03-22 LAB — LIPASE, BLOOD: Lipase: 30 U/L (ref 11–51)

## 2016-03-22 LAB — I-STAT TROPONIN, ED: Troponin i, poc: 0 ng/mL (ref 0.00–0.08)

## 2016-03-22 LAB — CBC
HCT: 38.7 % (ref 36.0–46.0)
Hemoglobin: 12.5 g/dL (ref 12.0–15.0)
MCH: 26.4 pg (ref 26.0–34.0)
MCHC: 32.3 g/dL (ref 30.0–36.0)
MCV: 81.6 fL (ref 78.0–100.0)
PLATELETS: 257 10*3/uL (ref 150–400)
RBC: 4.74 MIL/uL (ref 3.87–5.11)
RDW: 14.5 % (ref 11.5–15.5)
WBC: 7.8 10*3/uL (ref 4.0–10.5)

## 2016-03-22 MED ORDER — ACETAMINOPHEN 325 MG PO TABS
650.0000 mg | ORAL_TABLET | Freq: Once | ORAL | Status: AC
  Administered 2016-03-22: 650 mg via ORAL
  Filled 2016-03-22: qty 2

## 2016-03-22 MED ORDER — GI COCKTAIL ~~LOC~~
30.0000 mL | Freq: Once | ORAL | Status: AC
  Administered 2016-03-22: 30 mL via ORAL
  Filled 2016-03-22: qty 30

## 2016-03-22 MED ORDER — FAMOTIDINE 20 MG PO TABS
20.0000 mg | ORAL_TABLET | Freq: Once | ORAL | Status: AC
  Administered 2016-03-22: 20 mg via ORAL
  Filled 2016-03-22: qty 1

## 2016-03-22 NOTE — ED Triage Notes (Signed)
Patient complains of chest pressure and epigastric pain x 1 day with radiation to thoracic back. Nausea with same. NAD. No cold or cough symptoms

## 2016-03-22 NOTE — ED Provider Notes (Signed)
[facility]Y DEPT Provider Note   CSN: [id number] Arrival date & time: 03/22/16  1026     History   Chief Complaint Chief Complaint  Patient presents with  . Chest Pain    HPI Casey Hamilton is a 30 y.o. female.  Patient c/o pain epigastric area, radiates around to back/midscapula area for the past day. Occurs at rest. Pain constant. No associated sob, nv or diaphoresis. Pain is not pleuritic. No other recent exertional cp or discomfort. No unusual doe or fatigue. No cough or uri c/o. No fever or chills. No heartburn. No leg pain or swelling. No recent surgery, immobility or trauma. No hx dvt or pe. No hx gallstones   The history is provided by the patient.  Chest Pain   Associated symptoms include abdominal pain. Pertinent negatives include no back pain, no cough, no fever, no headaches, no shortness of breath and no vomiting.    Past Medical History:  Diagnosis Date  . Allergy   . Anemia   . Contact dermatitis   . Medical history non-contributory     Patient Active Problem List   Diagnosis Date Noted  . Atopic dermatitis 02/01/2016  . Other seasonal allergic rhinitis 02/01/2016  . Menorrhagia with regular cycle 02/01/2016  . S/P tubal ligation 12/05/2013  . Anemia 12/05/2013  . Rectal bleeding - consult to GI; neg eval 12/03/2013    Past Surgical History:  Procedure Laterality Date  . FOOT SURGERY Right   . TUBAL LIGATION Bilateral 12/04/2013   Procedure: POST PARTUM TUBAL LIGATION;  Surgeon: Kirkland Hun, MD;  Location: [facility];  Service: Gynecology;  Laterality: Bilateral;  . WISDOM TOOTH EXTRACTION      OB History    Gravida Para Term Preterm AB Living   3 3 3     3    SAB TAB Ectopic Multiple Live Births           1       Home Medications    Prior to Admission medications   Medication Sig Start Date End Date Taking? Authorizing Provider  ibuprofen (ADVIL,MOTRIN) 200 MG tablet Take 200 mg by mouth every 6 (six) hours as needed for moderate  pain.   Yes Historical Provider, MD  clotrimazole (GYNE-LOTRIMIN 3) 2 % vaginal cream Place 1 Applicatorful vaginally at bedtime. Patient not taking: Reported on 03/22/2016 02/01/16   Anne Ng, NP  fluconazole (DIFLUCAN) 150 MG tablet Take 1 tablet (150 mg total) by mouth daily. Take second tab 3days apart from first tab Patient not taking: Reported on 03/22/2016 02/01/16   Anne Ng, NP  naproxen (NAPROSYN) 500 MG tablet Take 1 tablet (500 mg total) by mouth 2 (two) times daily. Patient not taking: Reported on 03/22/2016 12/28/15   Derwood Kaplan, MD    Family History Family History  Problem Relation Age of Onset  . Hypertension Mother   . COPD Maternal Grandfather   . Diabetes Maternal Grandfather   . Epilepsy Paternal Grandmother   . Breast cancer Maternal Aunt   . Prostate cancer Maternal Uncle     Social History Social History  Substance Use Topics  . Smoking status: Never Smoker  . Smokeless tobacco: Never Used  . Alcohol use Yes     Comment: cocktail every few months     Allergies   Amoxicillin   Review of Systems Review of Systems  Constitutional: Negative for fever.  HENT: Negative for sore throat.   Eyes: Negative for redness.  Respiratory: Negative for  cough and shortness of breath.   Cardiovascular: Positive for chest pain.  Gastrointestinal: Positive for abdominal pain. Negative for vomiting.  Genitourinary: Negative for flank pain.  Musculoskeletal: Negative for back pain and neck pain.  Skin: Negative for rash.  Neurological: Negative for headaches.  Hematological: Does not bruise/bleed easily.  Psychiatric/Behavioral: Negative for confusion.     Physical Exam Updated Vital Signs BP 128/93 (BP Location: Left Arm)   Pulse 65   Temp 97.7 F (36.5 C) (Oral)   Resp 14   LMP 03/11/2016   SpO2 100%   Physical Exam  Constitutional: She appears well-developed and well-nourished. No distress.  HENT:  Mouth/Throat: Oropharynx is  clear and moist.  Eyes: Conjunctivae are normal. No scleral icterus.  Neck: Neck supple. No tracheal deviation present.  Cardiovascular: Normal rate, regular rhythm, normal heart sounds and intact distal pulses.   No murmur heard. Pulmonary/Chest: Effort normal and breath sounds normal. No respiratory distress.  Abdominal: Soft. Normal appearance and bowel sounds are normal. She exhibits no distension. There is no tenderness.  Musculoskeletal: She exhibits no edema or tenderness.  Neurological: She is alert.  Skin: Skin is warm and dry. No rash noted.  Psychiatric: She has a normal mood and affect.  Nursing note and vitals reviewed.    ED Treatments / Results  Labs (all labs ordered are listed, but only abnormal results are displayed) Labs Reviewed  BASIC METABOLIC PANEL - Abnormal; Notable for the following:       Result Value   Glucose, Bld 100 (*)    All other components within normal limits  CBC  LIPASE, BLOOD  I-STAT TROPOININ, ED  I-STAT BETA HCG BLOOD, ED ([facility], [facility], [facility] ONLY)    EKG  EKG Interpretation  Date/Time:  Tuesday March 22 2016 10:46:50 EDT Ventricular Rate:  69 PR Interval:  152 QRS Duration: 76 QT Interval:  382 QTC Calculation: 409 R Axis:   63 Text Interpretation:  Normal sinus rhythm with sinus arrhythmia Normal ECG Confirmed by Denton LankSTEINL  MD, Caryn BeeKEVIN ([id number]) on 03/22/2016 1:15:12 PM       Radiology Dg Chest 2 View  Result Date: 03/22/2016 CLINICAL DATA:  Chest pain EXAM: CHEST  2 VIEW COMPARISON:  November 19, 2014 FINDINGS: Lungs are clear. Heart size and pulmonary vascularity are normal. No adenopathy. No bone lesions. No pneumothorax. IMPRESSION: No edema or consolidation. Electronically Signed   By: Bretta BangWilliam  Woodruff III M.D.   On: 03/22/2016 11:34    Procedures Procedures (including critical care time)  Medications Ordered in ED Medications - No data to display   Initial Impression / Assessment and Plan / ED Course  I have reviewed the  triage vital signs and the nursing notes.  Pertinent labs & imaging results that were available during my care of the patient were reviewed by me and considered in my medical decision making (see chart for details).  Clinical Course    Labs. Jay SchlichterXry.  Reviewed nursing notes and prior charts for additional history.   Will get u/s.     Final Clinical Impressions(s) / ED Diagnoses   Final diagnoses:  None    New Prescriptions New Prescriptions   No medications on file     Cathren LaineKevin Arsh Feutz, MD 03/30/16 1006

## 2016-03-22 NOTE — Discharge Instructions (Signed)
It was our pleasure to provide your ER care today - we hope that you feel better.  Rest. Drink plenty of fluids.   Take pepcid (acid blocker medication), and maalox as need for symptom relief.    You may also try tylenol/advil as need.  Follow up with primary care doctor in the next couple days for recheck.  Return to ER if worse, new symptoms, recurrent or persistent chest pain, trouble breathing, fevers, other concern.

## 2016-03-23 ENCOUNTER — Encounter (HOSPITAL_COMMUNITY): Payer: Self-pay

## 2016-03-23 DIAGNOSIS — R1013 Epigastric pain: Secondary | ICD-10-CM | POA: Insufficient documentation

## 2016-03-23 DIAGNOSIS — R112 Nausea with vomiting, unspecified: Secondary | ICD-10-CM | POA: Insufficient documentation

## 2016-03-23 NOTE — ED Triage Notes (Signed)
Pt states she was seen on yesterday for same symptoms; pt states symptoms got worse; pt states chest pain at 10/10 on arrival. Pt a&ox 4 on arrival. Pt c/o of GERD with not relief from meds given on yesterday; pt states she had one episode of n/v;

## 2016-03-24 ENCOUNTER — Emergency Department (HOSPITAL_COMMUNITY)
Admission: EM | Admit: 2016-03-24 | Discharge: 2016-03-24 | Disposition: A | Discharge status: Home / Self Care | Payer: Self-pay | Attending: Emergency Medicine | Admitting: Emergency Medicine

## 2016-03-24 ENCOUNTER — Emergency Department (HOSPITAL_COMMUNITY): Payer: Self-pay

## 2016-03-24 DIAGNOSIS — R1013 Epigastric pain: Secondary | ICD-10-CM

## 2016-03-24 LAB — CBC WITH DIFFERENTIAL/PLATELET
BASOS ABS: 0 10*3/uL (ref 0.0–0.1)
BASOS PCT: 0 %
EOS ABS: 0.3 10*3/uL (ref 0.0–0.7)
Eosinophils Relative: 2 %
HEMATOCRIT: 38.2 % (ref 36.0–46.0)
HEMOGLOBIN: 12.2 g/dL (ref 12.0–15.0)
Lymphocytes Relative: 25 %
Lymphs Abs: 3.1 10*3/uL (ref 0.7–4.0)
MCH: 26.1 pg (ref 26.0–34.0)
MCHC: 31.9 g/dL (ref 30.0–36.0)
MCV: 81.8 fL (ref 78.0–100.0)
MONO ABS: 0.9 10*3/uL (ref 0.1–1.0)
MONOS PCT: 7 %
NEUTROS ABS: 8.1 10*3/uL — AB (ref 1.7–7.7)
NEUTROS PCT: 66 %
Platelets: 262 10*3/uL (ref 150–400)
RBC: 4.67 MIL/uL (ref 3.87–5.11)
RDW: 14.7 % (ref 11.5–15.5)
WBC: 12.4 10*3/uL — ABNORMAL HIGH (ref 4.0–10.5)

## 2016-03-24 LAB — COMPREHENSIVE METABOLIC PANEL
ALK PHOS: 66 U/L (ref 38–126)
ALT: 28 U/L (ref 14–54)
ANION GAP: 8 (ref 5–15)
AST: 21 U/L (ref 15–41)
Albumin: 4.1 g/dL (ref 3.5–5.0)
BILIRUBIN TOTAL: 0.6 mg/dL (ref 0.3–1.2)
BUN: 12 mg/dL (ref 6–20)
CALCIUM: 9.7 mg/dL (ref 8.9–10.3)
CO2: 27 mmol/L (ref 22–32)
CREATININE: 0.8 mg/dL (ref 0.44–1.00)
Chloride: 103 mmol/L (ref 101–111)
GFR calc non Af Amer: >60 mL/min (ref >60)
Glucose, Bld: 101 mg/dL — ABNORMAL HIGH (ref 65–99)
Potassium: 4 mmol/L (ref 3.5–5.1)
Sodium: 138 mmol/L (ref 135–145)
TOTAL PROTEIN: 7.6 g/dL (ref 6.5–8.1)

## 2016-03-24 LAB — LIPASE, BLOOD: LIPASE: 27 U/L (ref 11–51)

## 2016-03-24 MED ORDER — IOPAMIDOL (ISOVUE-300) INJECTION 61%
100.0000 mL | Freq: Once | INTRAVENOUS | Status: AC | PRN
  Administered 2016-03-24: 100 mL via INTRAVENOUS

## 2016-03-24 MED ORDER — FENTANYL CITRATE (PF) 100 MCG/2ML IJ SOLN
100.0000 ug | Freq: Once | INTRAMUSCULAR | Status: AC
  Administered 2016-03-24: 100 ug via INTRAVENOUS
  Filled 2016-03-24: qty 2

## 2016-03-24 MED ORDER — IOPAMIDOL (ISOVUE-300) INJECTION 61%
INTRAVENOUS | Status: AC
  Filled 2016-03-24: qty 100

## 2016-03-24 MED ORDER — ONDANSETRON HCL 4 MG/2ML IJ SOLN
4.0000 mg | Freq: Once | INTRAMUSCULAR | Status: AC
  Administered 2016-03-24: 4 mg via INTRAVENOUS
  Filled 2016-03-24: qty 2

## 2016-03-24 NOTE — Discharge Instructions (Signed)
PLEASE SEE YOUR PRIMARY DOCTOR IN 2 WEEKS.  YOU MAY NEED ANOTHER CAT SCAN OF YOUR ABDOMEN TO MAKE THE LYMPH NODE IS GETTING SMALLER  Abdominal (belly) pain can be caused by many things. any cases can be observed and treated at home after initial evaluation in the emergency department. Even though you are being discharged home, abdominal pain can be unpredictable. Therefore, you need a repeated exam if your pain does not resolve, returns, or worsens. Most patients with abdominal pain don't have to be admitted to the hospital or have surgery, but serious problems like appendicitis and gallbladder attacks can start out as nonspecific pain. Many abdominal conditions cannot be diagnosed in one visit, so follow-up evaluations are very important. SEEK IMMEDIATE MEDICAL ATTENTION IF: The pain does not go away or becomes severe, particularly over the next 8-12 hours.  A temperature above 100.36F develops.  Repeated vomiting occurs (multiple episodes).  The pain becomes localized to portions of the abdomen. The right side could possibly be appendicitis. In an adult, the left lower portion of the abdomen could be colitis or diverticulitis.  Blood is being passed in stools or vomit (bright red or black tarry stools).  Return also if you develop chest pain, difficulty breathing, dizziness or fainting, or become confused, poorly responsive, or inconsolable.

## 2016-03-24 NOTE — ED Provider Notes (Signed)
[facility] Provider Note   CSN: [id number] Arrival date & time: 03/23/16  2212  By signing my name below, I, Rosario Adie, attest that this documentation has been prepared under the direction and in the presence of Zadie Rhine, MD. Electronically Signed: Rosario Adie, ED Scribe. 03/24/16. 3:14 AM.  History   Chief Complaint Chief Complaint  Patient presents with  . Emesis  . Gastroesophageal Reflux   The history is provided by the patient. No language interpreter was used.  Abdominal Pain   This is a new problem. The current episode started more than 2 days ago. The problem occurs constantly. The problem has been gradually worsening. The pain is associated with an unknown factor. The pain is located in the epigastric region. Associated symptoms include nausea and vomiting. Pertinent negatives include fever. The symptoms are aggravated by certain positions. Nothing relieves the symptoms. Past workup includes ultrasound. Past workup does not include CT scan or surgery. Her past medical history is significant for GERD.   HPI Comments: Casey Hamilton is a 30 y.o. female with a h/o GERD, who presents to the Emergency Department complaining of constant, gradually worsening epigastric abdominal pain w/ radiation into the upper back onset ~3 days ago. She describes her pain as sharp and sometimes stabbing. Pt reports associated nausea and one episodes of NBNB emesis yesterday, but none since. Her pain is exacerbated with certain positions and deep inspirations. She denies her pain being associated with eating. Pt was seen in the ED for same ~1 day ago for same and at that time she had an unremarkable workup, including [place] study which was negative. Pt was rx'd Tylenol, Pepcid, and Bismuth at that time which she has been taking with minimal relief of her pain. She has additionally been taking Pepto Bismol also with minimal relief. No daily NSAID usage and pt does not take  daily medications for any reasons. No h/o prior MI or cardiac issues. No h/o similar symptoms. No hx of prior PE/DVT, recent long travel, surgery, prolonged immobilization, or hormone use.  Pt only occasionally drinks alcohol. Denies fever, bloody stools, melena, leg swelling, or any other associated symptoms.   Past Medical History:  Diagnosis Date  . Allergy   . Anemia   . Contact dermatitis   . Medical history non-contributory    Patient Active Problem List   Diagnosis Date Noted  . Atopic dermatitis 02/01/2016  . Other seasonal allergic rhinitis 02/01/2016  . Menorrhagia with regular cycle 02/01/2016  . S/P tubal ligation 12/05/2013  . Anemia 12/05/2013  . Rectal bleeding - consult to GI; neg eval 12/03/2013   Past Surgical History:  Procedure Laterality Date  . FOOT SURGERY Right   . TUBAL LIGATION Bilateral 12/04/2013   Procedure: POST PARTUM TUBAL LIGATION;  Surgeon: Kirkland Hun, MD;  Location: [facility];  Service: Gynecology;  Laterality: Bilateral;  . WISDOM TOOTH EXTRACTION     OB History    Gravida Para Term Preterm AB Living   3 3 3     3    SAB TAB Ectopic Multiple Live Births           1     Home Medications    Prior to Admission medications   Medication Sig Start Date End Date Taking? Authorizing Provider  ibuprofen (ADVIL,MOTRIN) 200 MG tablet Take 200 mg by mouth every 6 (six) hours as needed for moderate pain.   Yes Historical Provider, MD  clotrimazole (GYNE-LOTRIMIN 3) 2 % vaginal cream  Place 1 Applicatorful vaginally at bedtime. Patient not taking: Reported on 03/23/2016 02/01/16   Anne Ng, NP  fluconazole (DIFLUCAN) 150 MG tablet Take 1 tablet (150 mg total) by mouth daily. Take second tab 3days apart from first tab Patient not taking: Reported on 03/23/2016 02/01/16   Anne Ng, NP  naproxen (NAPROSYN) 500 MG tablet Take 1 tablet (500 mg total) by mouth 2 (two) times daily. Patient not taking: Reported on 03/23/2016 12/28/15   Derwood Kaplan, MD   Family History Family History  Problem Relation Age of Onset  . Hypertension Mother   . COPD Maternal Grandfather   . Diabetes Maternal Grandfather   . Epilepsy Paternal Grandmother   . Breast cancer Maternal Aunt   . Prostate cancer Maternal Uncle    Social History Social History  Substance Use Topics  . Smoking status: Never Smoker  . Smokeless tobacco: Never Used  . Alcohol use Yes     Comment: cocktail every few months   Allergies   Amoxicillin  Review of Systems Review of Systems  Constitutional: Negative for fever.  Cardiovascular: Negative for leg swelling.  Gastrointestinal: Positive for abdominal pain (epigastric), nausea and vomiting. Negative for blood in stool.  Musculoskeletal: Positive for back pain (radiation of abd pain).  All other systems reviewed and are negative.  Physical Exam Updated Vital Signs BP 122/89 (BP Location: Left Arm)   Pulse 82   Temp 97.8 F (36.6 C) (Oral)   Resp 20   Ht 5\' 6"  (1.676 m)   Wt 205 lb (93 kg)   LMP 03/11/2016   SpO2 100%   BMI 33.09 kg/m   Physical Exam  CONSTITUTIONAL: Well developed/well nourished HEAD: Normocephalic/atraumatic EYES: EOMI/PERRL ENMT: Mucous membranes moist NECK: supple no meningeal signs SPINE/BACK:entire spine nontender CV: S1/S2 noted, no murmurs/rubs/gallops noted LUNGS: Lungs are clear to auscultation bilaterally, no apparent distress ABDOMEN: soft, moderate RUQ and epigastric tenderness, no rebound or guarding, bowel sounds noted throughout abdomen GU:no cva tenderness NEURO: Pt is awake/alert/appropriate, moves all extremitiesx4.  No facial droop.   EXTREMITIES: pulses normal/equal, full ROM SKIN: warm, color normal PSYCH: no abnormalities of mood noted, alert and oriented to situation  ED Treatments / Results  DIAGNOSTIC STUDIES: Oxygen Saturation is 100% on RA, normal by my interpretation.   COORDINATION OF CARE: 3:14 AM-Discussed next steps with pt. Pt  verbalized understanding and is agreeable with the plan.   Labs (all labs ordered are listed, but only abnormal results are displayed) Labs Reviewed  COMPREHENSIVE METABOLIC PANEL - Abnormal; Notable for the following:       Result Value   Glucose, Bld 101 (*)    All other components within normal limits  CBC WITH DIFFERENTIAL/PLATELET - Abnormal; Notable for the following:    WBC 12.4 (*)    Neutro Abs 8.1 (*)    All other components within normal limits  LIPASE, BLOOD    EKG  EKG Interpretation  Date/Time:  Thursday March 24 2016 02:28:37 EDT Ventricular Rate:  86 PR Interval:    QRS Duration: 77 QT Interval:  350 QTC Calculation: 419 R Axis:   8 Text Interpretation:  Sinus rhythm Normal ECG No significant change since last tracing Confirmed by Bebe Shaggy  MD, Burnham Trost ([id number]) on 03/24/2016 3:02:51 AM      Radiology Dg Chest 2 View  Result Date: 03/22/2016 CLINICAL DATA:  Chest pain EXAM: CHEST  2 VIEW COMPARISON:  November 19, 2014 FINDINGS: Lungs are clear. Heart size and pulmonary  vascularity are normal. No adenopathy. No bone lesions. No pneumothorax. IMPRESSION: No edema or consolidation. Electronically Signed   By: Bretta BangWilliam  Woodruff III M.D.   On: 03/22/2016 11:34   Ct Abdomen Pelvis W Contrast  Result Date: 03/24/2016 CLINICAL DATA:  Initial evaluation for acute mid upper abdominal pain for 3 days, worsening. EXAM: CT ABDOMEN AND PELVIS WITH CONTRAST TECHNIQUE: Multidetector CT imaging of the abdomen and pelvis was performed using the standard protocol following bolus administration of intravenous contrast. CONTRAST:  100mL ISOVUE-300 IOPAMIDOL (ISOVUE-300) INJECTION 61% COMPARISON:  None available. FINDINGS: Lower chest: Scattered atelectatic changes present within the visualized lung bases. Visualized lungs are otherwise clear. Hepatobiliary: Liver demonstrates a normal contrast enhanced appearance. Gallbladder within normal limits. No biliary dilatation. Pancreas:  Pancreas within normal limits without acute inflammatory changes. Spleen: Spleen within normal limits. Adrenals/Urinary Tract: Adrenal glands are normal. Kidneys equal in size with symmetric enhancement. No nephrolithiasis, hydronephrosis, or focal enhancing renal mass. Ureters of normal caliber without acute abnormality. No hydroureter. Bladder within normal limits. Stomach/Bowel: Stomach within normal limits. No evidence for bowel obstruction. Appendix well visualized within the right lower quadrant and is of normal caliber and appearance associated inflammatory changes to suggest acute appendicitis. No abnormal wall thickening, mucosal enhancement, or inflammatory fat stranding seen about the bowels. Undo that Vascular/Lymphatic: Normal intravascular enhancement seen throughout the intra-abdominal aorta and its branch vessels. There is an apparent enlarged hypodense porta hepatis lymph node that measures 17 x 29 mm (series 2, image 24). No demonstrates somewhat fuzzy margins. This is indeterminate, and may be reactive in nature. No other pathologically enlarged lymph nodes identified within the abdomen and pelvis. Reproductive: Uterus and ovaries within normal limits. Other: No free air identified. Trace free fluid within the pelvis, likely physiologic. Diastasis of the rectus abdominis musculature without frank aneurysm. Musculoskeletal: No acute osseous abnormality. No worrisome lytic or blastic osseous lesions. IMPRESSION: 1. Single enlarged 2.9 cm porta hepatis lymph node with mild fuzzy soft tissue stranding, suggesting inflammation. While this node is indeterminate, this is favored to most likely be reactive in nature, although no other significant inflammatory changes are identified within the abdomen and pelvis. Finding could be related to an underlying occult enteritis or gastritis. Correlation with LFTs is recommended as well to ensure no underlying liver pathology is present. While possible  lymphoproliferative disorder is not entirely excluded, this is felt to be unlikely given the lack of additional adenopathy within the abdomen and pelvis. If patient's clinical symptoms persist and follow-up imaging is desired, this could potentially be re-evaluated with targeted ultrasound in order to avoid external radiation in this young patient if possible. 2. Otherwise normal CT of the abdomen and pelvis. No other acute process identified. Electronically Signed   By: Rise MuBenjamin  McClintock M.D.   On: 03/24/2016 05:56   [place] Abdomen Limited Ruq  Result Date: 03/22/2016 CLINICAL DATA:  Acute chest pain. EXAM: US ABDOMEN LIMITED - RIGHT UPPER QUADRANT COMPARISON:  None. FINDINGS: Gallbladder: No gallstones or wall thickening visualized. No sonographic Murphy sign noted by sonographer. Common bile duct: Diameter: 4.5 mm which is within normal limits. Liver: No focal lesion identified. Within normal limits in parenchymal echogenicity. IMPRESSION: No abnormality seen in the right upper quadrant of the abdomen. Electronically Signed   By: Lupita RaiderJames  Green Jr, M.D.   On: 03/22/2016 14:23   Procedures Procedures   Medications Ordered in ED Medications - No data to display  Initial Impression / Assessment and Plan / ED Course  I  have reviewed the triage vital signs and the nursing notes.  Pertinent labs results that were available during my care of the patient were reviewed by me and considered in my medical decision making (see chart for details).  Clinical Course   Pt with repeat ER visits for continued abd pain She had significant tenderness on exam Labs reassuring  CT imaging reveals lymphadenopathy but no other acute findings Pt improved on repeat evaluation Advised to continue home meds Also advised for PCP followup and re-evaluation in 2 weeks and may need repeat CT scan Pt agreeable with plan  Final Clinical Impressions(s) / ED Diagnoses   Final diagnoses:  Epigastric abdominal pain   New  Prescriptions New Prescriptions   No medications on file   I personally performed the services described in this documentation, which was scribed in my presence. The recorded information has been reviewed and is accurate.       Zadie Rhine, MD 03/24/16 [phone]

## 2016-05-03 ENCOUNTER — Ambulatory Visit: Payer: Managed Care, Other (non HMO) | Admitting: Nurse Practitioner

## 2016-11-28 ENCOUNTER — Ambulatory Visit (INDEPENDENT_AMBULATORY_CARE_PROVIDER_SITE_OTHER): Payer: BLUE CROSS/BLUE SHIELD | Admitting: Nurse Practitioner

## 2016-11-28 ENCOUNTER — Encounter: Payer: Self-pay | Admitting: Nurse Practitioner

## 2016-11-28 VITALS — BP 110/80 | HR 71 | Temp 98.6°F | Ht 66.0 in | Wt 212.0 lb

## 2016-11-28 DIAGNOSIS — N760 Acute vaginitis: Secondary | ICD-10-CM

## 2016-11-28 DIAGNOSIS — L292 Pruritus vulvae: Secondary | ICD-10-CM | POA: Diagnosis not present

## 2016-11-28 MED ORDER — TALC EX POWD: CUTANEOUS | 0 refills | Status: DC | PRN

## 2016-11-28 MED ORDER — METRONIDAZOLE 500 MG PO TABS: 500.0000 mg | ORAL_TABLET | Freq: Two times a day (BID) | ORAL | 0 refills | Status: DC

## 2016-11-28 MED ORDER — FLUCONAZOLE 150 MG PO TABS: 150.0000 mg | ORAL_TABLET | Freq: Every day | ORAL | 0 refills | Status: DC

## 2016-11-28 NOTE — Patient Instructions (Signed)

## 2016-11-28 NOTE — Progress Notes (Signed)
Subjective:  Patient ID: Casey Hamilton, female    DOB: 31-Oct-1985  Age: 31 y.o. MRN: [id number]  CC: Vaginal Itching (last CPE done 02/01/2016, vaginal itching)   Vaginal Itching  The patient's primary symptoms include genital itching, a genital odor, a genital rash and vaginal discharge. The patient's pertinent negatives include no missed menses or pelvic pain. This is a recurrent problem. The problem occurs constantly. The problem has been unchanged. The patient is experiencing no pain. The problem affects both sides. She is not pregnant. Pertinent negatives include no abdominal pain, anorexia, back pain, chills, constipation, diarrhea, discolored urine, dysuria, fever, joint pain, painful intercourse or urgency. The vaginal discharge was thick and white. Nothing aggravates the symptoms. She has tried nothing for the symptoms. She is sexually active. No, her partner does not have an STD. She uses tubal ligation for contraception. Her menstrual history has been regular. Her past medical history is significant for menorrhagia.   Outpatient Medications Prior to Visit  Medication Sig Dispense Refill  . ibuprofen (ADVIL,MOTRIN) 200 MG tablet Take 200 mg by mouth every 6 (six) hours as needed for moderate pain.    . clotrimazole (GYNE-LOTRIMIN 3) 2 % vaginal cream Place 1 Applicatorful vaginally at bedtime. (Patient not taking: Reported on 11/28/2016) 21 g 0  . fluconazole (DIFLUCAN) 150 MG tablet Take 1 tablet (150 mg total) by mouth daily. Take second tab 3days apart from first tab (Patient not taking: Reported on 11/28/2016) 2 tablet 0  . naproxen (NAPROSYN) 500 MG tablet Take 1 tablet (500 mg total) by mouth 2 (two) times daily. (Patient not taking: Reported on 11/28/2016) 30 tablet 0   No facility-administered medications prior to visit.     ROS See HPI  Objective:  BP 110/80   Pulse 71   Temp 98.6 F (37 C)   Ht 5\' 6"  (1.676 m)   Wt 212 lb (96.2 kg)   LMP 11/28/2016   SpO2 99%    BMI 34.22 kg/m   BP Readings from Last 3 Encounters:  11/28/16 110/80  03/24/16 117/78  03/22/16 119/84    Wt Readings from Last 3 Encounters:  11/28/16 212 lb (96.2 kg)  03/23/16 205 lb (93 kg)  02/01/16 205 lb (93 kg)    Physical Exam  Constitutional: She is oriented to person, place, and time. No distress.  Cardiovascular: Normal rate.   Pulmonary/Chest: Effort normal.  Genitourinary: There is rash on the right labia. There is rash on the left labia. Vaginal discharge found.  Neurological: She is alert and oriented to person, place, and time.  Vitals reviewed.   Lab Results  Component Value Date   WBC 12.4 (H) 03/24/2016   HGB 12.2 03/24/2016   HCT 38.2 03/24/2016   PLT 262 03/24/2016   GLUCOSE 101 (H) 03/24/2016   ALT 28 03/24/2016   AST 21 03/24/2016   NA 138 03/24/2016   K 4.0 03/24/2016   CL 103 03/24/2016   CREATININE 0.80 03/24/2016   BUN 12 03/24/2016   CO2 27 03/24/2016   TSH 0.92 02/01/2016   HGBA1C 6.0 02/01/2016    Ct Abdomen Pelvis W Contrast  Result Date: 03/24/2016 CLINICAL DATA:  Initial evaluation for acute mid upper abdominal pain for 3 days, worsening. EXAM: CT ABDOMEN AND PELVIS WITH CONTRAST TECHNIQUE: Multidetector CT imaging of the abdomen and pelvis was performed using the standard protocol following bolus administration of intravenous contrast. CONTRAST:  ISOVUE-300 IOPAMIDOL (ISOVUE-300) INJECTION 61% COMPARISON:  None available. FINDINGS: Lower  chest: Scattered atelectatic changes present within the visualized lung bases. Visualized lungs are otherwise clear. Hepatobiliary: Liver demonstrates a normal contrast enhanced appearance. Gallbladder within normal limits. No biliary dilatation. Pancreas: Pancreas within normal limits without acute inflammatory changes. Spleen: Spleen within normal limits. Adrenals/Urinary Tract: Adrenal glands are normal. Kidneys equal in size with symmetric enhancement. No nephrolithiasis, hydronephrosis, or  focal enhancing renal mass. Ureters of normal caliber without acute abnormality. No hydroureter. Bladder within normal limits. Stomach/Bowel: Stomach within normal limits. No evidence for bowel obstruction. Appendix well visualized within the right lower quadrant and is of normal caliber and appearance associated inflammatory changes to suggest acute appendicitis. No abnormal wall thickening, mucosal enhancement, or inflammatory fat stranding seen about the bowels. Undo that Vascular/Lymphatic: Normal intravascular enhancement seen throughout the intra-abdominal aorta and its branch vessels. There is an apparent enlarged hypodense porta hepatis lymph node that measures 17 x 29 mm (series 2, image 24). No demonstrates somewhat fuzzy margins. This is indeterminate, and may be reactive in nature. No other pathologically enlarged lymph nodes identified within the abdomen and pelvis. Reproductive: Uterus and ovaries within normal limits. Other: No free air identified. Trace free fluid within the pelvis, likely physiologic. Diastasis of the rectus abdominis musculature without frank aneurysm. Musculoskeletal: No acute osseous abnormality. No worrisome lytic or blastic osseous lesions. IMPRESSION: 1. Single enlarged 2.9 cm porta hepatis lymph node with mild fuzzy soft tissue stranding, suggesting inflammation. While this node is indeterminate, this is favored to most likely be reactive in nature, although no other significant inflammatory changes are identified within the abdomen and pelvis. Finding could be related to an underlying occult enteritis or gastritis. Correlation with LFTs is recommended as well to ensure no underlying liver pathology is present. While possible lymphoproliferative disorder is not entirely excluded, this is felt to be unlikely given the lack of additional adenopathy within the abdomen and pelvis. If patient's clinical symptoms persist and follow-up imaging is desired, this could potentially be  re-evaluated with targeted ultrasound in order to avoid external radiation in this young patient if possible. 2. Otherwise normal CT of the abdomen and pelvis. No other acute process identified. Electronically Signed   By: Rise MuBenjamin  McClintock M.D.   On: 03/24/2016 05:56    Assessment & Plan:   Casey Boerlisia was seen today for vaginal itching.  Diagnoses and all orders for this visit:  Vulvar itching -     talc powder; Apply topically as needed.  Vaginosis -     metroNIDAZOLE (FLAGYL) 500 MG tablet; Take 1 tablet (500 mg total) by mouth 2 (two) times daily. -     fluconazole (DIFLUCAN) 150 MG tablet; Take 1 tablet (150 mg total) by mouth daily. Take second tab 3days apart from first tab   I have discontinued Ms. White's naproxen, clotrimazole, and ibuprofen. I am also having her start on metroNIDAZOLE and talc. Additionally, I am having her maintain her fluconazole.  Meds ordered this encounter  Medications  . metroNIDAZOLE (FLAGYL) 500 MG tablet    Sig: Take 1 tablet (500 mg total) by mouth 2 (two) times daily.    Dispense:  10 tablet    Refill:  0    Order Specific Question:   Supervising Provider    Answer:   Tresa GarterPLOTNIKOV, ALEKSEI V [[id number]]  . talc powder    Sig: Apply topically as needed.    Dispense:  240 g    Refill:  0    Order Specific Question:   Supervising Provider  Answer:   Tresa Garter [[id number]]  . fluconazole (DIFLUCAN) 150 MG tablet    Sig: Take 1 tablet (150 mg total) by mouth daily. Take second tab 3days apart from first tab    Dispense:  2 tablet    Refill:  0    Order Specific Question:   Supervising Provider    Answer:   Tresa Garter [[id number]]    Follow-up: Return in about 2 months (around 01/31/2017) for CPE (fasting).  Alysia Penna, NP

## 2017-01-23 ENCOUNTER — Encounter: Payer: BLUE CROSS/BLUE SHIELD | Admitting: Nurse Practitioner

## 2017-02-20 ENCOUNTER — Other Ambulatory Visit (INDEPENDENT_AMBULATORY_CARE_PROVIDER_SITE_OTHER): Payer: 59

## 2017-02-20 ENCOUNTER — Ambulatory Visit (INDEPENDENT_AMBULATORY_CARE_PROVIDER_SITE_OTHER): Payer: 59 | Admitting: Nurse Practitioner

## 2017-02-20 ENCOUNTER — Encounter: Payer: Self-pay | Admitting: Nurse Practitioner

## 2017-02-20 ENCOUNTER — Other Ambulatory Visit: Payer: 59

## 2017-02-20 ENCOUNTER — Other Ambulatory Visit (HOSPITAL_COMMUNITY)
Admission: RE | Admit: 2017-02-20 | Discharge: 2017-02-20 | Disposition: A | Discharge status: Home / Self Care | Payer: 59 | Source: Ambulatory Visit | Attending: Nurse Practitioner | Admitting: Nurse Practitioner

## 2017-02-20 VITALS — BP 122/82 | HR 83 | Temp 97.8°F | Ht 66.0 in | Wt 215.0 lb

## 2017-02-20 DIAGNOSIS — Z136 Encounter for screening for cardiovascular disorders: Secondary | ICD-10-CM

## 2017-02-20 DIAGNOSIS — Z124 Encounter for screening for malignant neoplasm of cervix: Secondary | ICD-10-CM | POA: Insufficient documentation

## 2017-02-20 DIAGNOSIS — Z0001 Encounter for general adult medical examination with abnormal findings: Secondary | ICD-10-CM

## 2017-02-20 DIAGNOSIS — R7303 Prediabetes: Secondary | ICD-10-CM | POA: Diagnosis not present

## 2017-02-20 DIAGNOSIS — N760 Acute vaginitis: Secondary | ICD-10-CM

## 2017-02-20 DIAGNOSIS — N898 Other specified noninflammatory disorders of vagina: Secondary | ICD-10-CM

## 2017-02-20 DIAGNOSIS — Z13220 Encounter for screening for lipoid disorders: Secondary | ICD-10-CM

## 2017-02-20 DIAGNOSIS — Z23 Encounter for immunization: Secondary | ICD-10-CM

## 2017-02-20 LAB — COMPREHENSIVE METABOLIC PANEL
ALK PHOS: 66 U/L (ref 39–117)
ALT: 18 U/L (ref 0–35)
AST: 15 U/L (ref 0–37)
Albumin: 4.2 g/dL (ref 3.5–5.2)
BUN: 11 mg/dL (ref 6–23)
CHLORIDE: 102 meq/L (ref 96–112)
CO2: 29 meq/L (ref 19–32)
CREATININE: 0.74 mg/dL (ref 0.40–1.20)
Calcium: 9.7 mg/dL (ref 8.4–10.5)
GFR: 116.64 mL/min (ref >60.00)
Glucose, Bld: 95 mg/dL (ref 70–99)
Potassium: 3.8 mEq/L (ref 3.5–5.1)
Sodium: 137 mEq/L (ref 135–145)
Total Bilirubin: 0.4 mg/dL (ref 0.2–1.2)
Total Protein: 7.7 g/dL (ref 6.0–8.3)

## 2017-02-20 LAB — LIPID PANEL
CHOL/HDL RATIO: 2
Cholesterol: 139 mg/dL (ref 0–200)
HDL: 59.5 mg/dL (ref >39.00)
LDL Cholesterol: 64 mg/dL (ref 0–99)
NONHDL: 79.48
Triglycerides: 75 mg/dL (ref 0.0–149.0)
VLDL: 15 mg/dL (ref 0.0–40.0)

## 2017-02-20 LAB — TSH: TSH: 0.97 u[IU]/mL (ref 0.35–4.50)

## 2017-02-20 LAB — WET PREP, GENITAL
Bacteria: NONE SEEN — AB
Clue Cells Wet Prep HPF POC: NONE SEEN — AB
TRICH WET PREP: NONE SEEN — AB
YEAST WET PREP: NONE SEEN — AB

## 2017-02-20 LAB — HEMOGLOBIN A1C: HEMOGLOBIN A1C: 6.2 % (ref 4.6–6.5)

## 2017-02-20 NOTE — Patient Instructions (Addendum)
Wet prep indicates positive WBC but negative for BV, yeast, and trichomoniasis. Pending GC/chlamydia and thin prep.  Stable lab results with mild increase if HgbA1c. It is improtant to incorporate healthy diet and regular exercise to avoid diabetes  Preventive Care 18-39 Years, Female Preventive care refers to lifestyle choices and visits with your health care provider that can promote health and wellness. What does preventive care include?  A yearly physical exam. This is also called an annual well check.  Dental exams once or twice a year.  Routine eye exams. Ask your health care provider how often you should have your eyes checked.  Personal lifestyle choices, including: ? Daily care of your teeth and gums. ? Regular physical activity. ? Eating a healthy diet. ? Avoiding tobacco and drug use. ? Limiting alcohol use. ? Practicing safe sex. ? Taking vitamin and mineral supplements as recommended by your health care provider. What happens during an annual well check? The services and screenings done by your health care provider during your annual well check will depend on your age, overall health, lifestyle risk factors, and family history of disease. Counseling Your health care provider may ask you questions about your:  Alcohol use.  Tobacco use.  Drug use.  Emotional well-being.  Home and relationship well-being.  Sexual activity.  Eating habits.  Work and work Statistician.  Method of birth control.  Menstrual cycle.  Pregnancy history.  Screening You may have the following tests or measurements:  Height, weight, and BMI.  Diabetes screening. This is done by checking your blood sugar (glucose) after you have not eaten for a while (fasting).  Blood pressure.  Lipid and cholesterol levels. These may be checked every 5 years starting at age 32.  Skin check.  Hepatitis C blood test.  Hepatitis B blood test.  Sexually transmitted disease (STD)  testing.  BRCA-related cancer screening. This may be done if you have a family history of breast, ovarian, tubal, or peritoneal cancers.  Pelvic exam and Pap test. This may be done every 3 years starting at age 30. Starting at age 11, this may be done every 5 years if you have a Pap test in combination with an HPV test.  Discuss your test results, treatment options, and if necessary, the need for more tests with your health care provider. Vaccines Your health care provider may recommend certain vaccines, such as:  Influenza vaccine. This is recommended every year.  Tetanus, diphtheria, and acellular pertussis (Tdap, Td) vaccine. You may need a Td booster every 10 years.  Varicella vaccine. You may need this if you have not been vaccinated.  HPV vaccine. If you are 38 or younger, you may need three doses over 6 months.  Measles, mumps, and rubella (MMR) vaccine. You may need at least one dose of MMR. You may also need a second dose.  Pneumococcal 13-valent conjugate (PCV13) vaccine. You may need this if you have certain conditions and were not previously vaccinated.  Pneumococcal polysaccharide (PPSV23) vaccine. You may need one or two doses if you smoke cigarettes or if you have certain conditions.  Meningococcal vaccine. One dose is recommended if you are age 11-21 years and a first-year college student living in a residence hall, or if you have one of several medical conditions. You may also need additional booster doses.  Hepatitis A vaccine. You may need this if you have certain conditions or if you travel or work in places where you may be exposed to hepatitis A.  Hepatitis B vaccine. You may need this if you have certain conditions or if you travel or work in places where you may be exposed to hepatitis B.  Haemophilus influenzae type b (Hib) vaccine. You may need this if you have certain risk factors.  Talk to your health care provider about which screenings and vaccines you  need and how often you need them. This information is not intended to replace advice given to you by your health care provider. Make sure you discuss any questions you have with your health care provider. Document Released: 07/19/2001 Document Revised: 02/10/2016 Document Reviewed: 03/24/2015 Elsevier Interactive Patient Education  2017 [employer].

## 2017-02-20 NOTE — Progress Notes (Signed)
Subjective:    Patient ID: Casey Hamilton, female    DOB: August 31, 1985, 31 y.o.   MRN: [id number]  Patient presents today for complete physical.  HPI  Immunizations: (TDAP, Hep C screen, Pneumovax, Influenza, zoster)  Health Maintenance  Topic Date Due  . Pap Smear  02/01/2019  . Tetanus Vaccine  01/31/2026  . Flu Shot  Completed  . HIV Screening  Completed   Diet:regular.  Weight:  Wt Readings from Last 3 Encounters:  02/20/17 215 lb (97.5 kg)  11/28/16 212 lb (96.2 kg)  03/23/16 205 lb (93 kg)   Exercise:none.  Fall Risk: Fall Risk  02/20/2017 11/28/2016  Falls in the past year? No No   Home Safety:home with husband and child, has fire alarm, wears seatbelts.  Depression/Suicide: Depression screen [facility] 2/9 02/20/2017 11/28/2016  Decreased Interest 0 0  Down, Depressed, Hopeless 0 0  PHQ - 2 Score 0 0   Pap Smear (every 56yrs for >21-29 without HPV, every 42yrs for >30-54yrs with HPV):needed.  Vision: up to date.  Dental:up to date.  Advanced Directive: Advanced Directives 03/23/2016  Does Patient Have a Medical Advance Directive? No  Would patient like information on creating a medical advance directive? No - patient declined information  Pre-existing out of facility DNR order (yellow form or pink MOST form) -   Sexual History (birth control, marital status, STD):married, sexually active, s/ptubal ligation, LMP 02/02/2017, regular per patient.  Medications and allergies reviewed with patient and updated if appropriate.  Patient Active Problem List   Diagnosis Date Noted  . Prediabetes 02/20/2017  . Atopic dermatitis 02/01/2016  . Other seasonal allergic rhinitis 02/01/2016  . Menorrhagia with regular cycle 02/01/2016  . S/P tubal ligation 12/05/2013  . Anemia 12/05/2013  . Rectal bleeding - consult to GI; neg eval 12/03/2013    Current Outpatient Prescriptions on File Prior to Visit  Medication Sig Dispense Refill  . talc powder Apply topically as  needed. 240 g 0   No current facility-administered medications on file prior to visit.     Past Medical History:  Diagnosis Date  . Allergy   . Anemia   . Contact dermatitis   . Medical history non-contributory     Past Surgical History:  Procedure Laterality Date  . FOOT SURGERY Right   . TUBAL LIGATION Bilateral 12/04/2013   Procedure: POST PARTUM TUBAL LIGATION;  Surgeon: Kirkland Hun, MD;  Location: [facility];  Service: Gynecology;  Laterality: Bilateral;  . WISDOM TOOTH EXTRACTION      Social History   Social History  . Marital status: Married    Spouse name: N/A  . Number of children: 2  . Years of education: N/A   Social History Main Topics  . Smoking status: Never Smoker  . Smokeless tobacco: Never Used  . Alcohol use Yes     Comment: cocktail every few months  . Drug use: No  . Sexual activity: Yes    Birth control/ protection: Surgical     Comment: s/p tubal ligation   Other Topics Concern  . None   Social History Narrative  . None    Family History  Problem Relation Age of Onset  . Hypertension Mother   . COPD Maternal Grandfather   . Diabetes Maternal Grandfather   . Epilepsy Paternal Grandmother   . Breast cancer Maternal Aunt   . Prostate cancer Maternal Uncle         Review of Systems  Constitutional: Negative for fever, malaise/fatigue  and weight loss.  HENT: Negative for congestion and sore throat.   Eyes:       Negative for visual changes  Respiratory: Negative for cough and shortness of breath.   Cardiovascular: Negative for chest pain, palpitations and leg swelling.  Gastrointestinal: Negative for blood in stool, constipation, diarrhea and heartburn.  Genitourinary: Negative for dysuria, frequency and urgency.  Musculoskeletal: Negative for falls, joint pain and myalgias.  Skin: Negative for rash.  Neurological: Negative for dizziness, sensory change and headaches.  Endo/Heme/Allergies: Does not bruise/bleed easily.    Psychiatric/Behavioral: Negative for depression, substance abuse and suicidal ideas. The patient is not nervous/anxious.     Objective:   Vitals:   02/20/17 1129  BP: 122/82  Pulse: 83  Temp: 97.8 F (36.6 C)  SpO2: 98%    Body mass index is 34.7 kg/m.   Physical Examination:  Physical Exam  Constitutional: She is oriented to person, place, and time and well-developed, well-nourished, and in no distress. No distress.  HENT:  Right Ear: External ear normal.  Left Ear: External ear normal.  Nose: Nose normal.  Mouth/Throat: No oropharyngeal exudate.  Eyes: Pupils are equal, round, and reactive to light. Conjunctivae and EOM are normal. No scleral icterus.  Neck: Normal range of motion. Neck supple. No thyromegaly present.  Cardiovascular: Normal rate, regular rhythm, normal heart sounds and intact distal pulses.   Pulmonary/Chest: Effort normal and breath sounds normal. Right breast exhibits no mass, no nipple discharge and no skin change. Left breast exhibits no mass, no nipple discharge and no skin change. Breasts are symmetrical.  Abdominal: Soft. Bowel sounds are normal. She exhibits no distension. There is no tenderness.  Genitourinary: Rectum normal, cervix normal, right adnexa normal, left adnexa normal and vulva normal. Cervix exhibits no motion tenderness. Thick  creamy  odorless  white and vaginal discharge found.  Musculoskeletal: Normal range of motion. She exhibits no edema or tenderness.  Lymphadenopathy:    She has no cervical adenopathy.  Neurological: She is alert and oriented to person, place, and time. Gait normal.  Skin: Skin is warm and dry.  Psychiatric: Affect and judgment normal.  Vitals reviewed.   ASSESSMENT and PLAN:  Casey Hamilton was seen today for annual exam.  Diagnoses and all orders for this visit:  Encounter for preventative adult health care exam with abnormal findings -     Cytology - PAP; Future -     Comprehensive metabolic panel;  Future -     Lipid panel; Future -     TSH; Future -     Hemoglobin A1c; Future  Vaginal discharge -     Wet prep, genital; Future  Encounter for lipid screening for cardiovascular disease -     Lipid panel; Future  Encounter for Papanicolaou smear for cervical cancer screening -     Cytology - PAP; Future  Prediabetes -     Hemoglobin A1c; Future  Need for influenza vaccination -     Flu Vaccine QUAD 36+ mos IM    No problem-specific Assessment & Plan notes found for this encounter.      Follow up: Return if symptoms worsen or fail to improve.  Alysia Penna, NP

## 2017-02-21 LAB — CYTOLOGY - PAP
CHLAMYDIA, DNA PROBE: NEGATIVE
Diagnosis: NEGATIVE
NEISSERIA GONORRHEA: NEGATIVE
TRICH (WINDOWPATH): NEGATIVE

## 2017-02-21 MED ORDER — FLUCONAZOLE 150 MG PO TABS: 150.0000 mg | ORAL_TABLET | Freq: Every day | ORAL | 0 refills | Status: DC

## 2017-06-19 ENCOUNTER — Other Ambulatory Visit: Payer: Self-pay | Admitting: Student

## 2017-06-19 DIAGNOSIS — N644 Mastodynia: Secondary | ICD-10-CM

## 2017-06-23 ENCOUNTER — Other Ambulatory Visit: Payer: 59

## 2017-06-26 ENCOUNTER — Other Ambulatory Visit: Payer: 59

## 2017-07-03 ENCOUNTER — Other Ambulatory Visit: Payer: Self-pay

## 2017-10-24 ENCOUNTER — Ambulatory Visit (HOSPITAL_COMMUNITY)
Admission: EM | Admit: 2017-10-24 | Discharge: 2017-10-24 | Disposition: A | Discharge status: Home / Self Care | Payer: Managed Care, Other (non HMO) | Attending: Family Medicine | Admitting: Family Medicine

## 2017-10-24 ENCOUNTER — Encounter (HOSPITAL_COMMUNITY): Payer: Self-pay | Admitting: Family Medicine

## 2017-10-24 DIAGNOSIS — S0502XA Injury of conjunctiva and corneal abrasion without foreign body, left eye, initial encounter: Secondary | ICD-10-CM

## 2017-10-24 MED ORDER — GENTAMICIN SULFATE 0.3 % OP SOLN: 2.0000 [drp] | OPHTHALMIC | 0 refills | Status: DC

## 2017-10-24 MED ORDER — TETRACAINE HCL 0.5 % OP SOLN
OPHTHALMIC | Status: AC
  Filled 2017-10-24: qty 4

## 2017-10-24 NOTE — ED Triage Notes (Signed)
Pt here for left eye redness, watery, and itchiness since this am.

## 2017-10-24 NOTE — ED Provider Notes (Signed)
[facility]    CSN: [id number] Arrival date & time: 10/24/17  1009     History   Chief Complaint Chief Complaint  Patient presents with  . Eye Problem    HPI Casey Hamilton is a 32 y.o. female.   32 year old female presents with left eye redness, irritation and swelling today. She woke up with her eye irritated and watering. She removed her contact and flushed her eye with saline solution and her left eye continues to be painful, red and watery. Denies any yellowish or crusty discharge. Right eye is asymptomatic- she is still wearing her right contact. Denies any fever, nasal congestion, cough or GI symptoms. No other chronic health issues. Takes no daily medication.   The history is provided by the patient.    Past Medical History:  Diagnosis Date  . Allergy   . Anemia   . Contact dermatitis   . Medical history non-contributory     Patient Active Problem List   Diagnosis Date Noted  . Prediabetes 02/20/2017  . Atopic dermatitis 02/01/2016  . Other seasonal allergic rhinitis 02/01/2016  . Menorrhagia with regular cycle 02/01/2016  . S/P tubal ligation 12/05/2013  . Anemia 12/05/2013  . Rectal bleeding - consult to GI; neg eval 12/03/2013    Past Surgical History:  Procedure Laterality Date  . FOOT SURGERY Right   . TUBAL LIGATION Bilateral 12/04/2013   Procedure: POST PARTUM TUBAL LIGATION;  Surgeon: Kirkland Hun, MD;  Location: [facility];  Service: Gynecology;  Laterality: Bilateral;  . WISDOM TOOTH EXTRACTION      OB History    Gravida  3   Para  3   Term  3   Preterm      AB      Living  3     SAB      TAB      Ectopic      Multiple      Live Births  1            Home Medications    Prior to Admission medications   Medication Sig Start Date End Date Taking? Authorizing Provider  gentamicin (GARAMYCIN) 0.3 % ophthalmic solution Place 2 drops into the left eye every 4 (four) hours. While awake For up to 5 days 10/24/17    Sudie Grumbling, NP  talc powder Apply topically as needed. 11/28/16   Nche, Bonna Gains, NP    Family History Family History  Problem Relation Age of Onset  . Hypertension Mother   . COPD Maternal Grandfather   . Diabetes Maternal Grandfather   . Epilepsy Paternal Grandmother   . Breast cancer Maternal Aunt   . Prostate cancer Maternal Uncle     Social History Social History   Tobacco Use  . Smoking status: Never Smoker  . Smokeless tobacco: Never Used  Substance Use Topics  . Alcohol use: Yes    Comment: cocktail every few months  . Drug use: No     Allergies   Amoxicillin   Review of Systems Review of Systems  Constitutional: Negative for appetite change, chills, fatigue and fever.  HENT: Negative for congestion, ear discharge, ear pain, mouth sores, nosebleeds, postnasal drip, rhinorrhea, sinus pressure, sinus pain, sneezing, sore throat and trouble swallowing.   Eyes: Positive for pain, discharge (watery- left eye only), redness and itching (slight). Negative for photophobia and visual disturbance.  Respiratory: Negative for cough, chest tightness, shortness of breath and wheezing.  Gastrointestinal: Negative for nausea and vomiting.  Musculoskeletal: Negative for arthralgias, myalgias, neck pain and neck stiffness.  Skin: Negative for rash and wound.  Neurological: Negative for dizziness, seizures, syncope, weakness, light-headedness and headaches.  Hematological: Negative for adenopathy. Does not bruise/bleed easily.     Physical Exam Triage Vital Signs ED Triage Vitals [10/24/17 1042]  Enc Vitals Group     BP 127/86     Pulse Rate 77     Resp 18     Temp 98.2 F (36.8 C)     Temp src      SpO2 97 %     Weight      Height      Head Circumference      Peak Flow      Pain Score      Pain Loc      Pain Edu?      Excl. in GC?    No data found.  Updated Vital Signs BP 127/86   Pulse 77   Temp 98.2 F (36.8 C)   Resp 18   LMP 09/27/2017    SpO2 97%   Visual Acuity Right Eye Distance:   Left Eye Distance:   Bilateral Distance:    Right Eye Near:   20/20  Left Eye Near:    20/40  Bilateral Near:    20/20   Physical Exam  Constitutional: She is oriented to person, place, and time. Vital signs are normal. She appears well-developed and well-nourished. No distress.  Patient sitting comfortably on exam table but left eye is tearing.   HENT:  Head: Normocephalic and atraumatic. Head is without right periorbital erythema and without left periorbital erythema.  Right Ear: Hearing, tympanic membrane, external ear and ear canal normal.  Left Ear: Hearing, tympanic membrane, external ear and ear canal normal.  Nose: No mucosal edema or rhinorrhea. Right sinus exhibits no maxillary sinus tenderness and no frontal sinus tenderness. Left sinus exhibits no maxillary sinus tenderness and no frontal sinus tenderness.  Mouth/Throat: Uvula is midline, oropharynx is clear and moist and mucous membranes are normal.  Eyes: Pupils are equal, round, and reactive to light. EOM are normal. Right eye exhibits no chemosis, no discharge, no exudate and no hordeolum. No foreign body present in the right eye. Left eye exhibits chemosis and discharge. Left eye exhibits no exudate and no hordeolum. No foreign body present in the left eye. Right conjunctiva is not injected. Left conjunctiva is injected. No scleral icterus.  Fundoscopic exam:      The right eye shows no hemorrhage and no papilledema. The right eye shows red reflex.       The left eye shows no hemorrhage and no papilledema. The left eye shows red reflex.  Slit lamp exam:      The right eye shows no corneal flare, no corneal ulcer and no foreign body.       The left eye shows corneal abrasion. The left eye shows no corneal flare, no corneal ulcer and no foreign body.    Left eye upper lid swollen, no distinct redness or tenderness.   Used Tetracaine to anesthetize left eye- used fluorescein  - 3 small corneal abrasions noted. 2 around 3 o'clock and 1 around 10 o'clock.    Neck: Normal range of motion. Neck supple.  Cardiovascular: Normal rate.  Pulmonary/Chest: Effort normal.  Musculoskeletal: Normal range of motion.  Lymphadenopathy:    She has no cervical adenopathy.  Neurological: She is alert and oriented  to person, place, and time.  Skin: Skin is warm and dry. Capillary refill takes less than 2 seconds. No erythema.  Psychiatric: She has a normal mood and affect. Her behavior is normal. Judgment and thought content normal.  Vitals reviewed.    UC Treatments / Results  Labs (all labs ordered are listed, but only abnormal results are displayed) Labs Reviewed - No data to display  EKG None  Radiology No results found.  Procedures Procedures (including critical care time)  Medications Ordered in UC Medications - No data to display  Initial Impression / Assessment and Plan / UC Course  I have reviewed the triage vital signs and the nursing notes.  Pertinent labs & imaging results that were available during my care of the patient were reviewed by me and considered in my medical decision making (see chart for details).    Discussed that she has scratched her left cornea- may be due to contact lens. Discussed that abrasion usually heals on own within 24 to 48 hours. Will prescribe Gentamicin eye drops to prevent infection- use for next 4 to 5 days. May continue flushing eye with saline and apply cool compresses to area for comfort. Do not wear left contact for at least 7 days. Recommend follow-up with her eye doctor within 48 hours if not improving or sooner if symptoms worsen.  Final Clinical Impressions(s) / UC Diagnoses   Final diagnoses:  Abrasion of left cornea, initial encounter     Discharge Instructions     May apply Gentamicin eye drops every 4 to 6 hours as needed to help prevent infection. May continue to use Saline eye drops to help with comfort.  Recommend apply cool compresses to left eyelid to help with swelling and comfort. Do not put in a new left contact for at least 7 days. Follow-up with your eye doctor within 48 hours if not improving.     ED Prescriptions    Medication Sig Dispense Auth. Provider   gentamicin (GARAMYCIN) 0.3 % ophthalmic solution Place 2 drops into the left eye every 4 (four) hours. While awake For up to 5 days 5 mL Amyot, Ali Lowe, NP     Controlled Substance Prescriptions [place] Controlled Substance Registry consulted? Not Applicable   Sudie Grumbling, NP 10/24/17 2016

## 2017-10-24 NOTE — Discharge Instructions (Addendum)
May apply Gentamicin eye drops every 4 to 6 hours as needed to help prevent infection. May continue to use Saline eye drops to help with comfort. Recommend apply cool compresses to left eyelid to help with swelling and comfort. Do not put in a new left contact for at least 7 days. Follow-up with your eye doctor within 48 hours if not improving.

## 2017-12-11 IMAGING — DX DG CHEST 2V
2 series · 2 of 2 positions shown · non-contrast
Comparison: November 19, 2014

CLINICAL DATA: Chest pain

EXAM:
CHEST  2 VIEW

[w chest pa]
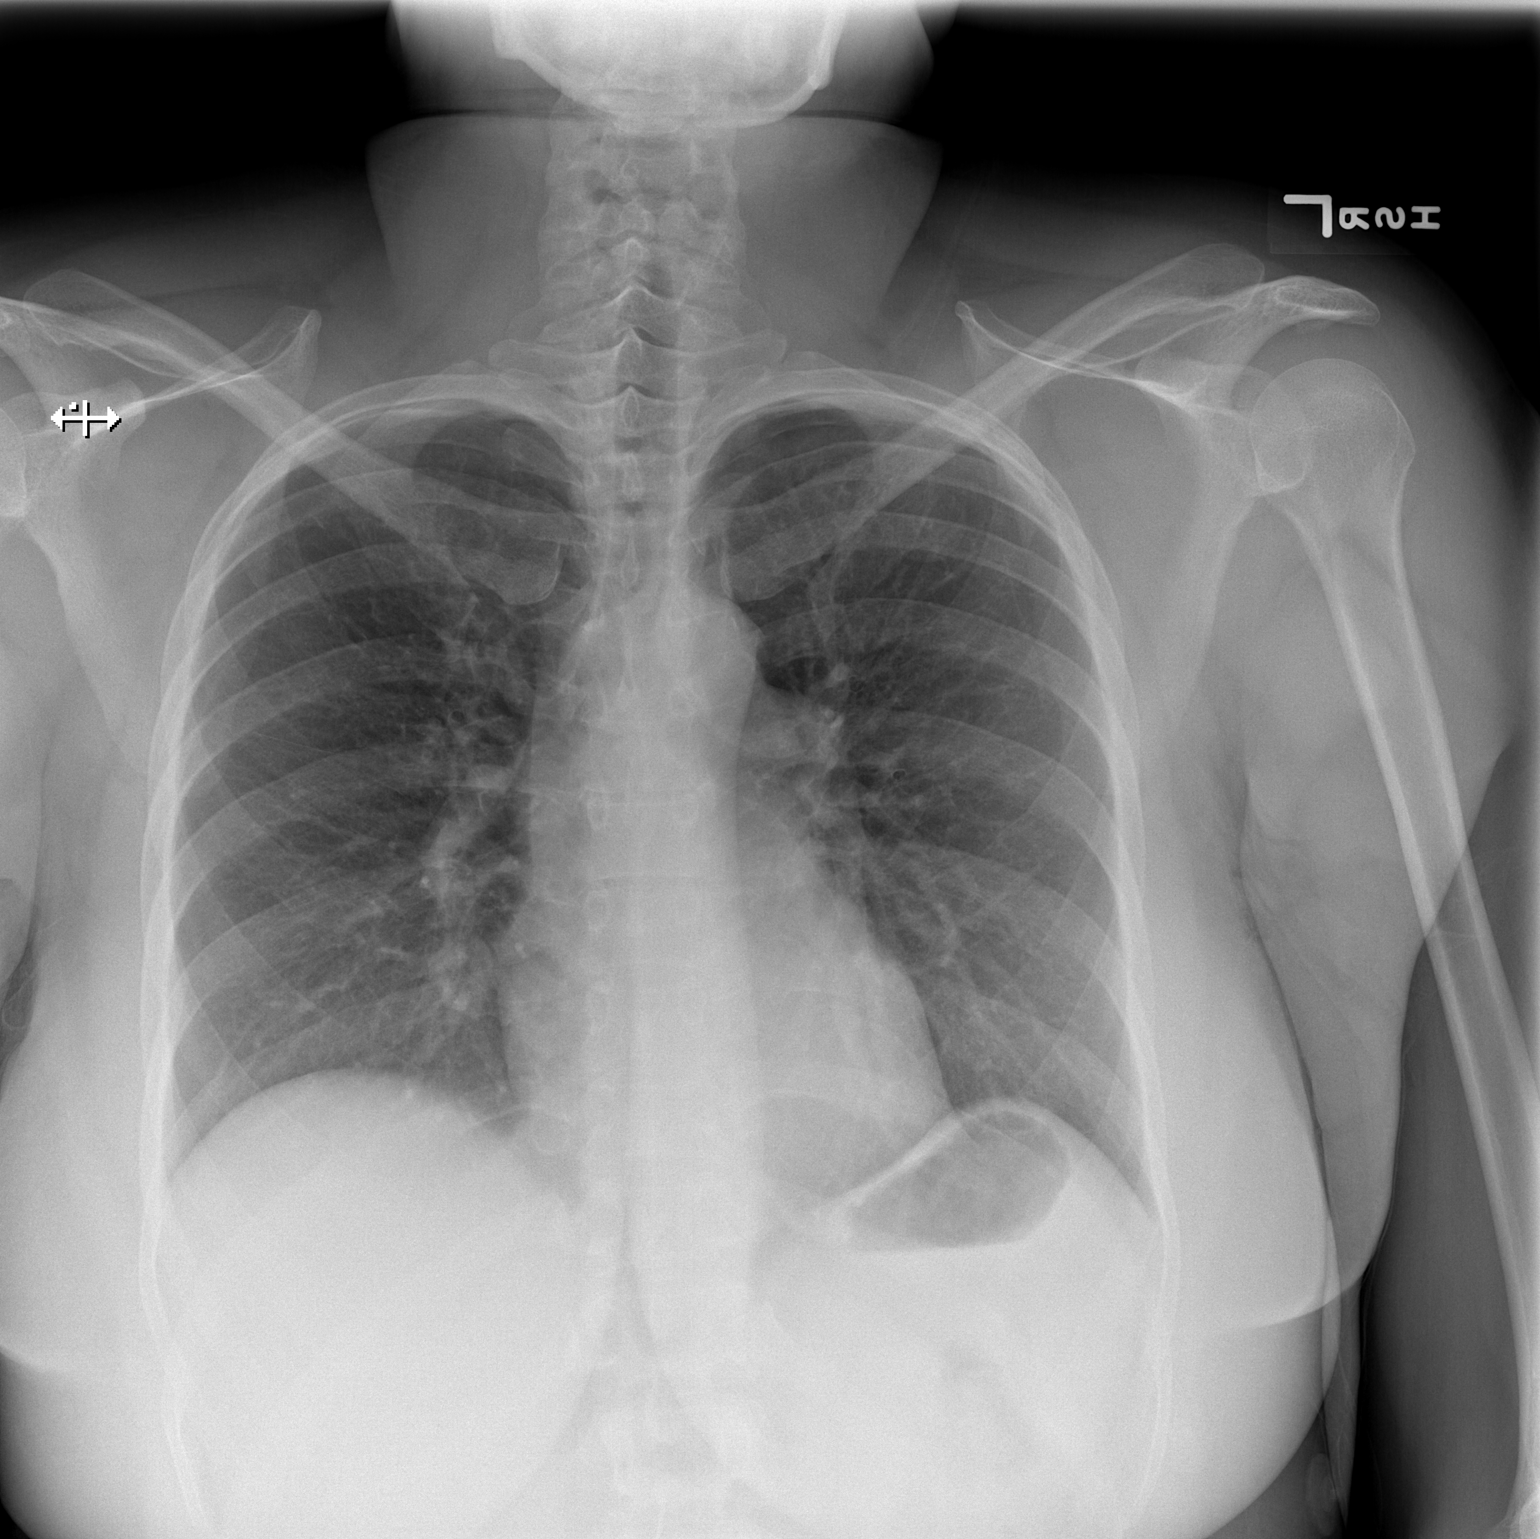

[w chest lat]
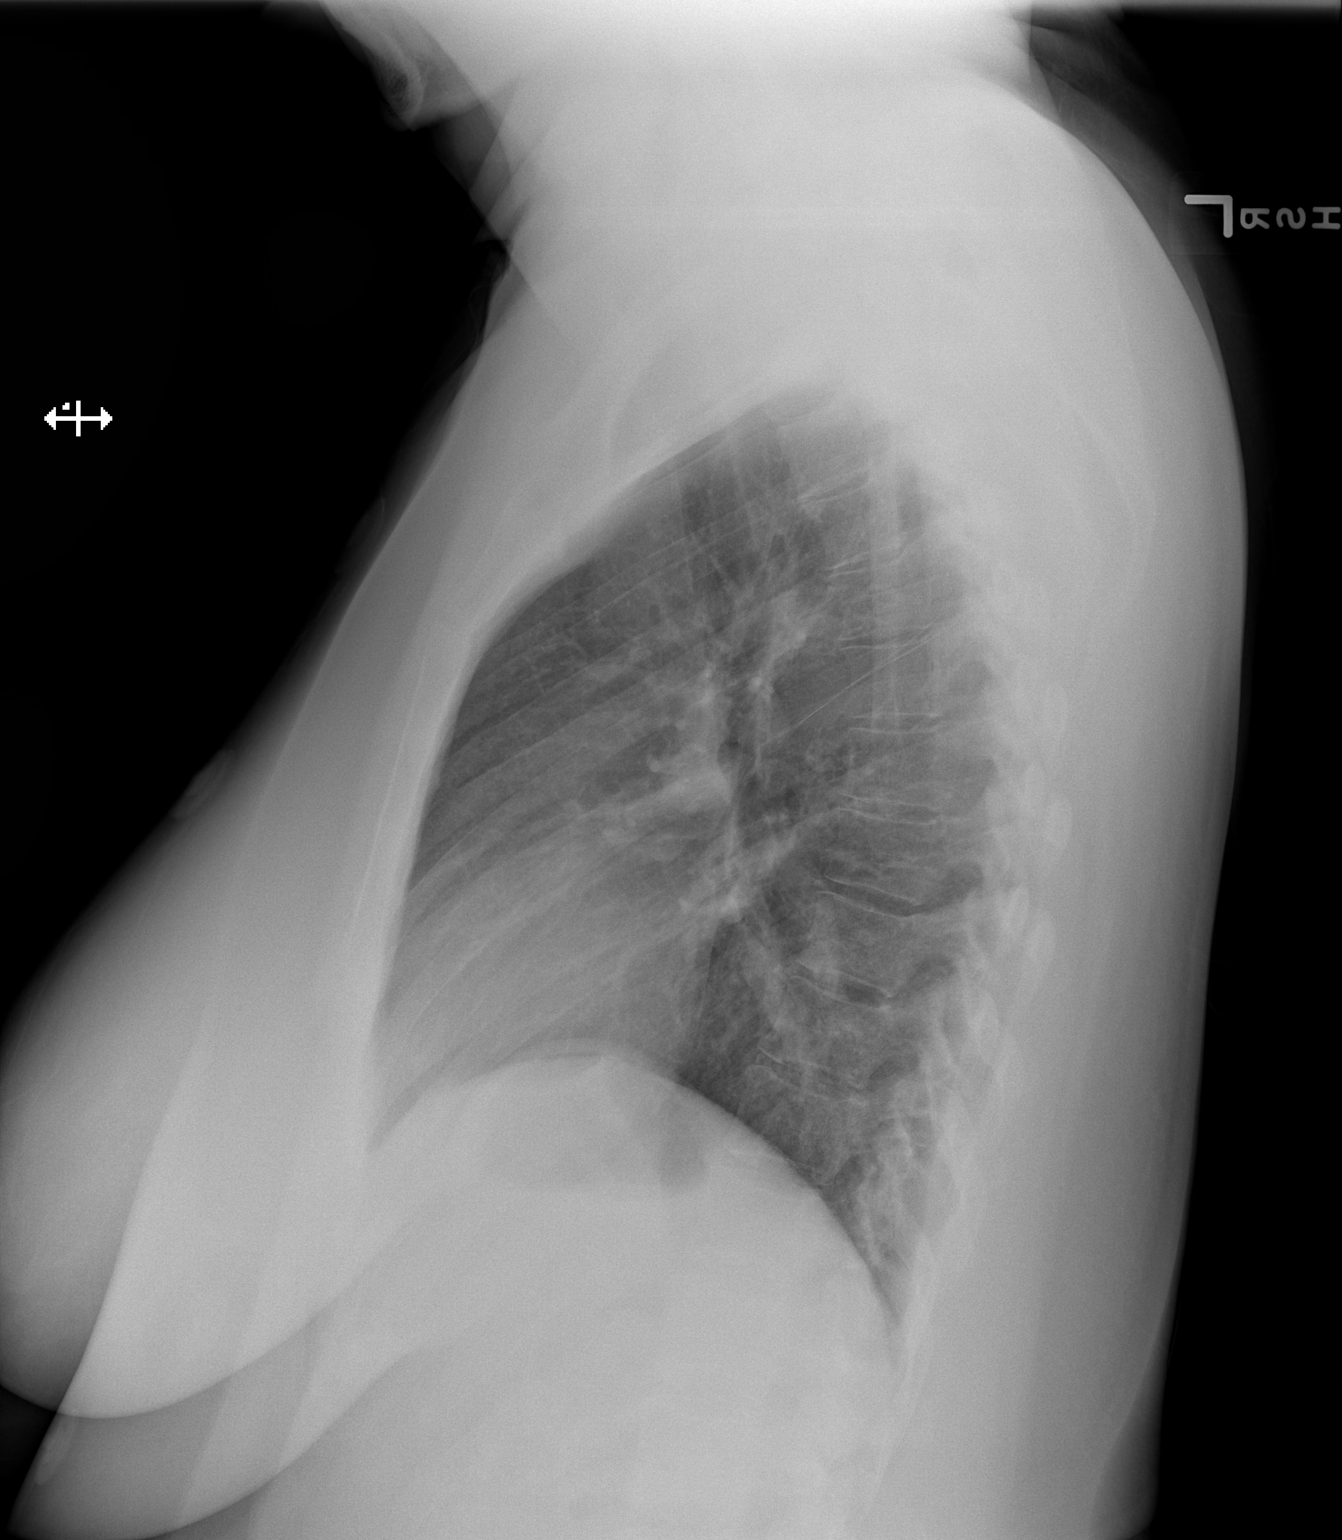

[2 of 2 positions shown; findings below may reference images not displayed]

FINDINGS: Lungs are clear. Heart size and pulmonary vascularity are normal. No
adenopathy. No bone lesions. No pneumothorax.
IMPRESSION: No edema or consolidation.

## 2018-01-26 ENCOUNTER — Encounter: Payer: Self-pay | Admitting: Family Medicine

## 2018-01-26 ENCOUNTER — Ambulatory Visit (INDEPENDENT_AMBULATORY_CARE_PROVIDER_SITE_OTHER): Payer: Managed Care, Other (non HMO) | Admitting: Family Medicine

## 2018-01-26 VITALS — BP 111/76 | HR 75 | Temp 98.4°F | Resp 17 | Ht 67.5 in | Wt 214.0 lb

## 2018-01-26 DIAGNOSIS — R7303 Prediabetes: Secondary | ICD-10-CM | POA: Diagnosis not present

## 2018-01-26 DIAGNOSIS — Z6833 Body mass index (BMI) 33.0-33.9, adult: Secondary | ICD-10-CM | POA: Diagnosis not present

## 2018-01-26 DIAGNOSIS — Z833 Family history of diabetes mellitus: Secondary | ICD-10-CM | POA: Diagnosis not present

## 2018-01-26 DIAGNOSIS — E6609 Other obesity due to excess calories: Secondary | ICD-10-CM | POA: Diagnosis not present

## 2018-01-26 LAB — HEMOGLOBIN A1C
Est. average glucose Bld gHb Est-mCnc: 131 mg/dL
Hgb A1c MFr Bld: 6.2 % — ABNORMAL HIGH (ref 4.8–5.6)

## 2018-01-26 NOTE — Progress Notes (Signed)
8/23/20199:37 AM  Casey Hamilton [birth year], 32 y.o. female [id number]  Chief Complaint  Patient presents with  . Establish Care    HPI:   Patient is a 32 y.o. female with past medical history significant for prediabetes who presents today to establish care  No gestational diabetes Prediabetic since 2017 Last a1c 6.2 She tries to exercise on days off Does not follow a diet Has not lost weight Has never meet with a nutritionist Very strong fhx of diabetes  Fall Risk  01/26/2018 02/20/2017 11/28/2016  Falls in the past year? No No No     [facility] 01/26/2018 02/20/2017 11/28/2016  Decreased Interest 0 0 0  Down, Depressed, Hopeless 0 0 0  PHQ - 2 Score 0 0 0    Allergies  Allergen Reactions  . Amoxicillin Hives    Prior to Admission medications   Not on File    Past Medical History:  Diagnosis Date  . Allergy   . Anemia   . Contact dermatitis   . Medical history non-contributory   . Prediabetes     Past Surgical History:  Procedure Laterality Date  . FOOT SURGERY Right   . TUBAL LIGATION Bilateral 12/04/2013   Procedure: POST PARTUM TUBAL LIGATION;  Surgeon: Kirkland HunArthur Stringer, MD;  Location: [facility];  Service: Gynecology;  Laterality: Bilateral;  . WISDOM TOOTH EXTRACTION      Social History   Tobacco Use  . Smoking status: Never Smoker  . Smokeless tobacco: Never Used  Substance Use Topics  . Alcohol use: Yes    Comment: cocktail every few months    Family History  Problem Relation Age of Onset  . Hypertension Mother   . COPD Maternal Grandfather   . Diabetes Maternal Grandfather   . Epilepsy Paternal Grandmother   . Breast cancer Maternal Aunt   . Prostate cancer Maternal Uncle     Review of Systems  Constitutional: Negative for chills and fever.  Respiratory: Negative for cough and shortness of breath.   Cardiovascular: Negative for chest pain, palpitations and leg swelling.  Gastrointestinal: Negative for abdominal  pain, nausea and vomiting.     OBJECTIVE:  Blood pressure 111/76, pulse 75, temperature 98.4 F (36.9 C), temperature source Oral, resp. rate 17, height 5' 7.5" (1.715 m), weight 214 lb (97.1 kg), last menstrual period 12/23/2017, SpO2 98 %, unknown if currently breastfeeding. Body mass index is 33.02 kg/m.   Wt Readings from Last 3 Encounters:  01/26/18 214 lb (97.1 kg)  02/20/17 215 lb (97.5 kg)  11/28/16 212 lb (96.2 kg)    Physical Exam  Constitutional: She is oriented to person, place, and time. She appears well-developed and well-nourished.  HENT:  Head: Normocephalic and atraumatic.  Mouth/Throat: Oropharynx is clear and moist. No oropharyngeal exudate.  Eyes: Pupils are equal, round, and reactive to light. EOM are normal. No scleral icterus.  Neck: Neck supple.  Cardiovascular: Normal rate, regular rhythm and normal heart sounds. Exam reveals no gallop and no friction rub.  No murmur heard. Pulmonary/Chest: Effort normal and breath sounds normal. She has no wheezes. She has no rales.  Musculoskeletal: She exhibits no edema.  Neurological: She is alert and oriented to person, place, and time.  Skin: Skin is warm and dry.  Psychiatric: She has a normal mood and affect.  Nursing note and vitals reviewed.    ASSESSMENT and PLAN  1. Prediabetes - Hemoglobin A1c 2. Class 1 obesity due to excess calories without serious comorbidity  with body mass index (BMI) of 33.0 to 33.9 in adult 3. Family history of diabetes mellitus type II  Discussed low carb diet, regular exercise and healthy weight. Consider starting metformin. Contact info for nutritionist given.   Return in about 6 months (around 07/29/2018).    Myles Lipps, MD Primary Care at [facility] ([place]) [address] [place], [place] [address] Ph.  [phone] Fax [phone]

## 2018-01-26 NOTE — Patient Instructions (Addendum)
  [employer]  No reviews  Nutritionist  [place]  [phone]    If you have lab work done today you will be contacted with your lab results within the next 2 weeks.  If you have not heard from us then please contact us. The fastest way to get your results is to register for My Chart.   IF you received an x-ray today, you will receive an invoice from [facility]. Please contact [facility] at [phone] with questions or concerns regarding your invoice.   IF you received labwork today, you will receive an invoice from [facility]. Please contact [facility] at [phone] with questions or concerns regarding your invoice.   Our billing staff will not be able to assist you with questions regarding bills from these companies.  You will be contacted with the lab results as soon as they are available. The fastest way to get your results is to activate your My Chart account. Instructions are located on the last page of this paperwork. If you have not heard from us regarding the results in 2 weeks, please contact this office.

## 2018-02-19 ENCOUNTER — Telehealth: Payer: Self-pay | Admitting: Family Medicine

## 2018-02-19 NOTE — Telephone Encounter (Signed)
Left a VM in regards to her appt she has with Dr. Leretha PolSantiago on 03/09/2018. The provider is not going to be in the office on that day and would need to be rescheduled.

## 2018-03-09 ENCOUNTER — Encounter: Payer: Managed Care, Other (non HMO) | Admitting: Family Medicine

## 2018-03-16 ENCOUNTER — Encounter: Payer: Self-pay | Admitting: Family Medicine

## 2018-03-16 ENCOUNTER — Ambulatory Visit (INDEPENDENT_AMBULATORY_CARE_PROVIDER_SITE_OTHER): Payer: Managed Care, Other (non HMO) | Admitting: Family Medicine

## 2018-03-16 VITALS — BP 119/80 | HR 73 | Temp 97.6°F | Resp 18 | Ht 67.5 in | Wt 216.8 lb

## 2018-03-16 DIAGNOSIS — Z13220 Encounter for screening for lipoid disorders: Secondary | ICD-10-CM | POA: Diagnosis not present

## 2018-03-16 DIAGNOSIS — Z0000 Encounter for general adult medical examination without abnormal findings: Secondary | ICD-10-CM | POA: Diagnosis not present

## 2018-03-16 DIAGNOSIS — Z6833 Body mass index (BMI) 33.0-33.9, adult: Secondary | ICD-10-CM

## 2018-03-16 DIAGNOSIS — Z1329 Encounter for screening for other suspected endocrine disorder: Secondary | ICD-10-CM | POA: Diagnosis not present

## 2018-03-16 DIAGNOSIS — Z130 Encounter for screening for diseases of the blood and blood-forming organs and certain disorders involving the immune mechanism: Secondary | ICD-10-CM | POA: Diagnosis not present

## 2018-03-16 DIAGNOSIS — E6609 Other obesity due to excess calories: Secondary | ICD-10-CM

## 2018-03-16 DIAGNOSIS — R7303 Prediabetes: Secondary | ICD-10-CM

## 2018-03-16 NOTE — Patient Instructions (Addendum)
Health Maintenance, Female Adopting a healthy lifestyle and getting preventive care can go a long way to promote health and wellness. Talk with your health care provider about what schedule of regular examinations is right for you. This is a good chance for you to check in with your provider about disease prevention and staying healthy. In between checkups, there are plenty of things you can do on your own. Experts have done a lot of research about which lifestyle changes and preventive measures are most likely to keep you healthy. Ask your health care provider for more information. Weight and diet Eat a healthy diet  Be sure to include plenty of vegetables, fruits, low-fat dairy products, and lean protein.  Do not eat a lot of foods high in solid fats, added sugars, or salt.  Get regular exercise. This is one of the most important things you can do for your health. ? Most adults should exercise for at least 150 minutes each week. The exercise should increase your heart rate and make you sweat (moderate-intensity exercise). ? Most adults should also do strengthening exercises at least twice a week. This is in addition to the moderate-intensity exercise.  Maintain a healthy weight  Body mass index (BMI) is a measurement that can be used to identify possible weight problems. It estimates body fat based on height and weight. Your health care provider can help determine your BMI and help you achieve or maintain a healthy weight.  For females 46 years of age and older: ? A BMI below 18.5 is considered underweight. ? A BMI of 18.5 to 24.9 is normal. ? A BMI of 25 to 29.9 is considered overweight. ? A BMI of 30 and above is considered obese.  Watch levels of cholesterol and blood lipids  You should start having your blood tested for lipids and cholesterol at 33 years of age, then have this test every 5 years.  You may need to have your cholesterol levels checked more often if: ? Your lipid  or cholesterol levels are high. ? You are older than 33 years of age. ? You are at high risk for heart disease.  Cancer screening Lung Cancer  Lung cancer screening is recommended for adults 79-62 years old who are at high risk for lung cancer because of a history of smoking.  A yearly low-dose CT scan of the lungs is recommended for people who: ? Currently smoke. ? Have quit within the past 15 years. ? Have at least a 30-pack-year history of smoking. A pack year is smoking an average of one pack of cigarettes a day for 1 year.  Yearly screening should continue until it has been 15 years since you quit.  Yearly screening should stop if you develop a health problem that would prevent you from having lung cancer treatment.  Breast Cancer  Practice breast self-awareness. This means understanding how your breasts normally appear and feel.  It also means doing regular breast self-exams. Let your health care provider know about any changes, no matter how small.  If you are in your 20s or 30s, you should have a clinical breast exam (CBE) by a health care provider every 1-3 years as part of a regular health exam.  If you are 59 or older, have a CBE every year. Also consider having a breast X-ray (mammogram) every year.  If you have a family history of breast cancer, talk to your health care provider about genetic screening.  If you are at high  risk for breast cancer, talk to your health care provider about having an MRI and a mammogram every year.  Breast cancer gene (BRCA) assessment is recommended for women who have family members with BRCA-related cancers. BRCA-related cancers include: ? Breast. ? Ovarian. ? Tubal. ? Peritoneal cancers.  Results of the assessment will determine the need for genetic counseling and BRCA1 and BRCA2 testing.  Cervical Cancer Your health care provider may recommend that you be screened regularly for cancer of the pelvic organs (ovaries, uterus, and  vagina). This screening involves a pelvic examination, including checking for microscopic changes to the surface of your cervix (Pap test). You may be encouraged to have this screening done every 3 years, beginning at age 21.  For women ages 30-65, health care providers may recommend pelvic exams and Pap testing every 3 years, or they may recommend the Pap and pelvic exam, combined with testing for human papilloma virus (HPV), every 5 years. Some types of HPV increase your risk of cervical cancer. Testing for HPV may also be done on women of any age with unclear Pap test results.  Other health care providers may not recommend any screening for nonpregnant women who are considered low risk for pelvic cancer and who do not have symptoms. Ask your health care provider if a screening pelvic exam is right for you.  If you have had past treatment for cervical cancer or a condition that could lead to cancer, you need Pap tests and screening for cancer for at least 20 years after your treatment. If Pap tests have been discontinued, your risk factors (such as having a new sexual partner) need to be reassessed to determine if screening should resume. Some women have medical problems that increase the chance of getting cervical cancer. In these cases, your health care provider may recommend more frequent screening and Pap tests.  Colorectal Cancer  This type of cancer can be detected and often prevented.  Routine colorectal cancer screening usually begins at 33 years of age and continues through 33 years of age.  Your health care provider may recommend screening at an earlier age if you have risk factors for colon cancer.  Your health care provider may also recommend using home test kits to check for hidden blood in the stool.  A small camera at the end of a tube can be used to examine your colon directly (sigmoidoscopy or colonoscopy). This is done to check for the earliest forms of colorectal  cancer.  Routine screening usually begins at age 50.  Direct examination of the colon should be repeated every 5-10 years through 33 years of age. However, you may need to be screened more often if early forms of precancerous polyps or small growths are found.  Skin Cancer  Check your skin from head to toe regularly.  Tell your health care provider about any new moles or changes in moles, especially if there is a change in a mole's shape or color.  Also tell your health care provider if you have a mole that is larger than the size of a pencil eraser.  Always use sunscreen. Apply sunscreen liberally and repeatedly throughout the day.  Protect yourself by wearing long sleeves, pants, a wide-brimmed hat, and sunglasses whenever you are outside.  Heart disease, diabetes, and high blood pressure  High blood pressure causes heart disease and increases the risk of stroke. High blood pressure is more likely to develop in: ? People who have blood pressure in the high end   of the normal range (130-139/85-89 mm Hg). ? People who are overweight or obese. ? People who are African American.  If you are 18-39 years of age, have your blood pressure checked every 3-5 years. If you are 40 years of age or older, have your blood pressure checked every year. You should have your blood pressure measured twice-once when you are at a hospital or clinic, and once when you are not at a hospital or clinic. Record the average of the two measurements. To check your blood pressure when you are not at a hospital or clinic, you can use: ? An automated blood pressure machine at a pharmacy. ? A home blood pressure monitor.  If you are between 55 years and 79 years old, ask your health care provider if you should take aspirin to prevent strokes.  Have regular diabetes screenings. This involves taking a blood sample to check your fasting blood sugar level. ? If you are at a normal weight and have a low risk for diabetes,  have this test once every three years after 33 years of age. ? If you are overweight and have a high risk for diabetes, consider being tested at a younger age or more often. Preventing infection Hepatitis B  If you have a higher risk for hepatitis B, you should be screened for this virus. You are considered at high risk for hepatitis B if: ? You were born in a country where hepatitis B is common. Ask your health care provider which countries are considered high risk. ? Your parents were born in a high-risk country, and you have not been immunized against hepatitis B (hepatitis B vaccine). ? You have HIV or AIDS. ? You use needles to inject street drugs. ? You live with someone who has hepatitis B. ? You have had sex with someone who has hepatitis B. ? You get hemodialysis treatment. ? You take certain medicines for conditions, including cancer, organ transplantation, and autoimmune conditions.  Hepatitis C  Blood testing is recommended for: ? Everyone born from 1945 through 1965. ? Anyone with known risk factors for hepatitis C.  Sexually transmitted infections (STIs)  You should be screened for sexually transmitted infections (STIs) including gonorrhea and chlamydia if: ? You are sexually active and are younger than 33 years of age. ? You are older than 33 years of age and your health care provider tells you that you are at risk for this type of infection. ? Your sexual activity has changed since you were last screened and you are at an increased risk for chlamydia or gonorrhea. Ask your health care provider if you are at risk.  If you do not have HIV, but are at risk, it may be recommended that you take a prescription medicine daily to prevent HIV infection. This is called pre-exposure prophylaxis (PrEP). You are considered at risk if: ? You are sexually active and do not regularly use condoms or know the HIV status of your partner(s). ? You take drugs by injection. ? You are  sexually active with a partner who has HIV.  Talk with your health care provider about whether you are at high risk of being infected with HIV. If you choose to begin PrEP, you should first be tested for HIV. You should then be tested every 3 months for as long as you are taking PrEP. Pregnancy  If you are premenopausal and you may become pregnant, ask your health care provider about preconception counseling.  If you may   become pregnant, take 400 to 800 micrograms (mcg) of folic acid every day.  If you want to prevent pregnancy, talk to your health care provider about birth control (contraception). Osteoporosis and menopause  Osteoporosis is a disease in which the bones lose minerals and strength with aging. This can result in serious bone fractures. Your risk for osteoporosis can be identified using a bone density scan.  If you are 70 years of age or older, or if you are at risk for osteoporosis and fractures, ask your health care provider if you should be screened.  Ask your health care provider whether you should take a calcium or vitamin D supplement to lower your risk for osteoporosis.  Menopause may have certain physical symptoms and risks.  Hormone replacement therapy may reduce some of these symptoms and risks. Talk to your health care provider about whether hormone replacement therapy is right for you. Follow these instructions at home:  Schedule regular health, dental, and eye exams.  Stay current with your immunizations.  Do not use any tobacco products including cigarettes, chewing tobacco, or electronic cigarettes.  If you are pregnant, do not drink alcohol.  If you are breastfeeding, limit how much and how often you drink alcohol.  Limit alcohol intake to no more than 1 drink per day for nonpregnant women. One drink equals 12 ounces of beer, 5 ounces of wine, or 1 ounces of hard liquor.  Do not use street drugs.  Do not share needles.  Ask your health care  provider for help if you need support or information about quitting drugs.  Tell your health care provider if you often feel depressed.  Tell your health care provider if you have ever been abused or do not feel safe at home. This information is not intended to replace advice given to you by your health care provider. Make sure you discuss any questions you have with your health care provider. Document Released: 12/06/2010 Document Revised: 10/29/2015 Document Reviewed: 02/24/2015 Elsevier Interactive Patient Education  [employer].   If you have lab work done today you will be contacted with your lab results within the next 2 weeks.  If you have not heard from [place] then please contact us. The fastest way to get your results is to register for My Chart.   IF you received an x-ray today, you will receive an invoice from [facility]. Please contact [facility] at [phone] with questions or concerns regarding your invoice.   IF you received labwork today, you will receive an invoice from [facility]. Please contact [facility] at [phone] with questions or concerns regarding your invoice.   Our billing staff will not be able to assist you with questions regarding bills from these companies.  You will be contacted with the lab results as soon as they are available. The fastest way to get your results is to activate your My Chart account. Instructions are located on the last page of this paperwork. If you have not heard from [place] regarding the results in 2 weeks, please contact this office.     Prediabetes Eating Plan Prediabetes-also called impaired glucose tolerance or impaired fasting glucose-is a condition that causes blood sugar (blood glucose) levels to be higher than normal. Following a healthy diet can help to keep prediabetes under control. It can also help to lower the risk of type 2 diabetes and heart disease, which are increased in people who have prediabetes.  Along with regular exercise, a healthy diet:  Promotes weight  loss.  Helps to control blood sugar levels.  Helps to improve the way that the body uses insulin.  What do I need to know about this eating plan?  Use the glycemic index (GI) to plan your meals. The index tells you how quickly a food will raise your blood sugar. Choose low-GI foods. These foods take a longer time to raise blood sugar.  Pay close attention to the amount of carbohydrates in the food that you eat. Carbohydrates increase blood sugar levels.  Keep track of how many calories you take in. Eating the right amount of calories will help you to achieve a healthy weight. Losing about 7 percent of your starting weight can help to prevent type 2 diabetes.  You may want to follow a Mediterranean diet. This diet includes a lot of vegetables, lean meats or fish, whole grains, fruits, and healthy oils and fats. What foods can I eat? Grains Whole grains, such as whole-wheat or whole-grain breads, crackers, cereals, and pasta. Unsweetened oatmeal. Bulgur. Barley. Quinoa. Brown rice. Corn or whole-wheat flour tortillas or taco shells. Vegetables Lettuce. Spinach. Peas. Beets. Cauliflower. Cabbage. Broccoli. Carrots. Tomatoes. Squash. Eggplant. Herbs. Peppers. Onions. Cucumbers. Brussels sprouts. Fruits Berries. Bananas. Apples. Oranges. Grapes. Papaya. Mango. Pomegranate. Kiwi. Grapefruit. Cherries. Meats and Other Protein Sources Seafood. Lean meats, such as chicken and [place] or lean cuts of pork and beef. Tofu. Eggs. Nuts. Beans. Dairy Low-fat or fat-free dairy products, such as yogurt, cottage cheese, and cheese. Beverages Water. Tea. Coffee. Sugar-free or diet soda. Seltzer water. Milk. Milk alternatives, such as soy or almond milk. Condiments Mustard. Relish. Low-fat, low-sugar ketchup. Low-fat, low-sugar barbecue sauce. Low-fat or fat-free mayonnaise. Sweets and Desserts Sugar-free or low-fat pudding. Sugar-free or  low-fat ice cream and other frozen treats. Fats and Oils Avocado. Walnuts. Olive oil. The items listed above may not be a complete list of recommended foods or beverages. Contact your dietitian for more options. What foods are not recommended? Grains Refined white flour and flour products, such as bread, pasta, snack foods, and cereals. Beverages Sweetened drinks, such as sweet iced tea and soda. Sweets and Desserts Baked goods, such as cake, cupcakes, pastries, cookies, and cheesecake. The items listed above may not be a complete list of foods and beverages to avoid. Contact your dietitian for more information. This information is not intended to replace advice given to you by your health care provider. Make sure you discuss any questions you have with your health care provider. Document Released: 10/07/2014 Document Revised: 10/29/2015 Document Reviewed: 06/18/2014 Elsevier Interactive Patient Education  2017 [place] 18-39 Years, Female Preventive care refers to lifestyle choices and visits with your health care provider that can promote health and wellness. What does preventive care include?  A yearly physical exam. This is also called an annual well check.  Dental exams once or twice a year.  Routine eye exams. Ask your health care provider how often you should have your eyes checked.  Personal lifestyle choices, including: ? Daily care of your teeth and gums. ? Regular physical activity. ? Eating a healthy diet. ? Avoiding tobacco and drug use. ? Limiting alcohol use. ? Practicing safe sex. ? Taking vitamin and mineral supplements as recommended by your health care provider. What happens during an annual well check? The services and screenings done by your health care provider during your annual well check will depend on your age, overall health, lifestyle risk factors, and family history of disease. Counseling Your health care  provider may ask you  questions about your:  Alcohol use.  Tobacco use.  Drug use.  Emotional well-being.  Home and relationship well-being.  Sexual activity.  Eating habits.  Work and work Statistician.  Method of birth control.  Menstrual cycle.  Pregnancy history.  Screening You may have the following tests or measurements:  Height, weight, and BMI.  Diabetes screening. This is done by checking your blood sugar (glucose) after you have not eaten for a while (fasting).  Blood pressure.  Lipid and cholesterol levels. These may be checked every 5 years starting at age 80.  Skin check.  Hepatitis C blood test.  Hepatitis B blood test.  Sexually transmitted disease (STD) testing.  BRCA-related cancer screening. This may be done if you have a family history of breast, ovarian, tubal, or peritoneal cancers.  Pelvic exam and Pap test. This may be done every 3 years starting at age 39. Starting at age 64, this may be done every 5 years if you have a Pap test in combination with an HPV test.  Discuss your test results, treatment options, and if necessary, the need for more tests with your health care provider. Vaccines Your health care provider may recommend certain vaccines, such as:  Influenza vaccine. This is recommended every year.  Tetanus, diphtheria, and acellular pertussis (Tdap, Td) vaccine. You may need a Td booster every 10 years.  Varicella vaccine. You may need this if you have not been vaccinated.  HPV vaccine. If you are 65 or younger, you may need three doses over 6 months.  Measles, mumps, and rubella (MMR) vaccine. You may need at least one dose of MMR. You may also need a second dose.  Pneumococcal 13-valent conjugate (PCV13) vaccine. You may need this if you have certain conditions and were not previously vaccinated.  Pneumococcal polysaccharide (PPSV23) vaccine. You may need one or two doses if you smoke cigarettes or if you have certain  conditions.  Meningococcal vaccine. One dose is recommended if you are age 70-21 years and a first-year college student living in a residence hall, or if you have one of several medical conditions. You may also need additional booster doses.  Hepatitis A vaccine. You may need this if you have certain conditions or if you travel or work in places where you may be exposed to hepatitis A.  Hepatitis B vaccine. You may need this if you have certain conditions or if you travel or work in places where you may be exposed to hepatitis B.  Haemophilus influenzae type b (Hib) vaccine. You may need this if you have certain risk factors.  Talk to your health care provider about which screenings and vaccines you need and how often you need them. This information is not intended to replace advice given to you by your health care provider. Make sure you discuss any questions you have with your health care provider. Document Released: 07/19/2001 Document Revised: 02/10/2016 Document Reviewed: 03/24/2015 Elsevier Interactive Patient Education  [employer].

## 2018-03-16 NOTE — Progress Notes (Signed)
10/11/201910:34 AM  Casey Hamilton 1986/05/21, 32 y.o. female [id number]  Chief Complaint  Patient presents with  . Annual Exam    no pap    HPI:   Patient is a 32 y.o. female with past medical history significant for prediabetes who presents today for CPE  Cervical Cancer Screening: 02/2017, normal, repeat in 3 years G3P3, s/p BTL HIV Screening: declines Seasonal Influenza Vaccination: declines, gets it at work Td/Tdap Vaccination: 2015 Frequency of Dental evaluation: Q6 months Frequency of Eye evaluation: yearly  Both side aunts with breast cancer, postmenopausal Paternal uncle with colon cancer, in his 83s  Fall Risk  03/16/2018 01/26/2018 02/20/2017 11/28/2016  Falls in the past year? No No No No     Depression screen [facility] 2/9 03/16/2018 01/26/2018 02/20/2017  Decreased Interest 0 0 0  Down, Depressed, Hopeless 0 0 0  PHQ - 2 Score 0 0 0    Allergies  Allergen Reactions  . Amoxicillin Hives    Prior to Admission medications   Not on File    Past Medical History:  Diagnosis Date  . Allergy   . Anemia   . Contact dermatitis   . Medical history non-contributory   . Prediabetes     Past Surgical History:  Procedure Laterality Date  . FOOT SURGERY Right   . TUBAL LIGATION Bilateral 12/04/2013   Procedure: POST PARTUM TUBAL LIGATION;  Surgeon: Kirkland Hun, MD;  Location: [facility];  Service: Gynecology;  Laterality: Bilateral;  . WISDOM TOOTH EXTRACTION      Social History   Tobacco Use  . Smoking status: Never Smoker  . Smokeless tobacco: Never Used  Substance Use Topics  . Alcohol use: Yes    Comment: cocktail every few months    Family History  Problem Relation Age of Onset  . Hypertension Mother   . COPD Maternal Grandfather   . Diabetes Maternal Grandfather   . Epilepsy Paternal Grandmother   . Breast cancer Maternal Aunt   . Prostate cancer Maternal Uncle     Review of Systems  Constitutional: Negative for chills and fever.    Respiratory: Negative for cough and shortness of breath.   Cardiovascular: Negative for chest pain, palpitations and leg swelling.  Gastrointestinal: Negative for abdominal pain, nausea and vomiting.  Genitourinary: Negative for dysuria and hematuria.  Musculoskeletal: Negative for falls, joint pain and myalgias.  Neurological: Positive for tingling. Negative for dizziness and headaches.  Psychiatric/Behavioral: Negative for depression. The patient is not nervous/anxious and does not have insomnia.   All other systems reviewed and are negative.   OBJECTIVE:  Blood pressure 119/80, pulse 73, temperature 97.6 F (36.4 C), temperature source Oral, resp. rate 18, height 5' 7.5" (1.715 m), weight 216 lb 12.8 oz (98.3 kg), SpO2 96 %, unknown if currently breastfeeding. Body mass index is 33.45 kg/m.   Wt Readings from Last 3 Encounters:  03/16/18 216 lb 12.8 oz (98.3 kg)  01/26/18 214 lb (97.1 kg)  02/20/17 215 lb (97.5 kg)    Physical Exam  Constitutional: She is oriented to person, place, and time. She appears well-developed and well-nourished.  HENT:  Head: Normocephalic and atraumatic.  Right Ear: Hearing, tympanic membrane, external ear and ear canal normal.  Left Ear: Hearing, tympanic membrane, external ear and ear canal normal.  Mouth/Throat: Oropharynx is clear and moist.  Eyes: Pupils are equal, round, and reactive to light. Conjunctivae and EOM are normal.  Neck: Neck supple. No thyromegaly present.  Cardiovascular: Normal rate, regular  rhythm, normal heart sounds and intact distal pulses. Exam reveals no gallop and no friction rub.  No murmur heard. Pulmonary/Chest: Effort normal and breath sounds normal. She has no wheezes. She has no rhonchi. She has no rales. Right breast exhibits no inverted nipple, no mass, no nipple discharge, no skin change and no tenderness. Left breast exhibits no inverted nipple, no mass, no nipple discharge, no skin change and no tenderness.   Abdominal: Soft. Bowel sounds are normal. She exhibits no distension and no mass. There is no tenderness.  Musculoskeletal: Normal range of motion. She exhibits no edema.  Lymphadenopathy:    She has no cervical adenopathy.    She has no axillary adenopathy.       Right: No supraclavicular adenopathy present.       Left: No supraclavicular adenopathy present.  Neurological: She is alert and oriented to person, place, and time. She has normal reflexes. No cranial nerve deficit. Gait normal.  Skin: Skin is warm and dry.  Psychiatric: She has a normal mood and affect.  Nursing note and vitals reviewed.   ASSESSMENT and PLAN  1. Annual physical exam Routine HCM labs ordered. HCM reviewed/discussed. Anticipatory guidance regarding healthy weight, lifestyle and choices given.   - TSH - Lipid panel - Comprehensive metabolic panel - CBC with Differential/Platelet  2. Prediabetes - TSH - Lipid panel - Comprehensive metabolic panel  3. Class 1 obesity due to excess calories without serious comorbidity with body mass index (BMI) of 33.0 to 33.9 in adult - TSH - Lipid panel - Comprehensive metabolic panel - CBC with Differential/Platelet  4. Screening for lipid disorders - Lipid panel  5. Screening for thyroid disorder - TSH  6. Screening for deficiency anemia - CBC with Differential/Platelet    Return for already scheduled.    Myles Lipps, MD Primary Care at [facility] [address] [place], [place] [address] Ph.  [phone] Fax [phone]

## 2018-03-17 LAB — COMPREHENSIVE METABOLIC PANEL
ALT: 16 IU/L (ref 0–32)
AST: 18 IU/L (ref 0–40)
Albumin/Globulin Ratio: 1.3 (ref 1.2–2.2)
Albumin: 4.3 g/dL (ref 3.5–5.5)
Alkaline Phosphatase: 82 IU/L (ref 39–117)
BUN/Creatinine Ratio: 12 (ref 9–23)
BUN: 8 mg/dL (ref 6–20)
Bilirubin Total: 0.6 mg/dL (ref 0.0–1.2)
CO2: 24 mmol/L (ref 20–29)
Calcium: 9.3 mg/dL (ref 8.7–10.2)
Chloride: 102 mmol/L (ref 96–106)
Creatinine, Ser: 0.68 mg/dL (ref 0.57–1.00)
GFR calc Af Amer: 133 mL/min/{1.73_m2} (ref >59)
GFR calc non Af Amer: 115 mL/min/{1.73_m2} (ref >59)
Globulin, Total: 3.2 g/dL (ref 1.5–4.5)
Glucose: 92 mg/dL (ref 65–99)
Potassium: 4.1 mmol/L (ref 3.5–5.2)
Sodium: 139 mmol/L (ref 134–144)
Total Protein: 7.5 g/dL (ref 6.0–8.5)

## 2018-03-17 LAB — CBC WITH DIFFERENTIAL/PLATELET
Basophils Absolute: 0 10*3/uL (ref 0.0–0.2)
Basos: 1 %
EOS (ABSOLUTE): 0.1 10*3/uL (ref 0.0–0.4)
Eos: 2 %
Hematocrit: 38.8 % (ref 34.0–46.6)
Hemoglobin: 12.5 g/dL (ref 11.1–15.9)
Immature Grans (Abs): 0 10*3/uL (ref 0.0–0.1)
Immature Granulocytes: 0 %
Lymphocytes Absolute: 2.3 10*3/uL (ref 0.7–3.1)
Lymphs: 33 %
MCH: 26.5 pg — ABNORMAL LOW (ref 26.6–33.0)
MCHC: 32.2 g/dL (ref 31.5–35.7)
MCV: 82 fL (ref 79–97)
Monocytes Absolute: 0.3 10*3/uL (ref 0.1–0.9)
Monocytes: 5 %
Neutrophils Absolute: 4.1 10*3/uL (ref 1.4–7.0)
Neutrophils: 59 %
Platelets: 302 10*3/uL (ref 150–450)
RBC: 4.72 x10E6/uL (ref 3.77–5.28)
RDW: 13.7 % (ref 12.3–15.4)
WBC: 6.9 10*3/uL (ref 3.4–10.8)

## 2018-03-17 LAB — LIPID PANEL
Chol/HDL Ratio: 2.9 ratio (ref 0.0–4.4)
Cholesterol, Total: 149 mg/dL (ref 100–199)
HDL: 51 mg/dL (ref >39)
LDL Calculated: 85 mg/dL (ref 0–99)
Triglycerides: 65 mg/dL (ref 0–149)
VLDL Cholesterol Cal: 13 mg/dL (ref 5–40)

## 2018-03-17 LAB — TSH: TSH: 0.653 u[IU]/mL (ref 0.450–4.500)

## 2018-05-16 ENCOUNTER — Ambulatory Visit (HOSPITAL_COMMUNITY)
Admission: EM | Admit: 2018-05-16 | Discharge: 2018-05-16 | Disposition: A | Discharge status: Home / Self Care | Payer: Worker's Compensation | Attending: Family Medicine | Admitting: Family Medicine

## 2018-05-16 ENCOUNTER — Encounter (HOSPITAL_COMMUNITY): Payer: Self-pay | Admitting: Emergency Medicine

## 2018-05-16 DIAGNOSIS — Y9269 Other specified industrial and construction area as the place of occurrence of the external cause: Secondary | ICD-10-CM

## 2018-05-16 DIAGNOSIS — M545 Low back pain, unspecified: Secondary | ICD-10-CM

## 2018-05-16 DIAGNOSIS — X501XXA Overexertion from prolonged static or awkward postures, initial encounter: Secondary | ICD-10-CM

## 2018-05-16 MED ORDER — MELOXICAM 7.5 MG PO TABS: 7.5000 mg | ORAL_TABLET | Freq: Every day | ORAL | 0 refills | Status: DC

## 2018-05-16 MED ORDER — METHOCARBAMOL 500 MG PO TABS: 500.0000 mg | ORAL_TABLET | Freq: Two times a day (BID) | ORAL | 0 refills | Status: DC

## 2018-05-16 NOTE — Discharge Instructions (Signed)
Start Mobic. Do not take ibuprofen (motrin/advil)/ naproxen (aleve) while on mobic. Robaxin as needed, this can make you drowsy, so do not take if you are going to drive, operate heavy machinery, or make important decisions. Ice/heat compresses as needed. Follow up with occupational health tomorrow for reevaluation and paperwork needed. If experience numbness/tingling of the inner thighs, loss of bladder or bowel control, go to the emergency department for evaluation.

## 2018-05-16 NOTE — ED Triage Notes (Signed)
[facility] for assessment of left sided back, buttocks and leg pain since she twisted making a bed at work and injured herself.  Pt c/o limp with ambulation.  Denies loss of bowel or bladder.

## 2018-05-16 NOTE — ED Provider Notes (Signed)
[facility]    CSN: [id number] Arrival date & time: 05/16/18  1347     History   Chief Complaint Chief Complaint  Patient presents with  . Back Pain    HPI [name] is a 32 y.o. female.   32 year old female comes in for evaluation after injury at work today. Works in assisted living. She states she was twisted making a bed at work when she felt a pain to the left back that radiates to the buttock and leg. She is limping with ambulation due to the pain. Denies numbness/tingling, saddle anesthesia, loss of bladder or bowel control. She does not have pain at rest. Pain mostly with ROM and ambulation. Has not taken anything for the symptoms.      Past Medical History:  Diagnosis Date  . Allergy   . Anemia   . Contact dermatitis   . Medical history non-contributory   . Prediabetes     Patient Active Problem List   Diagnosis Date Noted  . Prediabetes 02/20/2017  . Atopic dermatitis 02/01/2016  . Other seasonal allergic rhinitis 02/01/2016  . Menorrhagia with regular cycle 02/01/2016  . S/P tubal ligation 12/05/2013  . Anemia 12/05/2013  . Rectal bleeding - consult to GI; neg eval 12/03/2013    Past Surgical History:  Procedure Laterality Date  . FOOT SURGERY Right   . TUBAL LIGATION Bilateral 12/04/2013   Procedure: POST PARTUM TUBAL LIGATION;  Surgeon: Kirkland Hun, MD;  Location: [facility];  Service: Gynecology;  Laterality: Bilateral;  . WISDOM TOOTH EXTRACTION      OB History    Gravida  3   Para  3   Term  3   Preterm      AB      Living  3     SAB      TAB      Ectopic      Multiple      Live Births  1            Home Medications    Prior to Admission medications   Medication Sig Start Date End Date Taking? Authorizing Provider  meloxicam (MOBIC) 7.5 MG tablet Take 1 tablet (7.5 mg total) by mouth daily. 05/16/18   Cathie Hoops, Eissa Buchberger V, PA-C  methocarbamol (ROBAXIN) 500 MG tablet Take 1 tablet (500 mg total) by mouth 2  (two) times daily. 05/16/18   Belinda Fisher, PA-C    Family History Family History  Problem Relation Age of Onset  . Hypertension Mother   . COPD Maternal Grandfather   . Diabetes Maternal Grandfather   . Epilepsy Paternal Grandmother   . Breast cancer Maternal Aunt   . Prostate cancer Maternal Uncle     Social History Social History   Tobacco Use  . Smoking status: Never Smoker  . Smokeless tobacco: Never Used  Substance Use Topics  . Alcohol use: Yes    Comment: cocktail every few months  . Drug use: No     Allergies   Amoxicillin   Review of Systems Review of Systems  Reason unable to perform ROS: See HPI as above.     Physical Exam Triage Vital Signs ED Triage Vitals  Enc Vitals Group     BP 05/16/18 1521 117/89     Pulse Rate 05/16/18 1521 88     Resp 05/16/18 1521 18     Temp 05/16/18 1521 98.2 F (36.8 C)     Temp Source 05/16/18  1521 Oral     SpO2 05/16/18 1521 100 %     Weight --      Height --      Head Circumference --      Peak Flow --      Pain Score 05/16/18 1552 6     Pain Loc --      Pain Edu? --      Excl. in GC? --    No data found.  Updated Vital Signs BP 117/89 (BP Location: Left Arm)   Pulse 88   Temp 98.2 F (36.8 C) (Oral)   Resp 18   SpO2 100%   Physical Exam  Constitutional: She is oriented to person, place, and time. She appears well-developed and well-nourished. No distress.  HENT:  Head: Normocephalic and atraumatic.  Eyes: Pupils are equal, round, and reactive to light. Conjunctivae are normal.  Cardiovascular: Normal rate, regular rhythm and normal heart sounds. Exam reveals no gallop and no friction rub.  No murmur heard. Pulmonary/Chest: Effort normal and breath sounds normal. No accessory muscle usage or stridor. No respiratory distress. She has no decreased breath sounds. She has no wheezes. She has no rhonchi. She has no rales.  Musculoskeletal:  No swelling, erythema, warmth contusion, rashes. No tenderness on  palpation of the spinal processes. Tenderness to palpation along left lumbar paraspinal muscles. Mild tenderness to palpation diffusely of the left lumber region. Decreased flexion of the back. Full ROM of hips. Strength normal and equal bilaterally. Sensation intact and equal bilaterally. Negative straight leg raise.   Neurological: She is alert and oriented to person, place, and time.  Skin: Skin is warm and dry. She is not diaphoretic.     UC Treatments / Results  Labs (all labs ordered are listed, but only abnormal results are displayed) Labs Reviewed - No data to display  EKG None  Radiology No results found.  Procedures Procedures (including critical care time)  Medications Ordered in UC Medications - No data to display  Initial Impression / Assessment and Plan / UC Course  I have reviewed the triage vital signs and the nursing notes.  Pertinent labs & imaging results that were available during my care of the patient were reviewed by me and considered in my medical decision making (see chart for details).    Start NSAID as directed for pain and inflammation. Muscle relaxant as needed. Ice/heat compresses. Discussed with patient strain can take up to 3-4 weeks to resolve, but should be getting better each week. Patient to follow up with occupational health for further evaluation and management needed. Return precautions given.   Final Clinical Impressions(s) / UC Diagnoses   Final diagnoses:  Acute left-sided low back pain without sciatica    ED Prescriptions    Medication Sig Dispense Auth. Provider   meloxicam (MOBIC) 7.5 MG tablet Take 1 tablet (7.5 mg total) by mouth daily. 15 tablet Cambell Rickenbach V, PA-C   methocarbamol (ROBAXIN) 500 MG tablet Take 1 tablet (500 mg total) by mouth 2 (two) times daily. 20 tablet Threasa AlphaYu, Kensey Luepke V, PA-C        Tawyna Pellot V, PA-C 05/16/18 1622

## 2018-07-11 ENCOUNTER — Ambulatory Visit (HOSPITAL_COMMUNITY)
Admission: EM | Admit: 2018-07-11 | Discharge: 2018-07-11 | Disposition: A | Discharge status: Home / Self Care | Payer: 59 | Attending: Emergency Medicine | Admitting: Emergency Medicine

## 2018-07-11 ENCOUNTER — Encounter (HOSPITAL_COMMUNITY): Payer: Self-pay

## 2018-07-11 DIAGNOSIS — R6889 Other general symptoms and signs: Secondary | ICD-10-CM | POA: Diagnosis not present

## 2018-07-11 LAB — POCT RAPID STREP A: Streptococcus, Group A Screen (Direct): NEGATIVE

## 2018-07-11 MED ORDER — LIDOCAINE VISCOUS HCL 2 % MT SOLN: OROMUCOSAL | 0 refills | Status: DC

## 2018-07-11 MED ORDER — BENZONATATE 100 MG PO CAPS: 100.0000 mg | ORAL_CAPSULE | Freq: Three times a day (TID) | ORAL | 0 refills | Status: DC

## 2018-07-11 MED ORDER — IPRATROPIUM BROMIDE 0.06 % NA SOLN: 2.0000 | Freq: Four times a day (QID) | NASAL | 0 refills | Status: DC

## 2018-07-11 NOTE — ED Triage Notes (Signed)
Pt present fever, body aches and chills. Symptoms started on Monday.

## 2018-07-11 NOTE — ED Provider Notes (Signed)
[facility]    CSN: [id number] Arrival date & time: 07/11/18  1550     History   Chief Complaint Chief Complaint  Patient presents with  . Fever  . Generalized Body Aches    HPI Casey Hamilton is a 33 y.o. female.   33 year old female comes in for 3 day history of URI symptoms. Has had sore throat, fever, body aches, chills. Has also had productive cough. Denies rhinorrhea, nasal congestion. Painful swallowing without trouble breathing, swelling of the throat, tripoding, drooling, trismus. Has not taken anything for the symptoms. Never smoker. No obvious sick contact.      Past Medical History:  Diagnosis Date  . Allergy   . Anemia   . Contact dermatitis   . Medical history non-contributory   . Prediabetes     Patient Active Problem List   Diagnosis Date Noted  . Prediabetes 02/20/2017  . Atopic dermatitis 02/01/2016  . Other seasonal allergic rhinitis 02/01/2016  . Menorrhagia with regular cycle 02/01/2016  . S/P tubal ligation 12/05/2013  . Anemia 12/05/2013  . Rectal bleeding - consult to GI; neg eval 12/03/2013    Past Surgical History:  Procedure Laterality Date  . FOOT SURGERY Right   . TUBAL LIGATION Bilateral 12/04/2013   Procedure: POST PARTUM TUBAL LIGATION;  Surgeon: Kirkland HunArthur Stringer, MD;  Location: [facility];  Service: Gynecology;  Laterality: Bilateral;  . WISDOM TOOTH EXTRACTION      OB History    Gravida  3   Para  3   Term  3   Preterm      AB      Living  3     SAB      TAB      Ectopic      Multiple      Live Births  1            Home Medications    Prior to Admission medications   Medication Sig Start Date End Date Taking? Authorizing Provider  benzonatate (TESSALON) 100 MG capsule Take 1 capsule (100 mg total) by mouth every 8 (eight) hours. 07/11/18   Cathie HoopsYu, Amy V, PA-C  ipratropium (ATROVENT) 0.06 % nasal spray Place 2 sprays into both nostrils 4 (four) times daily. 07/11/18   Cathie HoopsYu, Amy V, PA-C  lidocaine  (XYLOCAINE) 2 % solution 5-15 mL gurgle as needed 07/11/18   Belinda FisherYu, Amy V, PA-C    Family History Family History  Problem Relation Age of Onset  . Hypertension Mother   . COPD Maternal Grandfather   . Diabetes Maternal Grandfather   . Epilepsy Paternal Grandmother   . Breast cancer Maternal Aunt   . Prostate cancer Maternal Uncle     Social History Social History   Tobacco Use  . Smoking status: Never Smoker  . Smokeless tobacco: Never Used  Substance Use Topics  . Alcohol use: Yes    Comment: cocktail every few months  . Drug use: No     Allergies   Amoxicillin   Review of Systems Review of Systems  Reason unable to perform ROS: See HPI as above.     Physical Exam Triage Vital Signs ED Triage Vitals  Enc Vitals Group     BP 07/11/18 1628 125/76     Pulse Rate 07/11/18 1628 (!) 109     Resp 07/11/18 1628 18     Temp 07/11/18 1628 (!) 101.6 F (38.7 C)     Temp Source 07/11/18 1628  Oral     SpO2 07/11/18 1628 99 %     Weight --      Height --      Head Circumference --      Peak Flow --      Pain Score 07/11/18 1631 6     Pain Loc --      Pain Edu? --      Excl. in GC? --    No data found.  Updated Vital Signs BP 125/76 (BP Location: Right Arm)   Pulse (!) 109   Temp (!) 101.6 F (38.7 C) (Oral)   Resp 18   LMP 06/06/2018   SpO2 99%   Physical Exam Constitutional:      General: She is not in acute distress.    Appearance: She is well-developed. She is not ill-appearing, toxic-appearing or diaphoretic.  HENT:     Head: Normocephalic and atraumatic.     Right Ear: Tympanic membrane, ear canal and external ear normal. Tympanic membrane is not erythematous or bulging.     Left Ear: Tympanic membrane, ear canal and external ear normal. Tympanic membrane is not erythematous or bulging.     Nose: Nose normal.     Right Sinus: No maxillary sinus tenderness or frontal sinus tenderness.     Left Sinus: No maxillary sinus tenderness or frontal sinus  tenderness.     Mouth/Throat:     Mouth: Mucous membranes are moist.     Pharynx: Oropharynx is clear. Uvula midline.  Eyes:     Conjunctiva/sclera: Conjunctivae normal.     Pupils: Pupils are equal, round, and reactive to light.  Neck:     Musculoskeletal: Normal range of motion and neck supple.  Cardiovascular:     Rate and Rhythm: Normal rate and regular rhythm.     Heart sounds: Normal heart sounds. No murmur. No friction rub. No gallop.   Pulmonary:     Effort: Pulmonary effort is normal.     Breath sounds: Normal breath sounds. No decreased breath sounds, wheezing, rhonchi or rales.  Lymphadenopathy:     Cervical: No cervical adenopathy.  Skin:    General: Skin is warm and dry.  Neurological:     Mental Status: She is alert and oriented to person, place, and time.  Psychiatric:        Behavior: Behavior normal.        Judgment: Judgment normal.      UC Treatments / Results  Labs (all labs ordered are listed, but only abnormal results are displayed) Labs Reviewed  CULTURE, GROUP A STREP [facility]  POCT RAPID STREP A    EKG None  Radiology No results found.  Procedures Procedures (including critical care time)  Medications Ordered in UC Medications - No data to display  Initial Impression / Assessment and Plan / UC Course  I have reviewed the triage vital signs and the nursing notes.  Pertinent labs & imaging results that were available during my care of the patient were reviewed by me and considered in my medical decision making (see chart for details).    Rapid strep negative.  Discussed flulike symptoms, outside of treatment range of Tamiflu.  Will start symptomatic treatment.  Push fluids.  Return precautions given.  Patient expresses understanding and agrees to plan.  Final Clinical Impressions(s) / UC Diagnoses   Final diagnoses:  Flu-like symptoms    ED Prescriptions    Medication Sig Dispense Auth. Provider   ipratropium (ATROVENT) 0.06 % nasal  spray Place 2 sprays into both nostrils 4 (four) times daily. 15 mL Yu, Amy V, PA-C   lidocaine (XYLOCAINE) 2 % solution 5-15 mL gurgle as needed 150 mL Yu, Amy V, PA-C   benzonatate (TESSALON) 100 MG capsule Take 1 capsule (100 mg total) by mouth every 8 (eight) hours. 21 capsule Threasa Alpha, [place] 07/11/18 1704

## 2018-07-11 NOTE — Discharge Instructions (Signed)
Rapid strep negative. As discussed, symptoms could be due to the flu. Start lidocaine for sore throat, do not eat or drink for the next 40 mins after use as it can stunt your gag reflex. Tessalon for cough. Start  atrovent nasal spray for nasal congestion/drainage. You can use over the counter nasal saline rinse such as neti pot for nasal congestion. Keep hydrated, your urine should be clear to pale yellow in color. Tylenol/motrin for fever and pain. Monitor for any worsening of symptoms, chest pain, shortness of breath, wheezing, swelling of the throat, follow up for reevaluation.   For sore throat/cough try using a honey-based tea. Use 3 teaspoons of honey with juice squeezed from half lemon. Place shaved pieces of ginger into 1/2-1 cup of water and warm over stove top. Then mix the ingredients and repeat every 4 hours as needed.

## 2018-07-14 LAB — CULTURE, GROUP A STREP (THRC)

## 2018-07-26 ENCOUNTER — Telehealth: Payer: Self-pay | Admitting: Family Medicine

## 2018-07-26 NOTE — Telephone Encounter (Signed)
Called pt left message with Mother to have Casey Hamilton call and reschedule appt FR

## 2018-07-27 ENCOUNTER — Ambulatory Visit: Payer: Managed Care, Other (non HMO) | Admitting: Family Medicine

## 2018-07-31 NOTE — Telephone Encounter (Signed)
LVM for pt to call the office and let us know if she can come to an apopt tomorrow 2/26 at 8:00 AM with Dr. Leretha Pol as a reschedule from the cancelled Friday appt due to weather. If pt calls back, please put in the APPOINTMENT NOTES or if she can't make that appt, please call [place] and ask for Gaston Islam or Brandi.

## 2018-08-01 ENCOUNTER — Encounter: Payer: Self-pay | Admitting: Podiatry

## 2018-08-01 ENCOUNTER — Ambulatory Visit (INDEPENDENT_AMBULATORY_CARE_PROVIDER_SITE_OTHER): Payer: 59

## 2018-08-01 ENCOUNTER — Ambulatory Visit (INDEPENDENT_AMBULATORY_CARE_PROVIDER_SITE_OTHER): Payer: 59 | Admitting: Family Medicine

## 2018-08-01 ENCOUNTER — Encounter: Payer: Self-pay | Admitting: Family Medicine

## 2018-08-01 ENCOUNTER — Other Ambulatory Visit: Payer: Self-pay

## 2018-08-01 ENCOUNTER — Ambulatory Visit (INDEPENDENT_AMBULATORY_CARE_PROVIDER_SITE_OTHER): Payer: 59 | Admitting: Podiatry

## 2018-08-01 VITALS — BP 113/67 | HR 76 | Resp 16

## 2018-08-01 VITALS — BP 123/86 | HR 68 | Temp 98.4°F | Ht 67.5 in | Wt 202.0 lb

## 2018-08-01 DIAGNOSIS — R400 Somnolence: Secondary | ICD-10-CM | POA: Diagnosis not present

## 2018-08-01 DIAGNOSIS — M545 Low back pain, unspecified: Secondary | ICD-10-CM | POA: Insufficient documentation

## 2018-08-01 DIAGNOSIS — Z880 Allergy status to penicillin: Secondary | ICD-10-CM | POA: Insufficient documentation

## 2018-08-01 DIAGNOSIS — R7303 Prediabetes: Secondary | ICD-10-CM

## 2018-08-01 DIAGNOSIS — L309 Dermatitis, unspecified: Secondary | ICD-10-CM

## 2018-08-01 DIAGNOSIS — R0683 Snoring: Secondary | ICD-10-CM

## 2018-08-01 DIAGNOSIS — R202 Paresthesia of skin: Secondary | ICD-10-CM

## 2018-08-01 DIAGNOSIS — M79672 Pain in left foot: Secondary | ICD-10-CM | POA: Diagnosis not present

## 2018-08-01 DIAGNOSIS — G8929 Other chronic pain: Secondary | ICD-10-CM

## 2018-08-01 DIAGNOSIS — B353 Tinea pedis: Secondary | ICD-10-CM

## 2018-08-01 DIAGNOSIS — M722 Plantar fascial fibromatosis: Secondary | ICD-10-CM

## 2018-08-01 DIAGNOSIS — N898 Other specified noninflammatory disorders of vagina: Secondary | ICD-10-CM | POA: Insufficient documentation

## 2018-08-01 DIAGNOSIS — Z3490 Encounter for supervision of normal pregnancy, unspecified, unspecified trimester: Secondary | ICD-10-CM | POA: Insufficient documentation

## 2018-08-01 LAB — CMP14+EGFR
ALT: 8 IU/L (ref 0–32)
AST: 10 IU/L (ref 0–40)
Albumin/Globulin Ratio: 1.5 (ref 1.2–2.2)
Albumin: 4.3 g/dL (ref 3.8–4.8)
Alkaline Phosphatase: 68 IU/L (ref 39–117)
BUN/Creatinine Ratio: 13 (ref 9–23)
BUN: 10 mg/dL (ref 6–20)
Bilirubin Total: 0.7 mg/dL (ref 0.0–1.2)
CO2: 23 mmol/L (ref 20–29)
Calcium: 9.4 mg/dL (ref 8.7–10.2)
Chloride: 104 mmol/L (ref 96–106)
Creatinine, Ser: 0.78 mg/dL (ref 0.57–1.00)
GFR calc Af Amer: 116 mL/min/{1.73_m2} (ref >59)
GFR calc non Af Amer: 100 mL/min/{1.73_m2} (ref >59)
Globulin, Total: 2.8 g/dL (ref 1.5–4.5)
Glucose: 98 mg/dL (ref 65–99)
Potassium: 3.9 mmol/L (ref 3.5–5.2)
Sodium: 141 mmol/L (ref 134–144)
Total Protein: 7.1 g/dL (ref 6.0–8.5)

## 2018-08-01 LAB — POCT GLYCOSYLATED HEMOGLOBIN (HGB A1C): Hemoglobin A1C: 6.2 % — AB (ref 4.0–5.6)

## 2018-08-01 MED ORDER — TRIAMCINOLONE ACETONIDE 10 MG/ML IJ SUSP
10.0000 mg | Freq: Once | INTRAMUSCULAR | Status: AC
  Administered 2018-08-01: 10 mg

## 2018-08-01 MED ORDER — GABAPENTIN 300 MG PO CAPS: 300.0000 mg | ORAL_CAPSULE | Freq: Three times a day (TID) | ORAL | 3 refills | Status: DC

## 2018-08-01 MED ORDER — DICLOFENAC SODIUM 75 MG PO TBEC: 75.0000 mg | DELAYED_RELEASE_TABLET | Freq: Two times a day (BID) | ORAL | 2 refills | Status: DC

## 2018-08-01 NOTE — Patient Instructions (Signed)

## 2018-08-01 NOTE — Patient Instructions (Signed)
Athlete's Foot    Athlete's foot (tinea pedis) is a fungal infection of the skin on your feet. It often occurs on the skin that is between or underneath the toes. It can also occur on the soles of your feet. The infection can spread from person to person (is contagious). It can also spread when a person's bare feet come in contact with the fungus on shower floors or on items such as shoes.  What are the causes?  This condition is caused by a fungus that grows in warm, moist places. You can get athlete's foot by sharing shoes, shower stalls, towels, and wet floors with someone who is infected. Not washing your feet or changing your socks often enough can also lead to athlete's foot.  What increases the risk?  This condition is more likely to develop in:  · Men.  · People who have a weak body defense system (immune system).  · People who have diabetes.  · People who use public showers, such as at a gym.  · People who wear heavy-duty shoes, such as industrial or military shoes.  · Seasons with warm, humid weather.  What are the signs or symptoms?  Symptoms of this condition include:  · Itchy areas between your toes or on the soles of your feet.  · White, flaky, or scaly areas between your toes or on the soles of your feet.  · Very itchy small blisters between your toes or on the soles of your feet.  · Small cuts in your skin. These cuts can become infected.  · Thick or discolored toenails.  How is this diagnosed?  This condition may be diagnosed with a physical exam and a review of your medical history. Your health care provider may also take a skin or toenail sample to examine under a microscope.  How is this treated?  This condition is treated with antifungal medicines. These may be applied as powders, ointments, or creams. In severe cases, an oral antifungal medicine may be given.  Follow these instructions at home:  Medicines  · Apply or take over-the-counter and prescription medicines only as told by your health  care provider.  · Apply your antifungal medicine as told by your health care provider. Do not stop using the antifungal even if your condition improves.  Foot care  · Do not scratch your feet.  · Keep your feet dry:  ? Wear cotton or wool socks. Change your socks every day or if they become wet.  ? Wear shoes that allow air to flow, such as sandals or canvas tennis shoes.  · Wash and dry your feet, including the area between your toes. Also, wash and dry your feet:  ? Every day or as told by your health care provider.  ? After exercising.  General instructions  · Do not let others use towels, shoes, nail clippers, or other personal items that touch your feet.  · Protect your feet by wearing sandals in wet areas, such as locker rooms and shared showers.  · Keep all follow-up visits as told by your health care provider. This is important.  · If you have diabetes, keep your blood sugar under control.  Contact a health care provider if:  · You have a fever.  · You have swelling, soreness, warmth, or redness in your foot.  · Your feet are not getting better with treatment.  · Your symptoms get worse.  · You have new symptoms.  Summary  · Athlete's   foot (tinea pedis) is a fungal infection of the skin on your feet. It often occurs on skin that is between or underneath the toes.  · This condition is caused by a fungus that grows in warm, moist places.  · Symptoms include white, flaky, or scaly areas between your toes or on the soles of your feet.  · This condition is treated with antifungal medicines.  · Keep your feet clean. Always dry them thoroughly.  This information is not intended to replace advice given to you by your health care provider. Make sure you discuss any questions you have with your health care provider.  Document Released: 05/20/2000 Document Revised: 03/13/2017 Document Reviewed: 03/13/2017  [employer]vier Interactive Patient Education © 2019 [employer]

## 2018-08-01 NOTE — Progress Notes (Signed)
2/26/20208:14 AM  Casey Hamilton [birth year], 33 y.o. female [id number]  Chief Complaint  Patient presents with  . Follow-up    Prediabetes    HPI:   Patient is a 33 y.o. female with past medical history significant for prediabtes who presents today for routine followup  Overall she is feeling fine Trying to eat healthier Being more active  Having burning pain in both feet, worse on the left, intermittent Has back pain, sometimes radiates down into her legs Also having pain on left heel she has been sitting for a while and starts to walk, Has known heel spurs, has had surgery on right foot for heel spurs  Concerned about snoring She does not wake up rested,  She can easily fall asleep during the day  Wt Readings from Last 3 Encounters:  08/01/18 202 lb (91.6 kg)  03/16/18 216 lb 12.8 oz (98.3 kg)  01/26/18 214 lb (97.1 kg)    Lab Results  Component Value Date   HGBA1C 6.2 (H) 01/26/2018   HGBA1C 6.2 02/20/2017   HGBA1C 6.0 02/01/2016   Lab Results  Component Value Date   LDLCALC 85 03/16/2018   CREATININE 0.68 03/16/2018    Fall Risk  08/01/2018 03/16/2018 01/26/2018 02/20/2017 11/28/2016  Falls in the past year? 0 No No No No  Number falls in past yr: 0 - - - -  Injury with Fall? 0 - - - -     Depression screen [facility] 2/9 08/01/2018 03/16/2018 01/26/2018  Decreased Interest 0 0 0  Down, Depressed, Hopeless 0 0 0  PHQ - 2 Score 0 0 0    Allergies  Allergen Reactions  . Amoxicillin Hives    Prior to Admission medications   Not on File    Past Medical History:  Diagnosis Date  . Allergy   . Anemia   . Contact dermatitis   . Medical history non-contributory   . Prediabetes     Past Surgical History:  Procedure Laterality Date  . FOOT SURGERY Right   . TUBAL LIGATION Bilateral 12/04/2013   Procedure: POST PARTUM TUBAL LIGATION;  Surgeon: Ena Dawley, MD;  Location: [facility];  Service: Gynecology;  Laterality: Bilateral;  . WISDOM TOOTH  EXTRACTION      Social History   Tobacco Use  . Smoking status: Never Smoker  . Smokeless tobacco: Never Used  Substance Use Topics  . Alcohol use: Yes    Comment: cocktail every few months    Family History  Problem Relation Age of Onset  . Hypertension Mother   . COPD Maternal Grandfather   . Diabetes Maternal Grandfather   . Epilepsy Paternal Grandmother   . Breast cancer Maternal Aunt   . Prostate cancer Maternal Uncle     Review of Systems  Constitutional: Negative for chills and fever.  Respiratory: Negative for cough and shortness of breath.   Cardiovascular: Negative for chest pain, palpitations and leg swelling.  Gastrointestinal: Negative for abdominal pain, nausea and vomiting.   Per hpi  OBJECTIVE:  Blood pressure 123/86, pulse 68, temperature 98.4 F (36.9 C), temperature source Oral, height 5' 7.5" (1.715 m), weight 202 lb (91.6 kg), SpO2 98 %, unknown if currently breastfeeding. Body mass index is 31.17 kg/m.   Physical Exam Vitals signs and nursing note reviewed.  Constitutional:      Appearance: She is well-developed.  HENT:     Head: Normocephalic and atraumatic.     Mouth/Throat:     Pharynx: No oropharyngeal exudate.  Eyes:     General: No scleral icterus.    Conjunctiva/sclera: Conjunctivae normal.     Pupils: Pupils are equal, round, and reactive to light.  Neck:     Musculoskeletal: Neck supple.  Cardiovascular:     Rate and Rhythm: Normal rate and regular rhythm.     Pulses:          Dorsalis pedis pulses are 2+ on the right side and 2+ on the left side.       Posterior tibial pulses are 2+ on the right side and 2+ on the left side.     Heart sounds: Normal heart sounds. No murmur. No friction rub. No gallop.   Pulmonary:     Effort: Pulmonary effort is normal.     Breath sounds: Normal breath sounds. No wheezing or rales.  Musculoskeletal:     Lumbar back: She exhibits bony tenderness. She exhibits normal range of motion and no  spasm.  Feet:     Right foot:     Protective Sensation: 10 sites tested. 7 sites sensed.     Skin integrity: Erythema and fissure present.     Toenail Condition: Fungal disease present.    Left foot:     Protective Sensation: 10 sites tested. 7 sites sensed.     Skin integrity: Erythema and fissure present.     Toenail Condition: Fungal disease present. Skin:    General: Skin is warm and dry.  Neurological:     Mental Status: She is alert and oriented to person, place, and time.     Motor: Motor function is intact.     Gait: Gait is intact.     Deep Tendon Reflexes:     Reflex Scores:      Patellar reflexes are 1+ on the right side and 2+ on the left side.      Achilles reflexes are 1+ on the right side and 1+ on the left side.    Results for orders placed or performed in visit on 08/01/18 (from the past 24 hour(s))  POCT glycosylated hemoglobin (Hb A1C)     Status: Abnormal   Collection Time: 08/01/18  8:51 AM  Result Value Ref Range   Hemoglobin A1C 6.2 (A) 4.0 - 5.6 %   HbA1c POC (<> result, manual entry)     HbA1c, POC (prediabetic range)     HbA1c, POC (controlled diabetic range)       ASSESSMENT and PLAN  1. Prediabetes a1c remains stable. Congratulated weight loss. Cont with LFM - CMP14+EGFR - POCT glycosylated hemoglobin (Hb A1C)  2. Snoring 3. Daytime sleepiness - Ambulatory referral to Sleep Studies  4. Chronic heel pain, left - Ambulatory referral to Podiatry  5. Tinea pedis of both feet Discussed use of OTC medications and proper care, if after appropriate trial not resolved, will do oral medication. Patient educational handout given  6. Paresthesia of both feet Unclear, possible from presumed DDD, intermittent for now, no weakness, will do trial of gabapentin and home exercises for back pain.   Other orders - gabapentin (NEURONTIN) 300 MG capsule; Take 1 capsule (300 mg total) by mouth 3 (three) times daily.   Return in about 6 months (around  01/30/2019).    Rutherford Guys, MD [facility] [place], [place] [address] Ph.  [phone] Fax [phone]

## 2018-08-02 NOTE — Progress Notes (Signed)
Subjective:   Patient ID: Casey Hamilton, female   DOB: 33 y.o.   MRN: [id number]   HPI Patient presents with significant plantar fascial symptomatology left over right 6 months duration with pain worse when getting up in the morning after periods of sitting and is just started gabapentin with some numbness in the left foot and also is concerned about nail disease and peeling skin bilateral patient states these heels have gotten worse over that time and are making it hard to be ambulatory and patient does not smoke and likes to be active   Review of Systems  All other systems reviewed and are negative.       Objective:  Physical Exam Vitals signs and nursing note reviewed.  Constitutional:      Appearance: She is well-developed.  Pulmonary:     Effort: Pulmonary effort is normal.  Musculoskeletal: Normal range of motion.  Skin:    General: Skin is warm.  Neurological:     Mental Status: She is alert.     Neurovascular status intact muscle strength was found to be adequate range of motion within normal limits with acute inflammation plantar aspect heel region left and right foot at the insertion tendon calcaneus with moderate peeling skin and also noted to have nail disease bilateral with no indications of diminishment sharp dull vibratory bilateral.  Patient has good digital perfusion well oriented x3     Assessment:  Acute plantar fasciitis bilateral with inflammation fluid with mild mycotic nail infection and skin disease and mild numbness left with no organic cause     Plan:  H&P reviewed all conditions and today I am get a focus on the acute plantar fasciitis.  I did sterile prep of each tendon and injected the fascial insertion 3 mg Kenalog 5 mg Xylocaine and advised on anti-inflammatories physical therapy supportive shoe gear usage.  Patient also had fascial brace is administered bilateral with instructions on usage and instructed on physical therapy and discussed  possible nail treatments at next visit  X-rays indicate there is spur formation with no indication to stress fracture or advanced arthritis

## 2018-08-17 ENCOUNTER — Ambulatory Visit (INDEPENDENT_AMBULATORY_CARE_PROVIDER_SITE_OTHER): Payer: 59 | Admitting: Podiatry

## 2018-08-17 ENCOUNTER — Other Ambulatory Visit: Payer: Self-pay

## 2018-08-17 ENCOUNTER — Encounter: Payer: Self-pay | Admitting: Podiatry

## 2018-08-17 DIAGNOSIS — M722 Plantar fascial fibromatosis: Secondary | ICD-10-CM

## 2018-08-17 MED ORDER — PREDNISONE 10 MG PO TABS: ORAL_TABLET | ORAL | 0 refills | Status: DC

## 2018-08-17 MED ORDER — TRIAMCINOLONE ACETONIDE 10 MG/ML IJ SUSP
10.0000 mg | Freq: Once | INTRAMUSCULAR | Status: AC
  Administered 2018-08-17: 10 mg

## 2018-08-20 NOTE — Progress Notes (Signed)
Subjective:   Patient ID: Casey Hamilton, female   DOB: 33 y.o.   MRN: [id number]   HPI Patient states her heels are still really bothering her with the left bothering more than the right but are still both sore with the left foot being quite intense.  Patient states is very hard to walk on it is especially worse when she gets up in the morning and after sitting   ROS      Objective:  Physical Exam  Acute plantar fasciitis heel left over right with inflammation fluid at the attachment     Assessment:  Pain upon palpation to the plantar fascial bilateral left over right medial side     Plan:  Reviewed condition and at this point I did do sterile prep and reinjected the fascia bilateral 3 mg Kenalog 5 mg Xylocaine and applied night splint with instructions for aggressive ice therapy educating patient on how to do this properly.  Reappoint to recheck

## 2018-09-05 ENCOUNTER — Telehealth: Payer: Self-pay

## 2018-09-05 NOTE — Telephone Encounter (Signed)
Due to current COVID 19 pandemic, our office is severely reducing in office visits for at least the next 2 weeks, in order to minimize the risk to our patients and healthcare providers.   I called pt to discuss her appointment and offer her a virtual visit. No answer, left a message asking her to call me back.

## 2018-09-05 NOTE — Telephone Encounter (Signed)
Pt has returned the call to RN Baxter Hire, the message was relayed to pt and she did give verbal consent to file her insurance for a virtual visit.  Please call

## 2018-09-05 NOTE — Telephone Encounter (Signed)
Pt returned my call. She is agreeable to a virtual visit at her appt time on 09/11/2018.  Pt understands that although there may be some limitations with this type of visit, we will take all precautions to reduce any security or privacy concerns.  Pt understands that this will be treated like an in office visit and we will file with pt's insurance, and there may be a patient responsible charge related to this service.  Pt's email is [email or web address]. Pt understands that the cis[employer] webex software must be downloaded and operational on the device pt plans to use for the visit.  Pt's meds, allergies, and PMH were updated.  Pt has never had a sleep study but does endorse snoring. Pt drinks about 3 cups of caffeine daily.  Pt was instructed on how to measure her neck size prior to her appt.  Pt's last weight was 205 lb and she is 5'7.  Epworth Sleepiness Scale 0= would never doze 1= slight chance of dozing 2= moderate chance of dozing 3= high chance of dozing  Sitting and reading: 2 Watching TV: 2 Sitting inactive in a public place (ex. Theater or meeting): 0 As a passenger in a car for an hour without a break: 2 Lying down to rest in the afternoon: 3 Sitting and talking to someone: 0 Sitting quietly after lunch (no alcohol): 2 In a car, while stopped in traffic: 0 Total: 11  FSS: 30

## 2018-09-05 NOTE — Telephone Encounter (Signed)
I called pt again to discuss. No answer, left a message asking her to call me back. 

## 2018-09-05 NOTE — Addendum Note (Signed)
Addended by: Geronimo Running A on: 09/05/2018 01:43 PM   Modules accepted: Orders

## 2018-09-07 ENCOUNTER — Other Ambulatory Visit: Payer: Self-pay

## 2018-09-07 ENCOUNTER — Encounter: Payer: Self-pay | Admitting: Podiatry

## 2018-09-07 ENCOUNTER — Ambulatory Visit (INDEPENDENT_AMBULATORY_CARE_PROVIDER_SITE_OTHER): Payer: 59 | Admitting: Podiatry

## 2018-09-07 VITALS — Temp 98.3°F

## 2018-09-07 DIAGNOSIS — M722 Plantar fascial fibromatosis: Secondary | ICD-10-CM | POA: Diagnosis not present

## 2018-09-07 DIAGNOSIS — M7661 Achilles tendinitis, right leg: Secondary | ICD-10-CM | POA: Diagnosis not present

## 2018-09-07 DIAGNOSIS — M7662 Achilles tendinitis, left leg: Secondary | ICD-10-CM

## 2018-09-07 MED ORDER — TRIAMCINOLONE ACETONIDE 10 MG/ML IJ SUSP
10.0000 mg | Freq: Once | INTRAMUSCULAR | Status: AC
  Administered 2018-09-07: 10 mg

## 2018-09-07 NOTE — Progress Notes (Signed)
Subjective:   Patient ID: Casey Hamilton, female   DOB: 33 y.o.   MRN: [id number]   HPI Patient presents stating the heel is slightly improved but I seem like I am getting pain that is more in the back of the heel left and right.  States it still been tender and she is concerned about the condition and she has been utilizing the night splint   ROS      Objective:  Physical Exam  Neurovascular status intact with inflammation pain of the posterior heel medial side left over right at the insertion Achilles with no central lateral pain.  Plantar fasciitis bilateral present but improved     Assessment:  Achilles tendinitis left over right with plantar fascial symptomatology of a mild nature bilateral     Plan:  H&P reviewed the differences between Achilles tendinitis plantar fasciitis and at this time discussed Achilles tendon injection explaining risk of the injection.  Patient wants this done understanding risk of rupture and today I did a sterile prep of the medial side left posterior heel and carefully injected 3 mg dexamethasone Kenalog 5 mg Xylocaine advised on reduced activity and reappoint in 2 weeks we may consider injection of the right if she responds well

## 2018-09-11 ENCOUNTER — Other Ambulatory Visit: Payer: Self-pay

## 2018-09-11 ENCOUNTER — Encounter: Payer: Self-pay | Admitting: Neurology

## 2018-09-11 ENCOUNTER — Ambulatory Visit (INDEPENDENT_AMBULATORY_CARE_PROVIDER_SITE_OTHER): Payer: 59 | Admitting: Neurology

## 2018-09-11 ENCOUNTER — Telehealth: Payer: Self-pay | Admitting: Neurology

## 2018-09-11 NOTE — Telephone Encounter (Signed)
error 

## 2018-09-11 NOTE — Progress Notes (Signed)
Visit not done today. Patient did not join and did not pick up phone.

## 2018-09-11 NOTE — Telephone Encounter (Signed)
See telephone note from 09/05/2018.

## 2018-09-11 NOTE — Telephone Encounter (Signed)
Due to current COVID 19 pandemic, our office is severely reducing in office visits for at least the next 2 weeks, in order to minimize the risk to our patients and healthcare providers.   Patient called this afternoon to follow-up on missed virtual visit the morning of 09/11/18. The patient and doctor were unable to connect at the scheduled time, however patient requested to reschedule the appointment. I offered her a few times in the current week. Patient stated she works and her hours vary. I rescheduled her for 09/17/18 at 9:00 AM. I advised that she will be receiving an updated e-mail with a new link/access code. She verbalized understanding.  Pt understands that although there may be some limitations with this type of visit, we will take all precautions to reduce any security or privacy concerns.  Pt understands that this will be treated like an in office visit and we will file with pt's insurance, and there may be a patient responsible charge related to this service.  Pt's email is [email or web address] . Pt understands that the cisco webex software must be downloaded and operational on the device pt plans to use for the visit.

## 2018-09-17 ENCOUNTER — Encounter: Payer: Self-pay | Admitting: Neurology

## 2018-09-17 ENCOUNTER — Other Ambulatory Visit: Payer: Self-pay

## 2018-09-17 ENCOUNTER — Ambulatory Visit (INDEPENDENT_AMBULATORY_CARE_PROVIDER_SITE_OTHER): Payer: 59 | Admitting: Neurology

## 2018-09-17 DIAGNOSIS — R51 Headache: Secondary | ICD-10-CM

## 2018-09-17 DIAGNOSIS — R519 Headache, unspecified: Secondary | ICD-10-CM

## 2018-09-17 DIAGNOSIS — R351 Nocturia: Secondary | ICD-10-CM

## 2018-09-17 DIAGNOSIS — E669 Obesity, unspecified: Secondary | ICD-10-CM

## 2018-09-17 DIAGNOSIS — R0683 Snoring: Secondary | ICD-10-CM

## 2018-09-17 DIAGNOSIS — G4719 Other hypersomnia: Secondary | ICD-10-CM

## 2018-09-17 DIAGNOSIS — R0681 Apnea, not elsewhere classified: Secondary | ICD-10-CM | POA: Diagnosis not present

## 2018-09-17 NOTE — Progress Notes (Signed)
Huston FoleySaima Leslyn Monda, MD, PhD [facility] [address], [address] [address] [place], [place] [address]   Virtual Visit via Video Note on 09/17/2018:  I connected with Casey Hamilton on 09/17/18 at  9:00 AM EDT by a video enabled telemedicine application and verified that I am speaking with the correct person using two identifiers.   I discussed the limitations of evaluation and management by telemedicine and the availability of in person appointments. The patient expressed understanding and agreed to proceed.  History of Present Illness:  Casey Hamilton is a 33 year old right-handed woman with an underlying medical history of dermatitis, prediabetes, anemia, allergies, and obesity, with whom I am conducting a virtual, video based new patient visit via Webex in lieu of a face-to-face visit for evaluation of her sleep disorder, in particular, evaluation for obstructive sleep apnea. The patient is unaccompanied today and joins via cell phone. She is referred by her primary care physician, Dr. Koren ShiverIrma Santiago, and I reviewed her note from 08/01/2018. Her Epworth sleepiness score is 11 out of 24, fatigue severely score is 30 out of 63.  Her most recent vital signs on file for me to review are from 08/01/2018: Blood pressure 113/67 with a pulse of 76, weight 202 for a BMI of 31.17. Of note, she presented to the emergency room on 07/11/2018 with flulike symptoms. I reviewed the emergency room records, temperature was 101.6 at the time.  She reports feeling better in that regard. She reports snoring and daytime somnolence, sleep disruption, occasional difficulty falling asleep. Bedtime around 11 and rise time around 6. 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 She works from 7 AM to 3 PM Mondays through Fridays and every other weekend. She lives with her fianc and 3 daughters. She has been told by her fianc that she has pauses in her breathing while asleep. She denies a family history  of OSA, has not had a tonsillectomy. She drinks caffeine in the form of soda, 2 per day on average, occasional alcohol, is a nonsmoker. They have no pets in the household. She does have a TV in the bedroom and limits on at night she has it on a music channel and it is on a sleep timer sometimes. She has woken up with a headache occasionally, describes it as dull and achy, no history of migraines. She has nocturia about once per average night.  Her Past Medical History Is Significant For: Past Medical History:  Diagnosis Date  . Allergy   . Anemia   . Contact dermatitis   . Medical history non-contributory   . Prediabetes     Her Past Surgical History Is Significant For: Past Surgical History:  Procedure Laterality Date  . FOOT SURGERY Right   . TUBAL LIGATION Bilateral 12/04/2013   Procedure: POST PARTUM TUBAL LIGATION;  Surgeon: Kirkland HunArthur Stringer, MD;  Location: [facility];  Service: Gynecology;  Laterality: Bilateral;  . WISDOM TOOTH EXTRACTION      Her Family History Is Significant For: Family History  Problem Relation Age of Onset  . Hypertension Mother   . COPD Maternal Grandfather   . Diabetes Maternal Grandfather   . Epilepsy Paternal Grandmother   . Breast cancer Maternal Aunt   . Prostate cancer Maternal Uncle     Her Social History Is Significant For: Social History   Socioeconomic History  . Marital status: Divorced    Spouse name: Not on file  . Number of children: 2  .  Years of education: Not on file  . Highest education level: Not on file  Occupational History  . Not on file  Social Needs  . Financial resource strain: Not on file  . Food insecurity:    Worry: Not on file    Inability: Not on file  . Transportation needs:    Medical: Not on file    Non-medical: Not on file  Tobacco Use  . Smoking status: Never Smoker  . Smokeless tobacco: Never Used  Substance and Sexual Activity  . Alcohol use: Yes    Comment: cocktail every few months  . Drug use: No   . Sexual activity: Yes    Birth control/protection: Surgical    Comment: s/p tubal ligation  Lifestyle  . Physical activity:    Days per week: Not on file    Minutes per session: Not on file  . Stress: Not on file  Relationships  . Social connections:    Talks on phone: Not on file    Gets together: Not on file    Attends religious service: Not on file    Active member of club or organization: Not on file    Attends meetings of clubs or organizations: Not on file    Relationship status: Not on file  Other Topics Concern  . Not on file  Social History Narrative  . Not on file    Her Allergies Are:  Allergies  Allergen Reactions  . Amoxicillin Hives  :   Her Current Medications Are:  Outpatient Encounter Medications as of 09/17/2018  Medication Sig  . diclofenac (VOLTAREN) 75 MG EC tablet Take 1 tablet (75 mg total) by mouth 2 (two) times daily.  Marland Kitchen gabapentin (NEURONTIN) 300 MG capsule Take 1 capsule (300 mg total) by mouth 3 (three) times daily.   No facility-administered encounter medications on file as of 09/17/2018.   :   Review of Systems:  Out of a complete 14 point review of systems, all are reviewed and negative with the exception of these symptoms as listed below:  Observations/Objective: Her neck circumference by self-report is 17 inches. She is pleasant, conversant, in no acute distress. Face is symmetric, speech clear without dysarthria, hypophonia or voice tremor. She wears corrective eyeglasses. Extraocular movements are intact grossly. Airway examination reveals a Mallampati class II. Tongue protrudes centrally and palate elevates symmetrically. She has a thicker appearing soft palate but tonsils are not fully visualized. She has a minimal overbite. Upper body movements and upper extremity coordination grossly intact.   Assessment and Plan: Casey Hamilton is a 33 year old right-handed woman with an underlying medical history of dermatitis, prediabetes,  anemia, allergies, and obesity, with whom I am conducting a virtual, video based new patient visit via Webex in lieu of a face-to-face visit for evaluation of an underlying organic sleep disorder, in particular, concern for obstructive sleep apnea. The patient's medical history and physical exam (albeit limited with current video-based evaluation) are concerning for a diagnosis of obstructive sleep apnea. I discussed with the patient the diagnosis of OSA, its prognosis and treatment options. I explained in particular the risks and ramifications of untreated moderate to severe OSA, especially with respect to developing cardiovascular disease down the Road, including congestive heart failure, difficult to treat hypertension, cardiac arrhythmias, or stroke. Even type 2 diabetes has, in part, been linked to untreated OSA. Symptoms of untreated OSA may include daytime sleepiness, memory problems, mood irritability and mood disorder such as depression and anxiety, lack of energy,  as well as recurrent headaches, especially morning headaches. We talked about the importance of weight control. We talked about the importance of maintaining good sleep hygiene. I recommended the following at this time: home sleep test.  I explained the sleep test procedure to the patient and also outlined possible treatment options of OSA, including the use of a custom-made dental device (which would require a referral to a specialist dentist), upper airway surgical options, (such as UPPP, which would involve a referral to an ENT). I also explained the CPAP vs. AutoPAP treatment option to the patient, who indicated that she would be willing to try CPAP or autoPAP if the need arises. I answered all her questions today and the patient was in agreement. I plan to see the patient back after the sleep study is completed and encouraged her to call with any interim questions, concerns, problems or updates.   Huston Foley, MD, PhD   Follow Up  Instructions: 1. HST, single user, disposable unit, cloud-based data collection and retrieval. Sleep lab staff will reach out to patient to arrange for sending the unit to home address and for tutorial, making sure patient is comfortable with the unit and smart phone app. 2. Consider AutoPap therapy if home sleep test positive for obstructive sleep apnea. 3. Follow-up after starting AutoPap therapy, follow-up to be scheduled according to set-up date. 4. Pursue healthy lifestyle, good sleep hygiene, weight loss. 5. Call for any interim questions or concerns.   I discussed the assessment and treatment plan with the patient. The patient was provided an opportunity to ask questions and all were answered. The patient agreed with the plan and demonstrated an understanding of the instructions.   The patient was advised to call back or seek an in-person evaluation if the symptoms worsen or if the condition fails to improve as anticipated.  I provided 30 minutes of non-face-to-face time during this encounter.   Huston Foley, MD

## 2018-09-17 NOTE — Patient Instructions (Signed)
Given verbally, during today's virtual video-based encounter, with verbal feedback received.   

## 2018-09-20 ENCOUNTER — Ambulatory Visit: Payer: 59 | Admitting: Podiatry

## 2018-09-21 ENCOUNTER — Ambulatory Visit: Payer: 59 | Admitting: Podiatry

## 2018-09-24 ENCOUNTER — Ambulatory Visit (INDEPENDENT_AMBULATORY_CARE_PROVIDER_SITE_OTHER): Payer: 59 | Admitting: Podiatry

## 2018-09-24 ENCOUNTER — Encounter: Payer: Self-pay | Admitting: Podiatry

## 2018-09-24 ENCOUNTER — Other Ambulatory Visit: Payer: Self-pay

## 2018-09-24 VITALS — Temp 97.9°F

## 2018-09-24 DIAGNOSIS — M7661 Achilles tendinitis, right leg: Secondary | ICD-10-CM | POA: Diagnosis not present

## 2018-09-24 MED ORDER — TRIAMCINOLONE ACETONIDE 10 MG/ML IJ SUSP
10.0000 mg | Freq: Once | INTRAMUSCULAR | Status: AC
  Administered 2018-09-24: 10 mg

## 2018-09-24 MED ORDER — DICLOFENAC SODIUM 75 MG PO TBEC: 75.0000 mg | DELAYED_RELEASE_TABLET | Freq: Two times a day (BID) | ORAL | 2 refills | Status: DC

## 2018-09-24 NOTE — Progress Notes (Signed)
Subjective:   Patient ID: Casey Hamilton, female   DOB: 33 y.o.   MRN: [id number]   HPI Patient states it seems like the left ankle is a little bit better but still sore but the right one is been very tender and she had wanted to get an injection in that area   ROS      Objective:  Physical Exam  Patient continues to have specific area of inflammation in the Achilles tendon medial side right over left with the left having responded relatively well to injection in the right being quite sore     Assessment:  Possibility for some form of Achilles tendinitis or other inflammatory condition as it slightly medial to the Achilles insertion     Plan:  I did recommend injection to the right and I discussed risk and she wants injection which was carefully administered 3 mg Dexasone Kenalog 5 mg Xylocaine and I then advised on heat ice therapy and I placed her back on diclofenac 75 mg twice daily.  Reappoint 3 weeks to see results

## 2018-09-27 ENCOUNTER — Encounter (HOSPITAL_COMMUNITY): Payer: Self-pay

## 2018-09-27 ENCOUNTER — Emergency Department (HOSPITAL_COMMUNITY)
Admission: EM | Admit: 2018-09-27 | Discharge: 2018-09-27 | Disposition: A | Discharge status: Home / Self Care | Payer: 59 | Attending: Emergency Medicine | Admitting: Emergency Medicine

## 2018-09-27 ENCOUNTER — Other Ambulatory Visit: Payer: Self-pay

## 2018-09-27 ENCOUNTER — Emergency Department (HOSPITAL_COMMUNITY): Payer: 59

## 2018-09-27 DIAGNOSIS — R042 Hemoptysis: Secondary | ICD-10-CM | POA: Diagnosis not present

## 2018-09-27 DIAGNOSIS — Z79899 Other long term (current) drug therapy: Secondary | ICD-10-CM | POA: Diagnosis not present

## 2018-09-27 DIAGNOSIS — R05 Cough: Secondary | ICD-10-CM | POA: Diagnosis not present

## 2018-09-27 NOTE — ED Provider Notes (Signed)
[facility]-EMERGENCY DEPT Provider Note   CSN: [id number] Arrival date & time: 09/27/18  [id number]    History   Chief Complaint Chief Complaint  Patient presents with  . Hemoptysis    HPI Casey Hamilton is a 33 y.o. female.     HPI 33 year old female presents the emergency department after an episode of coughing up blood this morning upon wakening.  She felt as though there is a fullness in the back of her throat when she coughed and some bright red blood came up.  No shortness of breath.  No fevers or chills.  No respiratory illness recently.  No use of anticoagulants.  No other complaints.  No neck pain or swelling.  No history of cancer.    Past Medical History:  Diagnosis Date  . Allergy   . Anemia   . Contact dermatitis   . Medical history non-contributory   . Prediabetes     Patient Active Problem List   Diagnosis Date Noted  . Allergy to amoxicillin 08/01/2018  . Low back pain 08/01/2018  . Pregnant 08/01/2018  . Vaginal discharge 08/01/2018  . Prediabetes 02/20/2017  . Atopic dermatitis 02/01/2016  . Other seasonal allergic rhinitis 02/01/2016  . Menorrhagia with regular cycle 02/01/2016  . S/P tubal ligation 12/05/2013  . Anemia 12/05/2013  . Rectal bleeding - consult to GI; neg eval 12/03/2013    Past Surgical History:  Procedure Laterality Date  . FOOT SURGERY Right   . TUBAL LIGATION Bilateral 12/04/2013   Procedure: POST PARTUM TUBAL LIGATION;  Surgeon: Kirkland HunArthur Stringer, MD;  Location: [facility];  Service: Gynecology;  Laterality: Bilateral;  . WISDOM TOOTH EXTRACTION       OB History    Gravida  3   Para  3   Term  3   Preterm      AB      Living  3     SAB      TAB      Ectopic      Multiple      Live Births  1            Home Medications    Prior to Admission medications   Medication Sig Start Date End Date Taking? Authorizing Provider  diclofenac (VOLTAREN) 75 MG EC tablet Take 1 tablet (75 mg  total) by mouth 2 (two) times daily. Patient not taking: Reported on 09/27/2018 08/01/18   Lenn Sinkegal, Norman S, DPM  diclofenac (VOLTAREN) 75 MG EC tablet Take 1 tablet (75 mg total) by mouth 2 (two) times daily. 09/24/18   Lenn Sinkegal, Norman S, DPM  gabapentin (NEURONTIN) 300 MG capsule Take 1 capsule (300 mg total) by mouth 3 (three) times daily. 08/01/18   Myles LippsSantiago, Irma M, MD    Family History Family History  Problem Relation Age of Onset  . Hypertension Mother   . COPD Maternal Grandfather   . Diabetes Maternal Grandfather   . Epilepsy Paternal Grandmother   . Breast cancer Maternal Aunt   . Prostate cancer Maternal Uncle     Social History Social History   Tobacco Use  . Smoking status: Never Smoker  . Smokeless tobacco: Never Used  Substance Use Topics  . Alcohol use: Yes    Comment: cocktail every few months  . Drug use: No     Allergies   Amoxicillin   Review of Systems Review of Systems  All other systems reviewed and are negative.    Physical Exam Updated  Vital Signs BP (!) 132/94 (BP Location: Right Arm)   Pulse 80   Temp 98.2 F (36.8 C) (Oral)   Resp 16   Ht 5\' 7"  (1.702 m)   Wt 93 kg   LMP 09/14/2018   SpO2 99%   BMI 32.11 kg/m   Physical Exam Vitals signs and nursing note reviewed.  Constitutional:      Appearance: She is well-developed.  HENT:     Head: Normocephalic and atraumatic.     Comments: Mild swelling of the uvula with stigmata of recent bleeding.  No active bleeding.  Posterior pharynx is otherwise normal tongue is normal in size.  No trismus.  No posterior pharyngeal erythema.  Tolerating secretions.  No stridor.  Oral airway patent. Neck:     Musculoskeletal: Normal range of motion.  Pulmonary:     Effort: Pulmonary effort is normal.  Abdominal:     General: There is no distension.  Musculoskeletal: Normal range of motion.  Neurological:     Mental Status: She is alert and oriented to person, place, and time.      ED Treatments  / Results  Labs (all labs ordered are listed, but only abnormal results are displayed) Labs Reviewed - No data to display  EKG None  Radiology Dg Chest 2 View  Result Date: 09/27/2018 CLINICAL DATA:  Hemoptysis EXAM: CHEST - 2 VIEW COMPARISON:  March 22, 2016 FINDINGS: Lungs are clear. Heart size and pulmonary vascularity are normal. No adenopathy. No bone lesions. IMPRESSION: No edema or consolidation. A cause for hemoptysis has not been established with this study. Electronically Signed   By: Bretta Bang III M.D.   On: 09/27/2018 08:25    Procedures Procedures (including critical care time)  Medications Ordered in ED Medications - No data to display   Initial Impression / Assessment and Plan / ED Course  I have reviewed the triage vital signs and the nursing notes.  Pertinent labs & imaging results that were available during my care of the patient were reviewed by me and considered in my medical decision making (see chart for details).        Mild inflammation of the uvula with associated bleeding.  No active bleeding at this time.  No additional hemoptysis.  Chest x-ray clear.  Posterior pharynx is normal.  Vital signs are stable.  No indication for additional work-up or treatment at this time.  Patient was given a picture of her uvula on her phone.  Final Clinical Impressions(s) / ED Diagnoses   Final diagnoses:  Hemoptysis    ED Discharge Orders    None       Azalia Bilis, MD 09/27/18 [phone]

## 2018-09-27 NOTE — ED Notes (Signed)
Patient transported to X-ray 

## 2018-09-27 NOTE — ED Triage Notes (Signed)
Pt states she woke up at 0600 and began coughing up blood. Pt denies any cough prior.

## 2018-10-10 ENCOUNTER — Encounter (HOSPITAL_COMMUNITY): Payer: Self-pay

## 2018-10-10 ENCOUNTER — Telehealth: Payer: 59 | Admitting: Family Medicine

## 2018-10-10 ENCOUNTER — Emergency Department (HOSPITAL_COMMUNITY)
Admission: EM | Admit: 2018-10-10 | Discharge: 2018-10-10 | Disposition: A | Discharge status: Home / Self Care | Payer: 59 | Attending: Emergency Medicine | Admitting: Emergency Medicine

## 2018-10-10 ENCOUNTER — Other Ambulatory Visit: Payer: Self-pay

## 2018-10-10 DIAGNOSIS — W260XXA Contact with knife, initial encounter: Secondary | ICD-10-CM | POA: Diagnosis not present

## 2018-10-10 DIAGNOSIS — S6992XA Unspecified injury of left wrist, hand and finger(s), initial encounter: Secondary | ICD-10-CM | POA: Diagnosis present

## 2018-10-10 DIAGNOSIS — S61315A Laceration without foreign body of left ring finger with damage to nail, initial encounter: Secondary | ICD-10-CM

## 2018-10-10 DIAGNOSIS — Z79899 Other long term (current) drug therapy: Secondary | ICD-10-CM | POA: Insufficient documentation

## 2018-10-10 DIAGNOSIS — Y929 Unspecified place or not applicable: Secondary | ICD-10-CM | POA: Diagnosis not present

## 2018-10-10 DIAGNOSIS — Y999 Unspecified external cause status: Secondary | ICD-10-CM | POA: Diagnosis not present

## 2018-10-10 DIAGNOSIS — S61215A Laceration without foreign body of left ring finger without damage to nail, initial encounter: Secondary | ICD-10-CM | POA: Insufficient documentation

## 2018-10-10 DIAGNOSIS — Y93G1 Activity, food preparation and clean up: Secondary | ICD-10-CM | POA: Diagnosis not present

## 2018-10-10 MED ORDER — TETANUS-DIPHTH-ACELL PERTUSSIS 5-2.5-18.5 LF-MCG/0.5 IM SUSP
0.5000 mL | Freq: Once | INTRAMUSCULAR | Status: DC
  Filled 2018-10-10: qty 0.5

## 2018-10-10 MED ORDER — LIDOCAINE-EPINEPHRINE (PF) 2 %-1:200000 IJ SOLN
20.0000 mL | Freq: Once | INTRAMUSCULAR | Status: AC
  Administered 2018-10-10: 20 mL via INTRADERMAL
  Filled 2018-10-10: qty 20

## 2018-10-10 NOTE — ED Triage Notes (Addendum)
Pt states she was cutting potatoes today and got her left ring finger. Unknown last tetanus. Small lac on tip.

## 2018-10-10 NOTE — ED Provider Notes (Signed)
[facility]-EMERGENCY DEPT Provider Note   CSN: [id number] Arrival date & time: 10/10/18  1750    History   Chief Complaint Chief Complaint  Patient presents with  . Extremity Laceration    HPI Casey Hamilton is a 33 y.o. female.     33 yo F with a chief complaint of finger laceration.  Happened about an hour ago.  Cutting potatoes.  Bleeding controlled. Unsure of Tdap.   The history is provided by the patient.  Illness  Severity:  Mild Onset quality:  Sudden Duration:  1 hour Timing:  Constant Progression:  Worsening Chronicity:  New Associated symptoms: no chest pain, no congestion, no fever, no headaches, no myalgias, no nausea, no rhinorrhea, no shortness of breath, no vomiting and no wheezing     Past Medical History:  Diagnosis Date  . Allergy   . Anemia   . Contact dermatitis   . Medical history non-contributory   . Prediabetes     Patient Active Problem List   Diagnosis Date Noted  . Allergy to amoxicillin 08/01/2018  . Low back pain 08/01/2018  . Pregnant 08/01/2018  . Vaginal discharge 08/01/2018  . Prediabetes 02/20/2017  . Atopic dermatitis 02/01/2016  . Other seasonal allergic rhinitis 02/01/2016  . Menorrhagia with regular cycle 02/01/2016  . S/P tubal ligation 12/05/2013  . Anemia 12/05/2013  . Rectal bleeding - consult to GI; neg eval 12/03/2013    Past Surgical History:  Procedure Laterality Date  . FOOT SURGERY Right   . TUBAL LIGATION Bilateral 12/04/2013   Procedure: POST PARTUM TUBAL LIGATION;  Surgeon: Kirkland Hun, MD;  Location: [facility];  Service: Gynecology;  Laterality: Bilateral;  . WISDOM TOOTH EXTRACTION       OB History    Gravida  3   Para  3   Term  3   Preterm      AB      Living  3     SAB      TAB      Ectopic      Multiple      Live Births  1            Home Medications    Prior to Admission medications   Medication Sig Start Date End Date Taking? Authorizing  Provider  diclofenac (VOLTAREN) 75 MG EC tablet Take 1 tablet (75 mg total) by mouth 2 (two) times daily. Patient not taking: Reported on 09/27/2018 08/01/18   Lenn Sink, DPM  diclofenac (VOLTAREN) 75 MG EC tablet Take 1 tablet (75 mg total) by mouth 2 (two) times daily. 09/24/18   Lenn Sink, DPM  gabapentin (NEURONTIN) 300 MG capsule Take 1 capsule (300 mg total) by mouth 3 (three) times daily. 08/01/18   Myles Lipps, MD    Family History Family History  Problem Relation Age of Onset  . Hypertension Mother   . COPD Maternal Grandfather   . Diabetes Maternal Grandfather   . Epilepsy Paternal Grandmother   . Breast cancer Maternal Aunt   . Prostate cancer Maternal Uncle     Social History Social History   Tobacco Use  . Smoking status: Never Smoker  . Smokeless tobacco: Never Used  Substance Use Topics  . Alcohol use: Yes    Comment: cocktail every few months  . Drug use: No     Allergies   Amoxicillin   Review of Systems Review of Systems  Constitutional: Negative for chills and fever.  HENT: Negative for congestion and rhinorrhea.   Eyes: Negative for redness and visual disturbance.  Respiratory: Negative for shortness of breath and wheezing.   Cardiovascular: Negative for chest pain and palpitations.  Gastrointestinal: Negative for nausea and vomiting.  Genitourinary: Negative for dysuria and urgency.  Musculoskeletal: Negative for arthralgias and myalgias.  Skin: Positive for wound. Negative for pallor.  Neurological: Negative for dizziness and headaches.     Physical Exam Updated Vital Signs BP (!) 131/98   Pulse 85   Temp 98.8 F (37.1 C) (Oral)   Resp 16   Ht  (1.702 m)   Wt 93 kg   LMP 09/14/2018   SpO2 99%   BMI 32.11 kg/m   Physical Exam Vitals signs and nursing note reviewed.  Constitutional:      General: She is not in acute distress.    Appearance: She is well-developed. She is not diaphoretic.  HENT:     Head:  Normocephalic and atraumatic.  Eyes:     Pupils: Pupils are equal, round, and reactive to light.  Neck:     Musculoskeletal: Normal range of motion and neck supple.  Cardiovascular:     Rate and Rhythm: Normal rate and regular rhythm.     Heart sounds: No murmur. No friction rub. No gallop.   Pulmonary:     Effort: Pulmonary effort is normal.     Breath sounds: No wheezing or rales.  Abdominal:     General: There is no distension.     Palpations: Abdomen is soft.     Tenderness: There is no abdominal tenderness.  Musculoskeletal:        General: No tenderness.     Comments: Laceration through the distal tip of the finger about midway through along the radial aspect.  Involves the distal aspect of the nail excluding the bed.  No foreign bodies no exposed bone.  Skin:    General: Skin is warm and dry.  Neurological:     Mental Status: She is alert and oriented to person, place, and time.  Psychiatric:        Behavior: Behavior normal.      ED Treatments / Results  Labs (all labs ordered are listed, but only abnormal results are displayed) Labs Reviewed - No data to display  EKG None  Radiology No results found.  Procedures .Marland KitchenLaceration Repair Date/Time: 10/10/2018 6:30 PM Performed by: Melene Plan, DO Authorized by: Melene Plan, DO   Consent:    Consent obtained:  Verbal   Consent given by:  Patient   Risks discussed:  Infection, pain, poor cosmetic result and poor wound healing   Alternatives discussed:  No treatment, delayed treatment and observation Anesthesia (see MAR for exact dosages):    Anesthesia method:  Nerve block   Block needle gauge:  27 G   Block anesthetic:  Lidocaine 2% WITH epi   Block technique:  Digital   Block injection procedure:  Anatomic landmarks identified and anatomic landmarks palpated   Block outcome:  Anesthesia achieved Laceration details:    Location:  Finger   Finger location:  L ring finger   Length (cm):  2.6 Repair type:     Repair type:  Simple Pre-procedure details:    Preparation:  Patient was prepped and draped in usual sterile fashion Exploration:    Hemostasis achieved with:  Direct pressure   Wound exploration: wound explored through full range of motion and entire depth of wound probed and visualized  Wound extent: no foreign bodies/material noted, no tendon damage noted and no vascular damage noted     Contaminated: no   Treatment:    Area cleansed with:  Soap and water   Amount of cleaning:  Extensive   Irrigation solution:  Tap water   Irrigation volume:  5 min   Visualized foreign bodies/material removed: no   Skin repair:    Repair method:  Sutures   Suture size:  4-0   Suture material:  Nylon   Suture technique:  Simple interrupted   Number of sutures:  3 Approximation:    Approximation:  Close Post-procedure details:    Dressing:  Antibiotic ointment and bulky dressing   Patient tolerance of procedure:  Tolerated well, no immediate complications   (including critical care time)  Medications Ordered in ED Medications  lidocaine-EPINEPHrine (XYLOCAINE W/EPI) 2 %-1:200000 (PF) injection 20 mL (has no administration in time range)  Tdap (BOOSTRIX) injection 0.5 mL (has no administration in time range)     Initial Impression / Assessment and Plan / ED Course  I have reviewed the triage vital signs and the nursing notes.  Pertinent labs & imaging results that were available during my care of the patient were reviewed by me and considered in my medical decision making (see chart for details).        33 yo F with a chief complaint of a finger laceration.  Sutured at bedside. Tdap updated, d/c home.   6:31 PM:  I have discussed the diagnosis/risks/treatment options with the patient and believe the pt to be eligible for discharge home to follow-up with PCP. We also discussed returning to the ED immediately if new or worsening sx occur. We discussed the sx which are most concerning  (e.g., sudden worsening pain, fever, inability to tolerate by mouth) that necessitate immediate return. Medications administered to the patient during their visit and any new prescriptions provided to the patient are listed below.  Medications given during this visit Medications  lidocaine-EPINEPHrine (XYLOCAINE W/EPI) 2 %-1:200000 (PF) injection 20 mL (has no administration in time range)  Tdap (BOOSTRIX) injection 0.5 mL (has no administration in time range)     The patient appears reasonably screen and/or stabilized for discharge and I doubt any other medical condition or other [facility] requiring further screening, evaluation, or treatment in the ED at this time prior to discharge.    Final Clinical Impressions(s) / ED Diagnoses   Final diagnoses:  Laceration of left ring finger without foreign body with damage to nail, initial encounter    ED Discharge Orders    None       Melene PlanFloyd, Kamilo Och, DO 10/10/18 1831

## 2018-10-10 NOTE — Discharge Instructions (Signed)
Return for redness drainage fever.  Keep clean and dry.  Can apply Vaseline bacitracin or Neosporin to the area if you like.  Follow-up with your PCP or urgent care or here in 7 to 10 days for suture removal.

## 2018-10-15 ENCOUNTER — Ambulatory Visit (INDEPENDENT_AMBULATORY_CARE_PROVIDER_SITE_OTHER): Payer: 59 | Admitting: Neurology

## 2018-10-15 ENCOUNTER — Other Ambulatory Visit: Payer: Self-pay

## 2018-10-15 ENCOUNTER — Ambulatory Visit: Payer: 59 | Admitting: Podiatry

## 2018-10-15 DIAGNOSIS — E669 Obesity, unspecified: Secondary | ICD-10-CM

## 2018-10-15 DIAGNOSIS — R0683 Snoring: Secondary | ICD-10-CM

## 2018-10-15 DIAGNOSIS — G4733 Obstructive sleep apnea (adult) (pediatric): Secondary | ICD-10-CM | POA: Diagnosis not present

## 2018-10-15 DIAGNOSIS — R519 Headache, unspecified: Secondary | ICD-10-CM

## 2018-10-15 DIAGNOSIS — R0681 Apnea, not elsewhere classified: Secondary | ICD-10-CM

## 2018-10-15 DIAGNOSIS — G4719 Other hypersomnia: Secondary | ICD-10-CM

## 2018-10-15 DIAGNOSIS — R351 Nocturia: Secondary | ICD-10-CM

## 2018-10-18 ENCOUNTER — Telehealth: Payer: Self-pay

## 2018-10-18 NOTE — Progress Notes (Signed)
Patient referred by Dr. Leretha Pol, seen virtually by me on 09/17/18, HST on 10/15/18.    Please call and notify the patient that the recent home sleep test showed obstructive sleep apnea in the moderate range. I recommend treatment for this in the form of autoPAP, as we are not yet bringing patients in for in-lab testing for CPAP titration studies, due to the virus pandemic. I would recommend starting an autoPAP machine at home, through a DME company (of patient's choice, or as per insurance requirement, as per in [employer], if there are such restrictions, depending on insurance carrier). The DME representative will educate the patient on how to use the machine, how to put the mask on, etc. I have placed an order in the chart. Please send referral, talk to patient, send report to referring MD. We will need a FU in sleep clinic for 10 weeks post-PAP set up, please arrange that with me or one of our NPs.  Also, please remind patient about the importance of compliance with PAP usage; this is an Barista, but good compliance also helps [place] track improvements in patient's sleep related complaints and objective improvements, such as BP and weight for example or nocturia or headaches, etc. For concerns and questions about how to clean the PAP machine and the supplies and how frequently to change the hose, mask and filters, etc., patient can call the DME company, for more information, education and troubleshooting. Especially in the current situation, I recommend, patients be extra mindful about hand hygiene, handling the PAP equipment only with clean hands, wipe the mask daily, keep little one and four-legged companions (and any other pets for that matter) away from the machine and mask at all times.    Thanks,   Huston Foley, MD, PhD [facility])

## 2018-10-18 NOTE — Progress Notes (Signed)
Home sleep test encounter.  

## 2018-10-18 NOTE — Telephone Encounter (Signed)
-----   Message from Huston Foley, MD sent at 10/18/2018  7:55 AM EDT ----- Patient referred by Dr. Leretha Pol, seen virtually by me on 09/17/18, HST on 10/15/18.    Please call and notify the patient that the recent home sleep test showed obstructive sleep apnea in the moderate range. I recommend treatment for this in the form of autoPAP, as we are not yet bringing patients in for in-lab testing for CPAP titration studies, due to the virus pandemic. I would recommend starting an autoPAP machine at home, through a DME company (of patient's choice, or as per insurance requirement, as per in [employer], if there are such restrictions, depending on insurance carrier). The DME representative will educate the patient on how to use the machine, how to put the mask on, etc. I have placed an order in the chart. Please send referral, talk to patient, send report to referring MD. We will need a FU in sleep clinic for 10 weeks post-PAP set up, please arrange that with me or one of our NPs.  Also, please remind patient about the importance of compliance with PAP usage; this is an [place]ista, but good compliance also helps [place] track improvements in patient's sleep related complaints and objective improvements, such as BP and weight for example or nocturia or headaches, etc. For concerns and questions about how to clean the PAP machine and the supplies and how frequently to change the hose, mask and filters, etc., patient can call the DME company, for more information, education and troubleshooting. Especially in the current situation, I recommend, patients be extra mindful about hand hygiene, handling the PAP equipment only with clean hands, wipe the mask daily, keep little one and four-legged companions (and any other pets for that matter) away from the machine and mask at all times.     Thanks,   Huston Foley, MD, PhD [facility])

## 2018-10-18 NOTE — Procedures (Signed)
Patient Information     First Name: Casey Boerlisia Last Name: Cliffton Hamilton ID: [id number]  Birth Date: Sep 13, 1985 Age: 33 Gender: Female  Referring Provider: Myles LippsSantiago, Irma M, MD BMI: 31.8 (W=203 lb, H=5' 7'')  Neck Circ.:  17 '' Epworth:  11/24   Sleep Study Information    Study Date: Oct 15, 2018 S/H/A Version: 5.1.76.4 / 4.1.1528 / 4176   History: Casey Hamilton is a 33 year old right-handed woman with an underlying medical history of dermatitis, prediabetes, anemia, allergies, and obesity, who reports snoring and sleep disruption.  Summary & Diagnosis:    OSA Recommendations:      This home sleep test demonstrates moderate obstructive sleep apnea with a total AHI of 21/hour and O2 nadir of 89%. Treatment with positive airway pressure (PAP) is recommended and given the milder desaturations overall, treatment can be achieved in the form of an autoPAP titration/trial at home. A proper overnight, lab-attended PAP titration study with CPAP may be helpful or needed down the road to optimize treatment, when considered safe (given the current COVID-19 pandemic). Please note, that untreated obstructive sleep apnea may carry additional perioperative morbidity. Patients with significant obstructive sleep apnea should receive perioperative PAP therapy and the surgeons and particularly the anesthesiologist should be informed of the diagnosis and the severity of the sleep disordered breathing. Patient will be reminded regarding compliance with the PAP machine and to be mindful of cleanliness with the equipment and timely with supply changes (i.e. changing filter, mask, hose, humidifier chamber on an ongoing basis, as recommended, and cleaning parts that touch the face and nose daily, etc). The patient should be cautioned not to drive, work at heights, or operate dangerous or heavy equipment when tired or sleepy. Review and reiteration of good sleep hygiene measures should be pursued with any patient. Other causes of  the patient's symptoms, including circadian rhythm disturbances, an underlying mood disorder, medication effect and/or an underlying medical problem cannot be ruled out based on this test. Clinical correlation is recommended. The patient and her referring provider will be notified of the test results. The patient [facility], either for a face-to-face or virtual visit, whichever feasible and recommended at the time.  I certify that I have reviewed the raw data recording prior to the issuance of this report in accordance with the standards of the American Academy of Sleep Medicine (AASM).  Huston FoleySaima Keyden Pavlov, MD, PhD [facility] Diplomat, ABPN (Neurology and Sleep)                Start Study Time: End Study Time: Total Recording Time:  10:10:47 PM      6:14:23 AM      8 h, 3 min  Total Sleep Time % REM of Sleep Time:  7 h, 13 min  31.3    Mean: 96 Minimum: 89 Maximum: 100  Mean of Desaturations Nadirs (%):   94  Oxygen Desaturation %: 4-9 10-20 >20 Total  Events Number Total  48 100.0  0 0.0  0 0.0  48 100.0  Oxygen Saturation: <90 <=88 <85 <80 <70  Duration (minutes): Sleep % 0.0 0.0 0.0 0.0 0.0 0.0 0.0 0.0 0.0 0.0     Respiratory Indices       Total Events REM NREM All Night  pRDI: pAHI: ODI:  153  150  48 41.2 39.8 14.9 12.6 12.6 3.0 21.4 21.0 6.7       Pulse  Rate Statistics during Sleep (BPM)      Mean: 81 Minimum: 41 Maximum: 111    Oxygen Saturation Statistics   Sleep Summary   Indices are calculated using technically valid sleep time of  7 hrs, 8 min. pRDI/pAHI are calculated using oxi desaturations ? 3%                   Sleep Stages Chart                                pAHI=21.0                                            Mild              Moderate                    Severe                                                 5              15                     30

## 2018-10-18 NOTE — Telephone Encounter (Signed)
I called pt. I advised pt that Dr. Frances Furbish reviewed their sleep study results and found that pt has moderate osa. Dr. Frances Furbish recommends that pt start an auto pap at home. I reviewed PAP compliance expectations with the pt. Pt is agreeable to starting an auto-PAP. I advised pt that an order will be sent to a DME, [facility], and [facility] will call the pt within about one week after they file with the pt's insurance. [facility] will show the pt how to use the machine, fit for masks, and troubleshoot the auto-PAP if needed. A follow up appt was made for insurance purposes with Aundra Millet, NP on 12/26/18 at 2:00pm. Pt verbalized understanding to arrive 15 minutes early and bring their auto-PAP. A letter with all of this information in it will be mailed to the pt as a reminder. I verified with the pt that the address we have on file is correct. Pt verbalized understanding of results. Pt had no questions at this time but was encouraged to call back if questions arise. I have sent the order to [facility] and have received confirmation that they have received the order.

## 2018-10-18 NOTE — Addendum Note (Signed)
Addended by: Huston Foley on: 10/18/2018 07:55 AM   Modules accepted: Orders

## 2018-10-30 ENCOUNTER — Ambulatory Visit (INDEPENDENT_AMBULATORY_CARE_PROVIDER_SITE_OTHER): Payer: 59 | Admitting: Family Medicine

## 2018-10-30 ENCOUNTER — Other Ambulatory Visit: Payer: Self-pay

## 2018-10-30 ENCOUNTER — Encounter: Payer: Self-pay | Admitting: Family Medicine

## 2018-10-30 ENCOUNTER — Telehealth: Payer: Self-pay | Admitting: Family Medicine

## 2018-10-30 VITALS — BP 110/74 | HR 91 | Temp 98.9°F | Resp 18 | Wt 210.0 lb

## 2018-10-30 DIAGNOSIS — Z4802 Encounter for removal of sutures: Secondary | ICD-10-CM | POA: Diagnosis not present

## 2018-10-30 NOTE — Progress Notes (Signed)
Acute Office Visit  Subjective:    Patient ID: Casey Hamilton, female    DOB: 1985-07-06, 33 y.o.   MRN: [id number]  Chief Complaint  Patient presents with  . Suture / Staple Removal    HPI Patient is in today for suture removal-no redness-pain noted intermittently when end of affected finger touched. No drainage. FROM  Past Medical History:  Diagnosis Date  . Allergy   . Anemia   . Contact dermatitis   . Medical history non-contributory   . Prediabetes     Past Surgical History:  Procedure Laterality Date  . FOOT SURGERY Right   . TUBAL LIGATION Bilateral 12/04/2013   Procedure: POST PARTUM TUBAL LIGATION;  Surgeon: Kirkland Hun, MD;  Location: [facility];  Service: Gynecology;  Laterality: Bilateral;  . WISDOM TOOTH EXTRACTION      Family History  Problem Relation Age of Onset  . Hypertension Mother   . COPD Maternal Grandfather   . Diabetes Maternal Grandfather   . Epilepsy Paternal Grandmother   . Breast cancer Maternal Aunt   . Prostate cancer Maternal Uncle     Social History   Socioeconomic History  . Marital status: Divorced    Spouse name: Not on file  . Number of children: 2  . Years of education: Not on file  . Highest education level: Not on file  Occupational History  . Not on file  Social Needs  . Financial resource strain: Not on file  . Food insecurity:    Worry: Not on file    Inability: Not on file  . Transportation needs:    Medical: Not on file    Non-medical: Not on file  Tobacco Use  . Smoking status: Never Smoker  . Smokeless tobacco: Never Used  Substance and Sexual Activity  . Alcohol use: Yes    Comment: cocktail every few months  . Drug use: No  . Sexual activity: Yes    Birth control/protection: Surgical    Comment: s/p tubal ligation  Lifestyle  . Physical activity:    Days per week: Not on file    Minutes per session: Not on file  . Stress: Not on file  Relationships  . Social connections:    Talks on phone:  Not on file    Gets together: Not on file    Attends religious service: Not on file    Active member of club or organization: Not on file    Attends meetings of clubs or organizations: Not on file    Relationship status: Not on file  . Intimate partner violence:    Fear of current or ex partner: Not on file    Emotionally abused: Not on file    Physically abused: Not on file    Forced sexual activity: Not on file  Other Topics Concern  . Not on file  Social History Narrative  . Not on file    Outpatient Medications Prior to Visit  Medication Sig Dispense Refill  . gabapentin (NEURONTIN) 300 MG capsule Take 1 capsule (300 mg total) by mouth 3 (three) times daily. 90 capsule 3  . diclofenac (VOLTAREN) 75 MG EC tablet Take 1 tablet (75 mg total) by mouth 2 (two) times daily. (Patient not taking: Reported on 09/27/2018) 50 tablet 2  . diclofenac (VOLTAREN) 75 MG EC tablet Take 1 tablet (75 mg total) by mouth 2 (two) times daily. 50 tablet 2   No facility-administered medications prior to visit.  Allergies  Allergen Reactions  . Amoxicillin Hives    ROS No fever No drainage from site of sutures Td-01/2014    Objective:    Physical Exam Left 4th finger-finger soaked in peroxide-using sterile instruments-sutures x3 removed. Steri strips placed with non stick bandage placed after application of bactroban. No eryth noted. Nail left in place.  BP 110/74   Pulse 91   Temp 98.9 F (37.2 C) (Oral)   Resp 18   Wt 210 lb (95.3 kg)   SpO2 97%   BMI 32.89 kg/m  Wt Readings from Last 3 Encounters:  10/30/18 210 lb (95.3 kg)  10/10/18 205 lb 0.4 oz (93 kg)  09/27/18 205 lb (93 kg)     Lab Results  Component Value Date   TSH 0.653 03/16/2018   Lab Results  Component Value Date   WBC 6.9 03/16/2018   HGB 12.5 03/16/2018   HCT 38.8 03/16/2018   MCV 82 03/16/2018   PLT 302 03/16/2018   Lab Results  Component Value Date   NA 141 08/01/2018   K 3.9 08/01/2018   CO2 23  08/01/2018   GLUCOSE 98 08/01/2018   BUN 10 08/01/2018   CREATININE 0.78 08/01/2018   BILITOT 0.7 08/01/2018   ALKPHOS 68 08/01/2018   AST 10 08/01/2018   ALT 8 08/01/2018   PROT 7.1 08/01/2018   ALBUMIN 4.3 08/01/2018   CALCIUM 9.4 08/01/2018   ANIONGAP 8 03/24/2016   GFR 116.64 02/20/2017   Lab Results  Component Value Date   CHOL 149 03/16/2018   Lab Results  Component Value Date   HDL 51 03/16/2018   Lab Results  Component Value Date   LDLCALC 85 03/16/2018   Lab Results  Component Value Date   TRIG 65 03/16/2018   Lab Results  Component Value Date   CHOLHDL 2.9 03/16/2018   Lab Results  Component Value Date   HGBA1C 6.2 (A) 08/01/2018      Assessment & Plan:   1. Visit for suture removal Skin care discussed with pt  Casey Schurman Mat CarneLEIGH Shanterica Biehler, MD

## 2018-10-30 NOTE — Patient Instructions (Signed)
° ° ° °  If you have lab work done today you will be contacted with your lab results within the next 2 weeks.  If you have not heard from us then please contact us. The fastest way to get your results is to register for My Chart. ° ° °IF you received an x-ray today, you will receive an invoice from [facility]. Please contact [facility] at [phone] with questions or concerns regarding your invoice.  ° °IF you received labwork today, you will receive an invoice from [facility]. Please contact [facility] at [phone] with questions or concerns regarding your invoice.  ° °Our billing staff will not be able to assist you with questions regarding bills from these companies. ° °You will be contacted with the lab results as soon as they are available. The fastest way to get your results is to activate your My Chart account. Instructions are located on the last page of this paperwork. If you have not heard from us regarding the results in 2 weeks, please contact this office. °  ° ° ° °

## 2018-10-30 NOTE — Telephone Encounter (Signed)
Called pt LVM for her to call v=back so we could try to get her scheduled for today   FR

## 2018-12-06 ENCOUNTER — Encounter: Payer: Self-pay | Admitting: Podiatry

## 2018-12-06 ENCOUNTER — Ambulatory Visit (INDEPENDENT_AMBULATORY_CARE_PROVIDER_SITE_OTHER): Payer: 59

## 2018-12-06 ENCOUNTER — Other Ambulatory Visit: Payer: Self-pay

## 2018-12-06 ENCOUNTER — Ambulatory Visit (INDEPENDENT_AMBULATORY_CARE_PROVIDER_SITE_OTHER): Payer: 59 | Admitting: Podiatry

## 2018-12-06 VITALS — Temp 98.7°F

## 2018-12-06 DIAGNOSIS — M7662 Achilles tendinitis, left leg: Secondary | ICD-10-CM | POA: Diagnosis not present

## 2018-12-06 DIAGNOSIS — M7661 Achilles tendinitis, right leg: Secondary | ICD-10-CM

## 2018-12-06 MED ORDER — NITROGLYCERIN 0.4 MG/HR TD PT24: 0.4000 mg | MEDICATED_PATCH | Freq: Every day | TRANSDERMAL | 1 refills | Status: DC

## 2018-12-06 MED ORDER — PREDNISONE 10 MG PO TABS: ORAL_TABLET | ORAL | 0 refills | Status: DC

## 2018-12-10 ENCOUNTER — Encounter: Payer: Self-pay | Admitting: Podiatry

## 2018-12-10 NOTE — Progress Notes (Signed)
Subjective:   Patient ID: Casey Hamilton, female   DOB: 33 y.o.   MRN: [id number]   HPI Patient presents stating she is having a lot of pain in the bottom in the back of the left heel and states that it is been ongoing.  Patient states that it is increasingly hard for her to be active and that she has trouble with walking especially with the left.   ROS      Objective:  Physical Exam  Neurovascular status intact with discomfort in the posterior and plantar aspect left at the insertional point of the tendon into the calcaneus     Assessment:  Acute Achilles tendinitis posterior plantar fascial tendinitis left over right     Plan:  H&P conditions reviewed and I went ahead today and I dispensed air fracture walker to completely immobilize and took stress off the plantar fascia.  I then went ahead and scanned for customized orthotics to wear good arch is up and lift the posterior heel and this was done by ped orthotist and she will see him when returned  X-rays indicate mild increase in the spur size with no indications of stress fracture and spur formation noted

## 2018-12-24 ENCOUNTER — Encounter: Payer: Self-pay | Admitting: Neurology

## 2018-12-26 ENCOUNTER — Ambulatory Visit (INDEPENDENT_AMBULATORY_CARE_PROVIDER_SITE_OTHER): Payer: 59 | Admitting: Adult Health

## 2018-12-26 ENCOUNTER — Other Ambulatory Visit: Payer: Self-pay

## 2018-12-26 ENCOUNTER — Encounter: Payer: Self-pay | Admitting: Adult Health

## 2018-12-26 VITALS — BP 118/72 | HR 94 | Temp 98.2°F | Ht 66.5 in | Wt 218.4 lb

## 2018-12-26 DIAGNOSIS — G4733 Obstructive sleep apnea (adult) (pediatric): Secondary | ICD-10-CM | POA: Diagnosis not present

## 2018-12-26 DIAGNOSIS — Z9989 Dependence on other enabling machines and devices: Secondary | ICD-10-CM | POA: Diagnosis not present

## 2018-12-26 NOTE — Patient Instructions (Signed)

## 2018-12-26 NOTE — Progress Notes (Signed)
Casey Hamilton: Casey Hamilton DOB: 01-17-1986  REASON FOR VISIT: follow up HISTORY FROM: Casey Hamilton  HISTORY OF PRESENT ILLNESS: Today 12/26/18:  Casey Hamilton is a 33 year old female with a history of obstructive sleep apnea on CPAP.  She returns today for follow-up.  Her download indicates that she use her machine 28 out of 30 days for compliance of 93%.  She is averaging greater than 4 hours 18 days for compliance of 60%.  On average she uses her machine 4 hours and 24 minutes.  Her residual AHI is 1.6 on 5 to 10 cm of water with EPR of 3.  Her leak in the 95th percentile is 12.8 L/min.  She reports that she still trying to get used to the CPAP.  She has noticed some benefit with her daytime sleepiness.  HISTORY (copied from Dr. Guadelupe Sabin note)  Casey Hamilton is a 33 year old right-handed woman with an underlying medical history of dermatitis, prediabetes, anemia, allergies, and obesity, with whom I am conducting a virtual, video based new Casey Hamilton visit via Webex in lieu of a face-to-face visit for evaluation of her sleep disorder, in particular, evaluation for obstructive sleep apnea. The Casey Hamilton is unaccompanied today and joins via cell phone. She is referred by her primary care physician, Dr. Grant Fontana, and I reviewed her note from 08/01/2018. Her Epworth sleepiness score is 11 out of 24, fatigue severely score is 30 out of 63.  Her most recent vital signs on file for me to review are from 08/01/2018: Blood pressure 113/67 with a pulse of 76, weight 202 for a BMI of 31.17. Of note, she presented to the emergency room on 07/11/2018 with flulike symptoms. I reviewed the emergency room records, temperature was 101.6 at the time.  She reports feeling better in that regard. She reports snoring and daytime somnolence, sleep disruption, occasional difficulty falling asleep. Bedtime around 11 and rise time around 6. She works as a Quarry manager at a [employer]. She works from 7 AM to 3 PM Mondays  through Fridays and every other weekend. She lives with her fianc and 3 daughters. She has been told by her fianc that she has pauses in her breathing while asleep. She denies a family history of OSA, has not had a tonsillectomy. She drinks caffeine in the form of soda, 2 per day on average, occasional alcohol, is a nonsmoker. They have no pets in the household. She does have a TV in the bedroom and limits on at night she has it on a music channel and it is on a sleep timer sometimes. She has woken up with a headache occasionally, describes it as dull and achy, no history of migraines. She has nocturia about once per average night.   REVIEW OF SYSTEMS: Out of a complete 14 system review of symptoms, the Casey Hamilton complains only of the following symptoms, and all other reviewed systems are negative.  Epworth sleepiness score 11, fatigue severity score 25  ALLERGIES: Allergies  Allergen Reactions  . Amoxicillin Hives    HOME MEDICATIONS: Outpatient Medications Prior to Visit  Medication Sig Dispense Refill  . gabapentin (NEURONTIN) 300 MG capsule Take 1 capsule (300 mg total) by mouth 3 (three) times daily. 90 capsule 3  . nitroGLYCERIN (NITRO-DUR) 0.4 mg/hr patch Place 1 patch (0.4 mg total) onto the skin daily. 30 patch 1  . predniSONE (DELTASONE) 10 MG tablet 12 day tapering dose 48 tablet 0   No facility-administered medications prior to visit.  PAST MEDICAL HISTORY: Past Medical History:  Diagnosis Date  . Allergy   . Anemia   . Contact dermatitis   . Medical history non-contributory   . Prediabetes     PAST SURGICAL HISTORY: Past Surgical History:  Procedure Laterality Date  . FOOT SURGERY Right   . TUBAL LIGATION Bilateral 12/04/2013   Procedure: POST PARTUM TUBAL LIGATION;  Surgeon: Kirkland HunArthur Stringer, MD;  Location: [facility];  Service: Gynecology;  Laterality: Bilateral;  . WISDOM TOOTH EXTRACTION      FAMILY HISTORY: Family History  Problem Relation Age of Onset  .  Hypertension Mother   . COPD Maternal Grandfather   . Diabetes Maternal Grandfather   . Epilepsy Paternal Grandmother   . Breast cancer Maternal Aunt   . Prostate cancer Maternal Uncle     SOCIAL HISTORY: Social History   Socioeconomic History  . Marital status: Divorced    Spouse name: Not on file  . Number of children: 2  . Years of education: Not on file  . Highest education level: Not on file  Occupational History  . Not on file  Social Needs  . Financial resource strain: Not on file  . Food insecurity    Worry: Not on file    Inability: Not on file  . Transportation needs    Medical: Not on file    Non-medical: Not on file  Tobacco Use  . Smoking status: Never Smoker  . Smokeless tobacco: Never Used  Substance and Sexual Activity  . Alcohol use: Yes    Comment: cocktail every few months  . Drug use: No  . Sexual activity: Yes    Birth control/protection: Surgical    Comment: s/p tubal ligation  Lifestyle  . Physical activity    Days per week: Not on file    Minutes per session: Not on file  . Stress: Not on file  Relationships  . Social Musicianconnections    Talks on phone: Not on file    Gets together: Not on file    Attends religious service: Not on file    Active member of club or organization: Not on file    Attends meetings of clubs or organizations: Not on file    Relationship status: Not on file  . Intimate partner violence    Fear of current or ex partner: Not on file    Emotionally abused: Not on file    Physically abused: Not on file    Forced sexual activity: Not on file  Other Topics Concern  . Not on file  Social History Narrative  . Not on file      PHYSICAL EXAM  Vitals:   12/26/18 1401  BP: 118/72  Pulse: 94  Temp: 98.2 F (36.8 C)  Weight: 218 lb 6.4 oz (99.1 kg)  Height: 5' 6.5" (1.689 m)   Body mass index is 34.72 kg/m.  Generalized: Well developed, in no acute distress  Chest: Lungs clear to auscultation bilaterally   Neurological examination  Mentation: Alert oriented to time, place, history taking. Follows all commands speech and language fluent Cranial nerve II-XII: Pupils were equal round reactive to light. Extraocular movements were full, visual field were full on confrontational test.. Head turning and shoulder shrug  were normal and symmetric. Motor: The motor testing reveals 5 over 5 strength of all 4 extremities. Good symmetric motor tone is noted throughout.  Sensory: Sensory testing is intact to soft touch on all 4 extremities. No evidence of extinction is noted.  Gait and station: Gait is normal Casey Hamilton has a boot on the left foot  DIAGNOSTIC DATA (LABS, IMAGING, TESTING) - I reviewed Casey Hamilton records, labs, notes, testing and imaging myself where available.  Lab Results  Component Value Date   WBC 6.9 03/16/2018   HGB 12.5 03/16/2018   HCT 38.8 03/16/2018   MCV 82 03/16/2018   PLT 302 03/16/2018      Component Value Date/Time   NA 141 08/01/2018 1027   K 3.9 08/01/2018 1027   CL 104 08/01/2018 1027   CO2 23 08/01/2018 1027   GLUCOSE 98 08/01/2018 1027   GLUCOSE 95 02/20/2017 1219   BUN 10 08/01/2018 1027   CREATININE 0.78 08/01/2018 1027   CALCIUM 9.4 08/01/2018 1027   PROT 7.1 08/01/2018 1027   ALBUMIN 4.3 08/01/2018 1027   AST 10 08/01/2018 1027   ALT 8 08/01/2018 1027   ALKPHOS 68 08/01/2018 1027   BILITOT 0.7 08/01/2018 1027   GFRNONAA 100 08/01/2018 1027   GFRAA 116 08/01/2018 1027   Lab Results  Component Value Date   CHOL 149 03/16/2018   HDL 51 03/16/2018   LDLCALC 85 03/16/2018   TRIG 65 03/16/2018   CHOLHDL 2.9 03/16/2018   Lab Results  Component Value Date   HGBA1C 6.2 (A) 08/01/2018   No results found for: VITAMINB12 Lab Results  Component Value Date   TSH 0.653 03/16/2018      ASSESSMENT AND PLAN 33 y.o. year old female  has a past medical history of Allergy, Anemia, Contact dermatitis, Medical history non-contributory, and Prediabetes. here  with:  1.  Obstructive sleep apnea on CPAP  Casey Hamilton CPAP download shows excellent compliance and good treatment of her apnea.  She is encouraged to continue using CPAP nightly and greater than 4 hours each night.  She is advised that if her symptoms worsen or she develops new symptoms she should let us know.  She will follow-up in 6 months or sooner if needed    I spent 15 minutes with the Casey Hamilton. 50% of this time was spent reviewing CPAP download   Butch PennyMegan Sula Fetterly, MSN, NP-C 12/26/2018, 2:08 PM [facility] [address], [address] [place], [place] [address] [phone]

## 2018-12-27 ENCOUNTER — Encounter: Payer: Self-pay | Admitting: Podiatry

## 2018-12-27 ENCOUNTER — Ambulatory Visit (INDEPENDENT_AMBULATORY_CARE_PROVIDER_SITE_OTHER): Payer: 59 | Admitting: Podiatry

## 2018-12-27 ENCOUNTER — Other Ambulatory Visit: Payer: Self-pay

## 2018-12-27 ENCOUNTER — Ambulatory Visit: Payer: 59 | Admitting: Orthotics

## 2018-12-27 VITALS — Temp 97.4°F

## 2018-12-27 DIAGNOSIS — L309 Dermatitis, unspecified: Secondary | ICD-10-CM

## 2018-12-27 DIAGNOSIS — M722 Plantar fascial fibromatosis: Secondary | ICD-10-CM

## 2018-12-27 DIAGNOSIS — M7662 Achilles tendinitis, left leg: Secondary | ICD-10-CM | POA: Diagnosis not present

## 2018-12-27 DIAGNOSIS — M7661 Achilles tendinitis, right leg: Secondary | ICD-10-CM

## 2018-12-27 MED ORDER — TERBINAFINE HCL 250 MG PO TABS: 250.0000 mg | ORAL_TABLET | Freq: Every day | ORAL | 0 refills | Status: DC

## 2018-12-27 NOTE — Patient Instructions (Signed)

## 2018-12-27 NOTE — Progress Notes (Signed)
Patient came in today to pick up custom made foot orthotics.  The goals were accomplished and the patient reported no dissatisfaction with said orthotics.  Patient was advised of breakin period and how to report any issues. 

## 2018-12-28 NOTE — Progress Notes (Signed)
Subjective:   Patient ID: Casey Hamilton, female   DOB: 33 y.o.   MRN: [id number]   HPI Patient presents stating she is improved with her left foot and has minimal discomfort currently but still has some pain and knows that she needs orthotics   ROS      Objective:  Physical Exam  Neurovascular status intact with discomfort left heel which is improved but present with inflammation fluid still noted upon deep palpation     Assessment:  Improvement fasciitis tendinitis-like condition with moderate discomfort still noted     Plan:  Advised on anti-inflammatories and continued reduced activities and went ahead today and orthotic casting done with instructions on usage and reappoint to recheck signed visit

## 2019-01-29 ENCOUNTER — Other Ambulatory Visit: Payer: Self-pay

## 2019-01-29 ENCOUNTER — Ambulatory Visit (INDEPENDENT_AMBULATORY_CARE_PROVIDER_SITE_OTHER): Payer: 59 | Admitting: Family Medicine

## 2019-01-29 ENCOUNTER — Encounter: Payer: Self-pay | Admitting: Family Medicine

## 2019-01-29 VITALS — BP 123/82 | HR 79 | Temp 98.7°F | Ht 66.5 in | Wt 218.0 lb

## 2019-01-29 DIAGNOSIS — Z23 Encounter for immunization: Secondary | ICD-10-CM | POA: Diagnosis not present

## 2019-01-29 DIAGNOSIS — R7303 Prediabetes: Secondary | ICD-10-CM

## 2019-01-29 NOTE — Patient Instructions (Addendum)
   If you have lab work done today you will be contacted with your lab results within the next 2 weeks.  If you have not heard from us then please contact us. The fastest way to get your results is to register for My Chart.   IF you received an x-ray today, you will receive an invoice from [facility]. Please contact [facility] at [phone] with questions or concerns regarding your invoice.   IF you received labwork today, you will receive an invoice from [facility]. Please contact [facility] at [phone] with questions or concerns regarding your invoice.   Our billing staff will not be able to assist you with questions regarding bills from these companies.  You will be contacted with the lab results as soon as they are available. The fastest way to get your results is to activate your My Chart account. Instructions are located on the last page of this paperwork. If you have not heard from us regarding the results in 2 weeks, please contact this office.     Prediabetes Eating Plan Prediabetes is a condition that causes blood sugar (glucose) levels to be higher than normal. This increases the risk for developing diabetes. In order to prevent diabetes from developing, your health care provider may recommend a diet and other lifestyle changes to help you:  Control your blood glucose levels.  Improve your cholesterol levels.  Manage your blood pressure. Your health care provider may recommend working with a diet and nutrition specialist (dietitian) to make a meal plan that is best for you. What are tips for following this plan? Lifestyle  Set weight loss goals with the help of your health care team. It is recommended that most people with prediabetes lose 7% of their current body weight.  Exercise for at least 30 minutes at least 5 days a week.  Attend a support group or seek ongoing support from a mental health counselor.  Take over-the-counter and prescription  medicines only as told by your health care provider. Reading food labels  Read food labels to check the amount of fat, salt (sodium), and sugar in prepackaged foods. Avoid foods that have: ? Saturated fats. ? Trans fats. ? Added sugars.  Avoid foods that have more than 300 milligrams (mg) of sodium per serving. Limit your daily sodium intake to less than 2,300 mg each day. Shopping  Avoid buying pre-made and processed foods. Cooking  Cook with olive oil. Do not use butter, lard, or ghee.  Bake, broil, grill, or boil foods. Avoid frying. Meal planning   Work with your dietitian to develop an eating plan that is right for you. This may include: ? Tracking how many calories you take in. Use a food diary, notebook, or mobile application to track what you eat at each meal. ? Using the glycemic index (GI) to plan your meals. The index tells you how quickly a food will raise your blood glucose. Choose low-GI foods. These foods take a longer time to raise blood glucose.  Consider following a Mediterranean diet. This diet includes: ? Several servings each day of fresh fruits and vegetables. ? Eating fish at least twice a week. ? Several servings each day of whole grains, beans, nuts, and seeds. ? Using olive oil instead of other fats. ? Moderate alcohol consumption. ? Eating small amounts of red meat and whole-fat dairy.  If you have high blood pressure, you may need to limit your sodium intake or follow a diet such as the   DASH eating plan. DASH is an eating plan that aims to lower high blood pressure. What foods are recommended? The items listed below may not be a complete list. Talk with your dietitian about what dietary choices are best for you. Grains Whole grains, such as whole-wheat or whole-grain breads, crackers, cereals, and pasta. Unsweetened oatmeal. Bulgur. Barley. Quinoa. Brown rice. Corn or whole-wheat flour tortillas or taco shells. Vegetables Lettuce. Spinach. Peas.  Beets. Cauliflower. Cabbage. Broccoli. Carrots. Tomatoes. Squash. Eggplant. Herbs. Peppers. Onions. Cucumbers. Brussels sprouts. Fruits Berries. Bananas. Apples. Oranges. Grapes. Papaya. Mango. Pomegranate. Kiwi. Grapefruit. Cherries. Meats and other protein foods Seafood. Poultry without skin. Lean cuts of pork and beef. Tofu. Eggs. Nuts. Beans. Dairy Low-fat or fat-free dairy products, such as yogurt, cottage cheese, and cheese. Beverages Water. Tea. Coffee. Sugar-free or diet soda. Seltzer water. Lowfat or no-fat milk. Milk alternatives, such as soy or almond milk. Fats and oils Olive oil. Canola oil. Sunflower oil. Grapeseed oil. Avocado. Walnuts. Sweets and desserts Sugar-free or low-fat pudding. Sugar-free or low-fat ice cream and other frozen treats. Seasoning and other foods Herbs. Sodium-free spices. Mustard. Relish. Low-fat, low-sugar ketchup. Low-fat, low-sugar barbecue sauce. Low-fat or fat-free mayonnaise. What foods are not recommended? The items listed below may not be a complete list. Talk with your dietitian about what dietary choices are best for you. Grains Refined white flour and flour products, such as bread, pasta, snack foods, and cereals. Vegetables Canned vegetables. Frozen vegetables with butter or cream sauce. Fruits Fruits canned with syrup. Meats and other protein foods Fatty cuts of meat. Poultry with skin. Breaded or fried meat. Processed meats. Dairy Full-fat yogurt, cheese, or milk. Beverages Sweetened drinks, such as sweet iced tea and soda. Fats and oils Butter. Lard. Ghee. Sweets and desserts Baked goods, such as cake, cupcakes, pastries, cookies, and cheesecake. Seasoning and other foods Spice mixes with added salt. Ketchup. Barbecue sauce. Mayonnaise. Summary  To prevent diabetes from developing, you may need to make diet and other lifestyle changes to help control blood sugar, improve cholesterol levels, and manage your blood  pressure.  Set weight loss goals with the help of your health care team. It is recommended that most people with prediabetes lose 7 percent of their current body weight.  Consider following a Mediterranean diet that includes plenty of fresh fruits and vegetables, whole grains, beans, nuts, seeds, fish, lean meat, low-fat dairy, and healthy oils. This information is not intended to replace advice given to you by your health care provider. Make sure you discuss any questions you have with your health care provider. Document Released: 10/07/2014 Document Revised: 09/14/2018 Document Reviewed: 07/27/2016 Elsevier Patient Education  2020 [employer]  

## 2019-01-29 NOTE — Progress Notes (Signed)
8/25/20208:45 AM  Casey [facility] 01/25/1986, 33 y.o., female [id number]  Chief Complaint  Patient presents with  . Follow-up    prediabetes follow up, went to podiatry and had sleep study. sleeping witth cpap for the most part    HPI:   Patient is a 33 y.o. female with past medical history significant for prediabetes, OSA on cpap who presents today for routine followup  Last OV Feb 2020 Last sleep appt July 2020 - tolerating cpap, sleeping better, waking up rested Has been seeing podiatry for achilles tendonitis and plantar fascitis, has completed steroid injections > 3 months ago, using orthotics and taking gabapentin which help Requesting flu vaccine today Has no acute concerns today  Wt Readings from Last 3 Encounters:  01/29/19 218 lb (98.9 kg)  12/26/18 218 lb 6.4 oz (99.1 kg)  10/30/18 210 lb (95.3 kg)   Lab Results  Component Value Date   HGBA1C 6.2 (A) 08/01/2018    Depression screen [facility] 2/9 01/29/2019 10/30/2018 08/01/2018  Decreased Interest 0 0 0  Down, Depressed, Hopeless 0 0 0  PHQ - 2 Score 0 0 0    Fall Risk  01/29/2019 10/30/2018 10/30/2018 08/01/2018 03/16/2018  Falls in the past year? 0 0 0 0 No  Number falls in past yr: 0 0 - 0 -  Injury with Fall? 0 0 0 0 -     Allergies  Allergen Reactions  . Amoxicillin Hives    Prior to Admission medications   Medication Sig Start Date End Date Taking? Authorizing Provider  gabapentin (NEURONTIN) 300 MG capsule Take 1 capsule (300 mg total) by mouth 3 (three) times daily. 08/01/18  Yes Rutherford Guys, MD  nitroGLYCERIN (NITRO-DUR) 0.4 mg/hr patch Place 1 patch (0.4 mg total) onto the skin daily. 12/06/18  Yes Regal, Tamala Fothergill, DPM  terbinafine (LAMISIL) 250 MG tablet Take 1 tablet (250 mg total) by mouth daily. 12/27/18  Yes RegalTamala Fothergill, DPM    Past Medical History:  Diagnosis Date  . Allergy   . Anemia   . Contact dermatitis   . Medical history non-contributory   . Prediabetes     Past Surgical  History:  Procedure Laterality Date  . FOOT SURGERY Right   . TUBAL LIGATION Bilateral 12/04/2013   Procedure: POST PARTUM TUBAL LIGATION;  Surgeon: Ena Dawley, MD;  Location: [facility];  Service: Gynecology;  Laterality: Bilateral;  . WISDOM TOOTH EXTRACTION      Social History   Tobacco Use  . Smoking status: Never Smoker  . Smokeless tobacco: Never Used  Substance Use Topics  . Alcohol use: Yes    Comment: cocktail every few months    Family History  Problem Relation Age of Onset  . Hypertension Mother   . COPD Maternal Grandfather   . Diabetes Maternal Grandfather   . Epilepsy Paternal Grandmother   . Breast cancer Maternal Aunt   . Prostate cancer Maternal Uncle     Review of Systems  Constitutional: Negative for chills and fever.  Respiratory: Negative for cough and shortness of breath.   Cardiovascular: Negative for chest pain, palpitations and leg swelling.  Gastrointestinal: Negative for abdominal pain, nausea and vomiting.     OBJECTIVE:  Today's Vitals   01/29/19 0836  BP: 123/82  Pulse: 79  Temp: 98.7 F (37.1 C)  SpO2: 98%  Weight: 218 lb (98.9 kg)  Height: 5' 6.5" (1.689 m)   Body mass index is 34.66 kg/m.   Physical Exam Vitals  signs and nursing note reviewed.  Constitutional:      Appearance: She is well-developed.  HENT:     Head: Normocephalic and atraumatic.     Mouth/Throat:     Pharynx: No oropharyngeal exudate.  Eyes:     General: No scleral icterus.    Conjunctiva/sclera: Conjunctivae normal.     Pupils: Pupils are equal, round, and reactive to light.  Neck:     Musculoskeletal: Neck supple.  Cardiovascular:     Rate and Rhythm: Normal rate and regular rhythm.     Heart sounds: Normal heart sounds. No murmur. No friction rub. No gallop.   Pulmonary:     Effort: Pulmonary effort is normal.     Breath sounds: Normal breath sounds. No wheezing or rales.  Skin:    General: Skin is warm and dry.  Neurological:     Mental  Status: She is alert and oriented to person, place, and time.     No results found for this or any previous visit (from the past 24 hour(s)).  No results found.   ASSESSMENT and PLAN  1. Prediabetes Labs pending. Consider starting metformin. Discussed recent weight gain. Discussed LFM. - Comprehensive metabolic panel - Hemoglobin A1c  2. Need for influenza vaccination - Flu Vaccine QUAD 36+ mos IM  Return in about 6 months (around 08/01/2019).    Myles LippsIrma M Santiago, MD Primary Care at [facility] [address] [place], [place] [address] Ph.  [phone] Fax [phone]

## 2019-01-30 LAB — COMPREHENSIVE METABOLIC PANEL
ALT: 14 IU/L (ref 0–32)
AST: 13 IU/L (ref 0–40)
Albumin/Globulin Ratio: 1.3 (ref 1.2–2.2)
Albumin: 4 g/dL (ref 3.8–4.8)
Alkaline Phosphatase: 68 IU/L (ref 39–117)
BUN/Creatinine Ratio: 13 (ref 9–23)
BUN: 10 mg/dL (ref 6–20)
Bilirubin Total: 0.4 mg/dL (ref 0.0–1.2)
CO2: 21 mmol/L (ref 20–29)
Calcium: 9.1 mg/dL (ref 8.7–10.2)
Chloride: 103 mmol/L (ref 96–106)
Creatinine, Ser: 0.76 mg/dL (ref 0.57–1.00)
GFR calc Af Amer: 118 mL/min/{1.73_m2} (ref >59)
GFR calc non Af Amer: 103 mL/min/{1.73_m2} (ref >59)
Globulin, Total: 3 g/dL (ref 1.5–4.5)
Glucose: 99 mg/dL (ref 65–99)
Potassium: 4.3 mmol/L (ref 3.5–5.2)
Sodium: 140 mmol/L (ref 134–144)
Total Protein: 7 g/dL (ref 6.0–8.5)

## 2019-01-30 LAB — HEMOGLOBIN A1C
Est. average glucose Bld gHb Est-mCnc: 128 mg/dL
Hgb A1c MFr Bld: 6.1 % — ABNORMAL HIGH (ref 4.8–5.6)

## 2019-03-15 ENCOUNTER — Encounter: Payer: Self-pay | Admitting: Podiatry

## 2019-03-15 ENCOUNTER — Ambulatory Visit (INDEPENDENT_AMBULATORY_CARE_PROVIDER_SITE_OTHER): Payer: 59

## 2019-03-15 ENCOUNTER — Ambulatory Visit (INDEPENDENT_AMBULATORY_CARE_PROVIDER_SITE_OTHER): Payer: 59 | Admitting: Podiatry

## 2019-03-15 ENCOUNTER — Telehealth: Payer: Self-pay | Admitting: *Deleted

## 2019-03-15 ENCOUNTER — Other Ambulatory Visit: Payer: Self-pay

## 2019-03-15 DIAGNOSIS — M7662 Achilles tendinitis, left leg: Secondary | ICD-10-CM

## 2019-03-15 DIAGNOSIS — M7661 Achilles tendinitis, right leg: Secondary | ICD-10-CM | POA: Diagnosis not present

## 2019-03-15 DIAGNOSIS — M216X2 Other acquired deformities of left foot: Secondary | ICD-10-CM

## 2019-03-15 NOTE — Telephone Encounter (Signed)
I am calling you in regard to your surgery date.  The date that was given to you is not available.  Dr. Paulla Dolly can do it on April 09, 2019.  Is that date okay for you?  "Yes, it should.  I just have to call me mom and see if she can take off that day."  I'll go ahead and schedule it for that day.  If you have any problems with that date, just call and let me know.

## 2019-03-15 NOTE — Patient Instructions (Signed)
Pre-Operative Instructions  Congratulations, you have decided to take an important step towards improving your quality of life.  You can be assured that the doctors and staff at [facility] will be with you every step of the way.  Here are some important things you should know:  1. Plan to be at the surgery center/hospital at least 1 (one) hour prior to your scheduled time, unless otherwise directed by the surgical center/hospital staff.  You must have a responsible adult accompany you, remain during the surgery and drive you home.  Make sure you have directions to the surgical center/hospital to ensure you arrive on time. 2. If you are having surgery at [facility] or [facility], you will need a copy of your medical history and physical form from your family physician within one month prior to the date of surgery. We will give you a form for your primary physician to complete.  3. We make every effort to accommodate the date you request for surgery.  However, there are times where surgery dates or times have to be moved.  We will contact you as soon as possible if a change in schedule is required.   4. No aspirin/ibuprofen for one week before surgery.  If you are on aspirin, any non-steroidal anti-inflammatory medications (Mobic, Aleve, Ibuprofen) should not be taken seven (7) days prior to your surgery.  You make take Tylenol for pain prior to surgery.  5. Medications - If you are taking daily heart and blood pressure medications, seizure, reflux, allergy, asthma, anxiety, pain or diabetes medications, make sure you notify the surgery center/hospital before the day of surgery so they can tell you which medications you should take or avoid the day of surgery. 6. No food or drink after midnight the night before surgery unless directed otherwise by surgical center/hospital staff. 7. No alcoholic beverages 24-hours prior to surgery.  No smoking 24-hours prior or 24-hours after  surgery. 8. Wear loose pants or shorts. They should be loose enough to fit over bandages, boots, and casts. 9. Don't wear slip-on shoes. Sneakers are preferred. 10. Bring your boot with you to the surgery center/hospital.  Also bring crutches or a walker if your physician has prescribed it for you.  If you do not have this equipment, it will be provided for you after surgery. 11. If you have not been contacted by the surgery center/hospital by the day before your surgery, call to confirm the date and time of your surgery. 12. Leave-time from work may vary depending on the type of surgery you have.  Appropriate arrangements should be made prior to surgery with your employer. 13. Prescriptions will be provided immediately following surgery by your doctor.  Fill these as soon as possible after surgery and take the medication as directed. Pain medications will not be refilled on weekends and must be approved by the doctor. 14. Remove nail polish on the operative foot and avoid getting pedicures prior to surgery. 15. Wash the night before surgery.  The night before surgery wash the foot and leg well with water and the antibacterial soap provided. Be sure to pay special attention to beneath the toenails and in between the toes.  Wash for at least three (3) minutes. Rinse thoroughly with water and dry well with a towel.  Perform this wash unless told not to do so by your physician.  Enclosed: 1 Ice pack (please put in freezer the night before surgery)   1 Hibiclens skin cleaner     Pre-op instructions  If you have any questions regarding the instructions, please do not hesitate to call our office.  [place]: [address], , [place] [address] -- [phone]  [place]: [address] [place], [place] [address] -- [phone]  [place]: [address]  [place],  [address] -- [phone]   Website: [email or web address] 

## 2019-03-17 NOTE — Progress Notes (Signed)
Subjective:   Patient ID: Casey [facility], female   DOB: 33 y.o.   MRN: [id number]   HPI Patient states my feet are absolutely killing me and I am simply not able to be active and it is been going on for several years in the bottom of my heel and it is the left is worse than the right and becoming more more an ongoing issue   ROS      Objective:  Physical Exam  Neurovascular status intact with patient noted to have exquisite discomfort in the plantar aspect of the left over right heel and moderate discomfort in the posterior heel at the insertion bilateral.  Patient is also noted to have exquisite pain and is noted to have equinus condition bilateral     Assessment:  Long-term chronic plantar fasciitis which continues to get worse despite conservative care Achilles tendinitis which is hopefully compensatory with equinus that is part of both conditions     Plan:  H&P all conditions reviewed and discussed and I recommended surgical intervention.  Patient wants surgery and I recommended endoscopic release of the fascia in conjunction with gastroc lengthening left and explained at great deal ultimately she could require posterior heel surgery.  Patient wants this done and after extensive review signed consent form and is given all preoperative instructions and is scheduled for outpatient surgery and I did give her a longer boot to wear as the short 1 will not be good due to the fact redoing the gastroc lengthening.  Patient understands everything total recovery can take 6 months to 1 year with [place] no long-term guarantees and she is scheduled for outpatient surgery  X-rays indicate small spurring plantar posterior with no indication of stress fracture

## 2019-03-26 ENCOUNTER — Telehealth: Payer: Self-pay | Admitting: Podiatry

## 2019-03-26 NOTE — Telephone Encounter (Signed)
DOS: 04/09/2019 SURGICAL PROCEDURES: (EPF) Endoscopic Plantar Fasciotomy Med Band Left, Gasgrocnemius Recess Left CPT CODES: 80998, 33825 DX CODES: M72.2, M21.6X9(Gastroc Equinus Deformity)  Dr. Posey Pronto assisting in surgery  NOTIFICATION/PRIOR AUTHORIZATION NUMBER [id number]   CASE STATUS CASE STATUS REASON  Closed                         Case Was Managed And Is Now Complete   ADVANCE NOTIFY DATE/TIME  03/19/2019 03:30 PM CDT  COVERAGE STATUS OVERALL COVERAGE STATUS Covered/Approved 1-2 CODE DESCRIPTION COVERAGE STATUS DECISION DATE [facility] Coverage determination is reflected for the facility admission and is not a guarantee of payment for ongoing services.Covered/Approved 03/25/2019  1 [id number] Gastrocnemius recession (eg, [place] pro more Covered/Approved 03/25/2019  2 [id number] Endoscopic plantar fasciotomy Covered/Approved 03/25/2019

## 2019-03-29 DIAGNOSIS — M79676 Pain in unspecified toe(s): Secondary | ICD-10-CM

## 2019-04-07 HISTORY — PX: FOOT SURGERY: SHX648

## 2019-04-09 ENCOUNTER — Telehealth: Payer: Self-pay | Admitting: *Deleted

## 2019-04-09 ENCOUNTER — Encounter: Payer: Self-pay | Admitting: Podiatry

## 2019-04-09 DIAGNOSIS — M722 Plantar fascial fibromatosis: Secondary | ICD-10-CM

## 2019-04-09 DIAGNOSIS — M216X2 Other acquired deformities of left foot: Secondary | ICD-10-CM

## 2019-04-09 DIAGNOSIS — M7661 Achilles tendinitis, right leg: Secondary | ICD-10-CM

## 2019-04-09 DIAGNOSIS — M7662 Achilles tendinitis, left leg: Secondary | ICD-10-CM

## 2019-04-09 NOTE — Telephone Encounter (Signed)
Faxed orders for crutches to [employer] and emailed to M. Stenson, A. Catron.

## 2019-04-09 NOTE — Telephone Encounter (Signed)
Pt states she had surgery today and Dr. Paulla Dolly told her she would be able to bear weight, but that is not the case at this time and she would like to know how to get crutches.

## 2019-04-12 ENCOUNTER — Telehealth: Payer: Self-pay | Admitting: *Deleted

## 2019-04-12 NOTE — Telephone Encounter (Signed)
Left a voicemail message for patient at [phone] (cell #) to check to see how they were doing from when they had surgery with Dr. Paulla Dolly on Tuesday, April 09, 2019.  DOS 04/09/19 EPF LT Gastroc Equinus Lengthening LT  Patient's next appointment will be on:  Monday, April 15, 2019 11:15 am with Dr. Paulla Dolly

## 2019-04-15 ENCOUNTER — Ambulatory Visit (INDEPENDENT_AMBULATORY_CARE_PROVIDER_SITE_OTHER): Payer: Self-pay | Admitting: Podiatry

## 2019-04-15 ENCOUNTER — Encounter: Payer: Self-pay | Admitting: Podiatry

## 2019-04-15 ENCOUNTER — Other Ambulatory Visit: Payer: Self-pay

## 2019-04-15 DIAGNOSIS — M722 Plantar fascial fibromatosis: Secondary | ICD-10-CM

## 2019-04-15 DIAGNOSIS — M7662 Achilles tendinitis, left leg: Secondary | ICD-10-CM

## 2019-04-15 DIAGNOSIS — M7661 Achilles tendinitis, right leg: Secondary | ICD-10-CM

## 2019-04-18 NOTE — Progress Notes (Signed)
Subjective:   Patient ID: Casey [facility], female   DOB: 33 y.o.   MRN: [id number]   HPI Patient states doing really well with surgery and very pleased with how I am doing   ROS      Objective:  Physical Exam  Neurovascular status intact with incision sites plantar heel and calf muscle healing well with wound edges well coapted and negative Bevelyn Buckles' sign noted     Assessment:  Doing well post endoscopic surgery gastroc lengthening left     Plan:  Sterile dressing reapplied continue with boot usage immobilization and elevation.  Reappoint 2 weeks suture removal or earlier if needed

## 2019-04-29 ENCOUNTER — Encounter: Payer: Self-pay | Admitting: Podiatry

## 2019-04-29 ENCOUNTER — Other Ambulatory Visit: Payer: Self-pay

## 2019-04-29 ENCOUNTER — Ambulatory Visit (INDEPENDENT_AMBULATORY_CARE_PROVIDER_SITE_OTHER): Payer: 59 | Admitting: Podiatry

## 2019-04-29 DIAGNOSIS — M722 Plantar fascial fibromatosis: Secondary | ICD-10-CM | POA: Diagnosis not present

## 2019-04-30 NOTE — Progress Notes (Signed)
Subjective:   Patient ID: Casey [facility], female   DOB: 33 y.o.   MRN: [id number]   HPI Patient states doing well and states minimal discomfort and so far very pleased   ROS      Objective:  Physical Exam  Neurovascular status intact negative Bevelyn Buckles' sign noted wound edges well coapted back of left calf along with heel     Assessment:  Overall doing well endoscopic release gastroc lengthening     Plan:  Reviewed condition and recommended the continuation of conservative care and stitches removed wound edges coapted well and begin gradual increase in activity over the next few weeks but still be very careful and compression will be continued along with elevation and reappoint to recheck

## 2019-05-23 ENCOUNTER — Ambulatory Visit: Payer: 59 | Admitting: Podiatry

## 2019-06-13 ENCOUNTER — Other Ambulatory Visit: Payer: Self-pay

## 2019-06-13 ENCOUNTER — Encounter: Payer: Self-pay | Admitting: Podiatry

## 2019-06-13 ENCOUNTER — Ambulatory Visit (INDEPENDENT_AMBULATORY_CARE_PROVIDER_SITE_OTHER): Payer: 59 | Admitting: Podiatry

## 2019-06-13 DIAGNOSIS — M7672 Peroneal tendinitis, left leg: Secondary | ICD-10-CM

## 2019-06-13 DIAGNOSIS — M722 Plantar fascial fibromatosis: Secondary | ICD-10-CM

## 2019-06-14 NOTE — Progress Notes (Signed)
Subjective:   Patient ID: Casey [facility], female   DOB: 34 y.o.   MRN: [id number]   HPI Patient states doing well with still some swelling but overall improving with some pain in the outside of the foot   ROS      Objective:  Physical Exam  Neurovascular status intact negative Denna Haggard' sign noted with incisions on the posterior heel and plantar heel doing well with minimal plantar pain noted with moderate discomfort in the peroneal tendinitis left      Assessment:  Peroneal tendinitis left which may be compensatory for the previous procedure or separate in its own individual problem     Plan:  H&P condition reviewed and I discussed treatment options for the outside and we can adjust go ahead and utilize ice anti-inflammatories currently and may require further more aggressive treatment.  Patient is discharged from plantar heel currently

## 2019-07-04 ENCOUNTER — Ambulatory Visit: Payer: 59 | Admitting: Adult Health

## 2019-08-01 ENCOUNTER — Ambulatory Visit: Payer: 59 | Admitting: Family Medicine

## 2019-08-12 ENCOUNTER — Other Ambulatory Visit: Payer: Self-pay

## 2019-08-12 ENCOUNTER — Ambulatory Visit (INDEPENDENT_AMBULATORY_CARE_PROVIDER_SITE_OTHER): Payer: 59 | Admitting: Family Medicine

## 2019-08-12 ENCOUNTER — Encounter: Payer: Self-pay | Admitting: Family Medicine

## 2019-08-12 VITALS — BP 130/88 | HR 80 | Temp 98.4°F | Ht 66.5 in | Wt 210.0 lb

## 2019-08-12 DIAGNOSIS — R7303 Prediabetes: Secondary | ICD-10-CM

## 2019-08-12 DIAGNOSIS — R200 Anesthesia of skin: Secondary | ICD-10-CM

## 2019-08-12 DIAGNOSIS — R208 Other disturbances of skin sensation: Secondary | ICD-10-CM

## 2019-08-12 DIAGNOSIS — Z9989 Dependence on other enabling machines and devices: Secondary | ICD-10-CM

## 2019-08-12 DIAGNOSIS — G4733 Obstructive sleep apnea (adult) (pediatric): Secondary | ICD-10-CM | POA: Insufficient documentation

## 2019-08-12 NOTE — Patient Instructions (Signed)
° ° ° °  If you have lab work done today you will be contacted with your lab results within the next 2 weeks.  If you have not heard from us then please contact us. The fastest way to get your results is to register for My Chart. ° ° °IF you received an x-ray today, you will receive an invoice from [facility]. Please contact [facility] at [phone] with questions or concerns regarding your invoice.  ° °IF you received labwork today, you will receive an invoice from [facility]. Please contact [facility] at [phone] with questions or concerns regarding your invoice.  ° °Our billing staff will not be able to assist you with questions regarding bills from these companies. ° °You will be contacted with the lab results as soon as they are available. The fastest way to get your results is to activate your My Chart account. Instructions are located on the last page of this paperwork. If you have not heard from us regarding the results in 2 weeks, please contact this office. °  ° ° ° °

## 2019-08-12 NOTE — Progress Notes (Signed)
3/8/202112:02 PM  Casey Hamilton 1985-07-16, 34 y.o., female [id number]  Chief Complaint  Patient presents with  . prediabetes  . left foot numbness    hard spot on calf/ hx of foot surg 04/2019    HPI:   Patient is a 34 y.o. female with past medical history significant for prediabetes, OSA on cpap who presents today for routine followup  Last OV aug 2020 Sees podiatry - Endoscopic PLantar Fasciotomy LT; Gastroc Equinus Lengthening LT, nov 2020, last OV Jan 2021, reports that gabapentin is helping much, having numbness along lateral foot and swelling but not pain at incision site  Uses cpap every night  Cont to work on [employer]  Lab Results  Component Value Date   HGBA1C 6.1 (H) 01/29/2019   HGBA1C 6.2 (A) 08/01/2018   HGBA1C 6.2 (H) 01/26/2018   Lab Results  Component Value Date   LDLCALC 85 03/16/2018   CREATININE 0.76 01/29/2019   Wt Readings from Last 3 Encounters:  08/12/19 210 lb (95.3 kg)  01/29/19 218 lb (98.9 kg)  12/26/18 218 lb 6.4 oz (99.1 kg)   BP Readings from Last 3 Encounters:  08/12/19 130/88  01/29/19 123/82  12/26/18 118/72    Depression screen PHQ 2/9 08/12/2019 01/29/2019 10/30/2018  Decreased Interest 0 0 0  Down, Depressed, Hopeless 0 0 0  PHQ - 2 Score 0 0 0    Fall Risk  08/12/2019 01/29/2019 10/30/2018 10/30/2018 08/01/2018  Falls in the past year? 0 0 0 0 0  Number falls in past yr: 0 0 0 - 0  Injury with Fall? 0 0 0 0 0  Follow up Falls evaluation completed - - - -     Allergies  Allergen Reactions  . Amoxicillin Hives    Prior to Admission medications   Medication Sig Start Date End Date Taking? Authorizing Provider  gabapentin (NEURONTIN) 300 MG capsule Take 1 capsule (300 mg total) by mouth 3 (three) times daily. 08/01/18  Yes Rutherford Guys, MD    Past Medical History:  Diagnosis Date  . Allergy   . Anemia   . Contact dermatitis   . Medical history non-contributory   . Prediabetes     Past Surgical History:    Procedure Laterality Date  . FOOT SURGERY Right   . FOOT SURGERY Left 04/2019   heel spur and achiles release  . TUBAL LIGATION Bilateral 12/04/2013   Procedure: POST PARTUM TUBAL LIGATION;  Surgeon: Ena Dawley, MD;  Location: [facility];  Service: Gynecology;  Laterality: Bilateral;  . WISDOM TOOTH EXTRACTION      Social History   Tobacco Use  . Smoking status: Never Smoker  . Smokeless tobacco: Never Used  Substance Use Topics  . Alcohol use: Yes    Comment: cocktail every few months    Family History  Problem Relation Age of Onset  . Hypertension Mother   . COPD Maternal Grandfather   . Diabetes Maternal Grandfather   . Epilepsy Paternal Grandmother   . Breast cancer Maternal Aunt   . Prostate cancer Maternal Uncle     Review of Systems  Constitutional: Negative for chills and fever.  Respiratory: Negative for cough and shortness of breath.   Cardiovascular: Negative for chest pain, palpitations and leg swelling.  Gastrointestinal: Negative for abdominal pain, nausea and vomiting.  per hpi   OBJECTIVE:  Today's Vitals   08/12/19 1151  BP: 130/88  Pulse: 80  Temp: 98.4 F (36.9 C)  TempSrc: Temporal  SpO2: 100%  Weight: 210 lb (95.3 kg)  Height: 5' 6.5" (1.689 m)   Body mass index is 33.39 kg/m.   Physical Exam Vitals and nursing note reviewed.  Constitutional:      Appearance: She is well-developed.  HENT:     Head: Normocephalic and atraumatic.     Mouth/Throat:     Pharynx: No oropharyngeal exudate.  Eyes:     General: No scleral icterus.    Conjunctiva/sclera: Conjunctivae normal.     Pupils: Pupils are equal, round, and reactive to light.  Cardiovascular:     Rate and Rhythm: Normal rate and regular rhythm.     Heart sounds: Normal heart sounds. No murmur. No friction rub. No gallop.   Pulmonary:     Effort: Pulmonary effort is normal.     Breath sounds: Normal breath sounds. No wheezing or rales.  Musculoskeletal:     Cervical back:  Neck supple.     Comments: Left lower leg with area of swelling and mild keloid at incision site, no redness or TTP Ankle FROM wo pain Neg thompson test Strength 5/5 Decreased sensation along distal lateral lower leg and along edge of lateral foot +2 pedal pulses  Skin:    General: Skin is warm and dry.  Neurological:     Mental Status: She is alert and oriented to person, place, and time.     No results found for this or any previous visit (from the past 24 hour(s)).  No results found.   ASSESSMENT and PLAN  1. Prediabetes Checking labs today. Continue with LFM - Comprehensive metabolic panel - Hemoglobin A1c - Lipid panel  2. OSA on CPAP Reports nightly use  3. Numbness of left foot Normal function and strength, continued monitoring for return of sensation  Return in about 1 year (around 08/11/2020).    Myles Lipps, MD Primary Care at [facility] [address] [place], [place] [address] Ph.  [phone] Fax [phone]

## 2019-08-13 LAB — COMPREHENSIVE METABOLIC PANEL
ALT: 10 IU/L (ref 0–32)
AST: 12 IU/L (ref 0–40)
Albumin/Globulin Ratio: 1.5 (ref 1.2–2.2)
Albumin: 4.5 g/dL (ref 3.8–4.8)
Alkaline Phosphatase: 83 IU/L (ref 39–117)
BUN/Creatinine Ratio: 11 (ref 9–23)
BUN: 8 mg/dL (ref 6–20)
Bilirubin Total: 0.6 mg/dL (ref 0.0–1.2)
CO2: 23 mmol/L (ref 20–29)
Calcium: 9.7 mg/dL (ref 8.7–10.2)
Chloride: 101 mmol/L (ref 96–106)
Creatinine, Ser: 0.72 mg/dL (ref 0.57–1.00)
GFR calc Af Amer: 126 mL/min/{1.73_m2} (ref >59)
GFR calc non Af Amer: 110 mL/min/{1.73_m2} (ref >59)
Globulin, Total: 3 g/dL (ref 1.5–4.5)
Glucose: 149 mg/dL — ABNORMAL HIGH (ref 65–99)
Potassium: 4.1 mmol/L (ref 3.5–5.2)
Sodium: 140 mmol/L (ref 134–144)
Total Protein: 7.5 g/dL (ref 6.0–8.5)

## 2019-08-13 LAB — LIPID PANEL
Chol/HDL Ratio: 2.9 ratio (ref 0.0–4.4)
Cholesterol, Total: 156 mg/dL (ref 100–199)
HDL: 54 mg/dL (ref >39)
LDL Chol Calc (NIH): 88 mg/dL (ref 0–99)
Triglycerides: 71 mg/dL (ref 0–149)
VLDL Cholesterol Cal: 14 mg/dL (ref 5–40)

## 2019-08-13 LAB — HEMOGLOBIN A1C
Est. average glucose Bld gHb Est-mCnc: 123 mg/dL
Hgb A1c MFr Bld: 5.9 % — ABNORMAL HIGH (ref 4.8–5.6)

## 2019-09-03 ENCOUNTER — Ambulatory Visit: Payer: Self-pay | Admitting: Adult Health

## 2019-09-03 ENCOUNTER — Encounter: Payer: Self-pay | Admitting: Adult Health

## 2020-02-14 ENCOUNTER — Ambulatory Visit: Payer: Self-pay | Admitting: Family Medicine

## 2020-02-17 ENCOUNTER — Encounter: Payer: Self-pay | Admitting: Family Medicine

## 2020-02-21 ENCOUNTER — Ambulatory Visit: Payer: Self-pay | Admitting: Family Medicine

## 2020-03-08 ENCOUNTER — Emergency Department (HOSPITAL_BASED_OUTPATIENT_CLINIC_OR_DEPARTMENT_OTHER)
Admission: EM | Admit: 2020-03-08 | Discharge: 2020-03-08 | Disposition: A | Discharge status: Home / Self Care | Payer: Self-pay | Attending: Emergency Medicine | Admitting: Emergency Medicine

## 2020-03-08 ENCOUNTER — Emergency Department (HOSPITAL_BASED_OUTPATIENT_CLINIC_OR_DEPARTMENT_OTHER): Payer: Self-pay

## 2020-03-08 ENCOUNTER — Other Ambulatory Visit: Payer: Self-pay

## 2020-03-08 ENCOUNTER — Encounter (HOSPITAL_BASED_OUTPATIENT_CLINIC_OR_DEPARTMENT_OTHER): Payer: Self-pay | Admitting: Emergency Medicine

## 2020-03-08 DIAGNOSIS — M25512 Pain in left shoulder: Secondary | ICD-10-CM | POA: Insufficient documentation

## 2020-03-08 DIAGNOSIS — M79602 Pain in left arm: Secondary | ICD-10-CM | POA: Insufficient documentation

## 2020-03-08 LAB — BASIC METABOLIC PANEL
Anion gap: 10 (ref 5–15)
BUN: 10 mg/dL (ref 6–20)
CO2: 26 mmol/L (ref 22–32)
Calcium: 9.6 mg/dL (ref 8.9–10.3)
Chloride: 103 mmol/L (ref 98–111)
Creatinine, Ser: 0.73 mg/dL (ref 0.44–1.00)
GFR calc Af Amer: >60 mL/min (ref >60)
GFR calc non Af Amer: >60 mL/min (ref >60)
Glucose, Bld: 102 mg/dL — ABNORMAL HIGH (ref 70–99)
Potassium: 4.1 mmol/L (ref 3.5–5.1)
Sodium: 139 mmol/L (ref 135–145)

## 2020-03-08 LAB — CBC
HCT: 41.2 % (ref 36.0–46.0)
Hemoglobin: 13 g/dL (ref 12.0–15.0)
MCH: 26 pg (ref 26.0–34.0)
MCHC: 31.6 g/dL (ref 30.0–36.0)
MCV: 82.4 fL (ref 80.0–100.0)
Platelets: 267 10*3/uL (ref 150–400)
RBC: 5 MIL/uL (ref 3.87–5.11)
RDW: 14.6 % (ref 11.5–15.5)
WBC: 9.3 10*3/uL (ref 4.0–10.5)
nRBC: 0 % (ref 0.0–0.2)

## 2020-03-08 LAB — TROPONIN I (HIGH SENSITIVITY): Troponin I (High Sensitivity): <2 ng/L (ref <18)

## 2020-03-08 LAB — PREGNANCY, URINE: Preg Test, Ur: NEGATIVE

## 2020-03-08 NOTE — Discharge Instructions (Addendum)
Take ibuprofen 400 mg about twice a day for the next 3 days and see if helps the pain.  If pain not significantly improved follow-up with sports medicine.

## 2020-03-08 NOTE — ED Triage Notes (Signed)
Sharp chest and L arm pain since yesterday.

## 2020-03-09 ENCOUNTER — Telehealth: Payer: Self-pay | Admitting: Family Medicine

## 2020-03-09 NOTE — ED Provider Notes (Signed)
[facility] EMERGENCY DEPARTMENT Provider Note   CSN: [id number] Arrival date & time: 03/08/20  1627     History Chief Complaint  Patient presents with  . Chest Pain    Casey Hamilton is a 34 y.o. female.  HPI Patient presents with sharp left arm shoulder and chest pain.  States began acutely while working.  States it went from her hand up to her shoulder into her chest.  Had not had an injury.  Worse with certain movements.  No fevers or chills.  No cough.  Has been vaccinated for Covid.  Has not had pains like this before.  No known cardiac history.  Does not smoke.  No numbness or weakness in the arm.  No back pain.    Past Medical History:  Diagnosis Date  . Allergy   . Anemia   . Contact dermatitis   . Medical history non-contributory   . Prediabetes     Patient Active Problem List   Diagnosis Date Noted  . OSA on CPAP 08/12/2019  . Numbness of left foot 08/12/2019  . Visit for suture removal 10/30/2018  . Allergy to amoxicillin 08/01/2018  . Low back pain 08/01/2018  . Pregnant 08/01/2018  . Vaginal discharge 08/01/2018  . Prediabetes 02/20/2017  . Atopic dermatitis 02/01/2016  . Other seasonal allergic rhinitis 02/01/2016  . Menorrhagia with regular cycle 02/01/2016  . S/P tubal ligation 12/05/2013  . Anemia 12/05/2013  . Rectal bleeding - consult to GI; neg eval 12/03/2013    Past Surgical History:  Procedure Laterality Date  . FOOT SURGERY Right   . FOOT SURGERY Left 04/2019   heel spur and achiles release  . TUBAL LIGATION Bilateral 12/04/2013   Procedure: POST PARTUM TUBAL LIGATION;  Surgeon: Kirkland Hun, MD;  Location: [facility];  Service: Gynecology;  Laterality: Bilateral;  . WISDOM TOOTH EXTRACTION       OB History    Gravida  3   Para  3   Term  3   Preterm      AB      Living  3     SAB      TAB      Ectopic      Multiple      Live Births  1           Family History  Problem Relation Age of Onset  .  Hypertension Mother   . COPD Maternal Grandfather   . Diabetes Maternal Grandfather   . Epilepsy Paternal Grandmother   . Breast cancer Maternal Aunt   . Prostate cancer Maternal Uncle     Social History   Tobacco Use  . Smoking status: Never Smoker  . Smokeless tobacco: Never Used  Substance Use Topics  . Alcohol use: Yes    Comment: cocktail every few months  . Drug use: No    Home Medications Prior to Admission medications   Not on File    Allergies    Amoxicillin  Review of Systems   Review of Systems  Constitutional: Negative for appetite change.  HENT: Negative for congestion.   Respiratory: Negative for shortness of breath.   Cardiovascular: Positive for chest pain.  Gastrointestinal: Negative for abdominal pain.  Genitourinary: Negative for flank pain.  Musculoskeletal: Negative for back pain.       Left shoulder and upper extremity pain.  Skin: Negative for rash.  Neurological: Negative for weakness and numbness.    Physical Exam Updated Vital Signs  BP (!) 133/93   Pulse 76   Temp 98.2 F (36.8 C) (Oral)   Resp 16   Ht 5\' 6"  (1.676 m)   Wt 95.7 kg   LMP 02/25/2020   SpO2 95%   BMI 34.06 kg/m   Physical Exam Vitals and nursing note reviewed.  HENT:     Head: Atraumatic.  Cardiovascular:     Rate and Rhythm: Normal rate and regular rhythm.  Pulmonary:     Breath sounds: No wheezing, rhonchi or rales.  Chest:     Chest wall: No tenderness.  Abdominal:     Tenderness: There is no abdominal tenderness.  Musculoskeletal:     Comments: Tenderness to left anterior shoulder with palpation.  Mild pain with movement.  No deformity.  Good range of motion.  No tenderness over elbow or wrist.  Sensation and strength grossly intact in hand.  Skin:    General: Skin is warm.     Capillary Refill: Capillary refill takes less than 2 seconds.  Neurological:     Mental Status: She is alert and oriented to person, place, and time.     ED Results /  Procedures / Treatments   Labs (all labs ordered are listed, but only abnormal results are displayed) Labs Reviewed  BASIC METABOLIC PANEL - Abnormal; Notable for the following components:      Result Value   Glucose, Bld 102 (*)    All other components within normal limits  CBC  PREGNANCY, URINE  TROPONIN I (HIGH SENSITIVITY)    EKG EKG Interpretation  Date/Time:  Sunday March 08 2020 16:38:58 EDT Ventricular Rate:  86 PR Interval:  146 QRS Duration: 72 QT Interval:  326 QTC Calculation: 390 R Axis:   20 Text Interpretation: Normal sinus rhythm Nonspecific T wave abnormality Abnormal ECG Confirmed by 12-23-2005 [phone]) on 03/08/2020 10:09:55 PM   Radiology DG Chest 2 View  Result Date: 03/08/2020 CLINICAL DATA:  Acute onset left chest pain radiating into the left arm at 1 p.m. today. EXAM: CHEST - 2 VIEW COMPARISON:  PA and lateral chest 09/27/2018. FINDINGS: Lungs clear. Heart size normal. No pneumothorax or pleural fluid. No bony abnormality. IMPRESSION: Normal chest. Electronically Signed   By: 09/29/2018 M.D.   On: 03/08/2020 17:10    Procedures Procedures (including critical care time)  Medications Ordered in ED Medications - No data to display  ED Course  I have reviewed the triage vital signs and the nursing notes.  Pertinent labs & imaging results that were available during my care of the patient were reviewed by me and considered in my medical decision making (see chart for details).    MDM Rules/Calculators/A&P                          Patient with pain in left upper extremity and chest.  I think likely emanating from the shoulder.  X-ray not done due to good exam and localized tenderness.  No trauma.  Outpatient follow-up as needed if symptomatic treatment does not improve. Final Clinical Impression(s) / ED Diagnoses Final diagnoses:  Acute pain of left shoulder    Rx / DC Orders ED Discharge Orders    None       05/08/2020,  MD 03/09/20 [phone]

## 2020-03-09 NOTE — Telephone Encounter (Signed)
ED referral for shoulder pain--called pt to offer appt w/ Dr.Schmitz-- left message for her to contact office if appt still needed.  --glh

## 2020-03-26 ENCOUNTER — Telehealth: Payer: Self-pay | Admitting: Family Medicine

## 2020-03-26 NOTE — Telephone Encounter (Signed)
2nd message left for pt to contact office if follow up care appt needed--glh

## 2020-07-23 ENCOUNTER — Emergency Department (HOSPITAL_BASED_OUTPATIENT_CLINIC_OR_DEPARTMENT_OTHER)
Admission: EM | Admit: 2020-07-23 | Discharge: 2020-07-23 | Disposition: A | Discharge status: Home / Self Care | Payer: BLUE CROSS/BLUE SHIELD | Attending: Emergency Medicine | Admitting: Emergency Medicine

## 2020-07-23 ENCOUNTER — Emergency Department (HOSPITAL_BASED_OUTPATIENT_CLINIC_OR_DEPARTMENT_OTHER): Payer: BLUE CROSS/BLUE SHIELD

## 2020-07-23 ENCOUNTER — Other Ambulatory Visit: Payer: Self-pay

## 2020-07-23 ENCOUNTER — Encounter (HOSPITAL_BASED_OUTPATIENT_CLINIC_OR_DEPARTMENT_OTHER): Payer: Self-pay | Admitting: *Deleted

## 2020-07-23 DIAGNOSIS — R102 Pelvic and perineal pain: Secondary | ICD-10-CM

## 2020-07-23 DIAGNOSIS — R1032 Left lower quadrant pain: Secondary | ICD-10-CM | POA: Diagnosis present

## 2020-07-23 DIAGNOSIS — N83202 Unspecified ovarian cyst, left side: Secondary | ICD-10-CM | POA: Insufficient documentation

## 2020-07-23 LAB — LIPASE, BLOOD: Lipase: 38 U/L (ref 11–51)

## 2020-07-23 LAB — URINALYSIS, ROUTINE W REFLEX MICROSCOPIC
Bilirubin Urine: NEGATIVE
Glucose, UA: NEGATIVE mg/dL
Hgb urine dipstick: NEGATIVE
Ketones, ur: NEGATIVE mg/dL
Leukocytes,Ua: NEGATIVE
Nitrite: NEGATIVE
Protein, ur: NEGATIVE mg/dL
Specific Gravity, Urine: 1.01 (ref 1.005–1.030)
pH: 8 (ref 5.0–8.0)

## 2020-07-23 LAB — CBC WITH DIFFERENTIAL/PLATELET
Abs Immature Granulocytes: 0.02 10*3/uL (ref 0.00–0.07)
Basophils Absolute: 0 10*3/uL (ref 0.0–0.1)
Basophils Relative: 1 %
Eosinophils Absolute: 0.3 10*3/uL (ref 0.0–0.5)
Eosinophils Relative: 4 %
HCT: 38.1 % (ref 36.0–46.0)
Hemoglobin: 11.9 g/dL — ABNORMAL LOW (ref 12.0–15.0)
Immature Granulocytes: 0 %
Lymphocytes Relative: 32 %
Lymphs Abs: 2.5 10*3/uL (ref 0.7–4.0)
MCH: 26.2 pg (ref 26.0–34.0)
MCHC: 31.2 g/dL (ref 30.0–36.0)
MCV: 83.9 fL (ref 80.0–100.0)
Monocytes Absolute: 0.4 10*3/uL (ref 0.1–1.0)
Monocytes Relative: 6 %
Neutro Abs: 4.4 10*3/uL (ref 1.7–7.7)
Neutrophils Relative %: 57 %
Platelets: 254 10*3/uL (ref 150–400)
RBC: 4.54 MIL/uL (ref 3.87–5.11)
RDW: 15.1 % (ref 11.5–15.5)
WBC: 7.7 10*3/uL (ref 4.0–10.5)
nRBC: 0 % (ref 0.0–0.2)

## 2020-07-23 LAB — COMPREHENSIVE METABOLIC PANEL
ALT: 14 U/L (ref 0–44)
AST: 15 U/L (ref 15–41)
Albumin: 3.9 g/dL (ref 3.5–5.0)
Alkaline Phosphatase: 53 U/L (ref 38–126)
Anion gap: 6 (ref 5–15)
BUN: 10 mg/dL (ref 6–20)
CO2: 25 mmol/L (ref 22–32)
Calcium: 9 mg/dL (ref 8.9–10.3)
Chloride: 105 mmol/L (ref 98–111)
Creatinine, Ser: 0.67 mg/dL (ref 0.44–1.00)
GFR, Estimated: >60 mL/min (ref >60)
Glucose, Bld: 111 mg/dL — ABNORMAL HIGH (ref 70–99)
Potassium: 4.2 mmol/L (ref 3.5–5.1)
Sodium: 136 mmol/L (ref 135–145)
Total Bilirubin: 0.7 mg/dL (ref 0.3–1.2)
Total Protein: 7.2 g/dL (ref 6.5–8.1)

## 2020-07-23 LAB — PREGNANCY, URINE: Preg Test, Ur: NEGATIVE

## 2020-07-23 MED ORDER — ACETAMINOPHEN 325 MG PO TABS
650.0000 mg | ORAL_TABLET | Freq: Once | ORAL | Status: AC
  Administered 2020-07-23: 650 mg via ORAL
  Filled 2020-07-23: qty 2

## 2020-07-23 MED ORDER — IOHEXOL 300 MG/ML  SOLN
100.0000 mL | Freq: Once | INTRAMUSCULAR | Status: AC | PRN
  Administered 2020-07-23: 100 mL via INTRAVENOUS

## 2020-07-23 NOTE — Discharge Instructions (Signed)
Your work-up today did show a left ovarian cyst, your ultrasound read as below.  This is most likely was causing you to have pain.  Your ultrasound did not show any evidence of ovarian torsion which is reassuring.  I want you to follow-up with your OB/GYN, if you do not have one you can make an appointment at the [facility] outpatient clinic, the information is provided.  If you have any severe pain, vaginal bleeding, new or worsening concerning symptoms and please come back to the emergency department.  You do have a high risk for ovarian torsion.  Continue take Tylenol as directed on the bottle for pain.  IMPRESSION:  1. Minimally complex left ovarian cyst measuring 6.4 cm, with a  small daughter cyst in the periphery. There is blood flow to the  ovarian parenchyma without evidence of torsion. Yearly ultrasound  follow-up is recommended for a cyst of this size.  2. Uterine fibroid measuring 3.8 cm, likely subserosal.  3. Normal sonographic appearance of the right ovary.

## 2020-07-23 NOTE — ED Triage Notes (Signed)
Abdominal pain in her left quadrant since yesterday. She denies constipation.

## 2020-07-23 NOTE — ED Provider Notes (Signed)
[facility] EMERGENCY DEPARTMENT Provider Note   CSN: [id number] Arrival date & time: 07/23/20  1824     History Chief Complaint  Patient presents with  . Abdominal Pain    Casey Hamilton is a 35 y.o. female with pertinent past medical history of anemia, tubal ligation that presents the emerge department today for abdominal pain. Patient states that pain started yesterday, while she was laying down, primarily in her left lower quadrant. Pain does not radiate anywhere. Patient states that it progressed today, therefore prompting her to come here.  Is not intermittent, is constant.  States that pain feels like a sharp stabbing sensation, is a 5 out of 10 currently. States that she thought it felt like a cramp since she is about to start her menstrual cycle, took an Aleve which did not help with the pain. Denies any nausea, vomiting, dysuria hematuria. Denies any fevers or chills. Denies any back pain. Denies any abdominal history, no history of diverticular abscess or diverticulitis. No history of Crohn's. Patient states that she does not have any diarrhea, had normal bowel movement this morning. Does not feel constipated. No blood in her stool. Denies any vaginal complaints.  No vaginal discharge, vaginal bleeding or vaginal pain.  Only abdominal surgery is tubal ligation. No other complaints.  No dysuria or hematuria.  HPI     Past Medical History:  Diagnosis Date  . Allergy   . Anemia   . Contact dermatitis   . Medical history non-contributory   . Prediabetes     Patient Active Problem List   Diagnosis Date Noted  . OSA on CPAP 08/12/2019  . Numbness of left foot 08/12/2019  . Visit for suture removal 10/30/2018  . Allergy to amoxicillin 08/01/2018  . Low back pain 08/01/2018  . Pregnant 08/01/2018  . Vaginal discharge 08/01/2018  . Prediabetes 02/20/2017  . Atopic dermatitis 02/01/2016  . Other seasonal allergic rhinitis 02/01/2016  . Menorrhagia with regular  cycle 02/01/2016  . S/P tubal ligation 12/05/2013  . Anemia 12/05/2013  . Rectal bleeding - consult to GI; neg eval 12/03/2013    Past Surgical History:  Procedure Laterality Date  . FOOT SURGERY Right   . FOOT SURGERY Left 04/2019   heel spur and achiles release  . TUBAL LIGATION Bilateral 12/04/2013   Procedure: POST PARTUM TUBAL LIGATION;  Surgeon: Kirkland Hun, MD;  Location: [facility];  Service: Gynecology;  Laterality: Bilateral;  . WISDOM TOOTH EXTRACTION       OB History    Gravida  3   Para  3   Term  3   Preterm      AB      Living  3     SAB      IAB      Ectopic      Multiple      Live Births  1           Family History  Problem Relation Age of Onset  . Hypertension Mother   . COPD Maternal Grandfather   . Diabetes Maternal Grandfather   . Epilepsy Paternal Grandmother   . Breast cancer Maternal Aunt   . Prostate cancer Maternal Uncle     Social History   Tobacco Use  . Smoking status: Never Smoker  . Smokeless tobacco: Never Used  Substance Use Topics  . Alcohol use: Yes    Comment: cocktail every few months  . Drug use: No    Home Medications  Prior to Admission medications   Not on File    Allergies    Amoxicillin  Review of Systems   Review of Systems  Constitutional: Negative for chills, diaphoresis, fatigue and fever.  HENT: Negative for congestion, sore throat and trouble swallowing.   Eyes: Negative for pain and visual disturbance.  Respiratory: Negative for cough, shortness of breath and wheezing.   Cardiovascular: Negative for chest pain, palpitations and leg swelling.  Gastrointestinal: Positive for abdominal pain. Negative for abdominal distention, diarrhea, nausea and vomiting.  Genitourinary: Negative for difficulty urinating.  Musculoskeletal: Negative for back pain, neck pain and neck stiffness.  Skin: Negative for pallor.  Neurological: Negative for dizziness, speech difficulty, weakness and headaches.   Psychiatric/Behavioral: Negative for confusion.    Physical Exam Updated Vital Signs BP (!) 125/93   Pulse 81   Temp 98.2 F (36.8 C) (Oral)   Resp 20   Ht 5\' 6"  (1.676 m)   Wt 97 kg   LMP 06/25/2020   SpO2 98%   BMI 34.52 kg/m   Physical Exam Constitutional:      General: She is not in acute distress.    Appearance: Normal appearance. She is not ill-appearing, toxic-appearing or diaphoretic.  HENT:     Mouth/Throat:     Mouth: Mucous membranes are moist.     Pharynx: Oropharynx is clear.  Eyes:     General: No scleral icterus.    Extraocular Movements: Extraocular movements intact.     Pupils: Pupils are equal, round, and reactive to light.  Cardiovascular:     Rate and Rhythm: Normal rate and regular rhythm.     Pulses: Normal pulses.     Heart sounds: Normal heart sounds.  Pulmonary:     Effort: Pulmonary effort is normal. No respiratory distress.     Breath sounds: Normal breath sounds. No stridor. No wheezing, rhonchi or rales.  Chest:     Chest wall: No tenderness.  Abdominal:     General: Abdomen is flat. There is no distension.     Palpations: Abdomen is soft.     Tenderness: There is abdominal tenderness in the left lower quadrant. There is no right CVA tenderness, left CVA tenderness, guarding or rebound. Negative signs include Murphy's sign, Rovsing's sign, McBurney's sign, psoas sign and obturator sign.  Genitourinary:    Comments: Patient deferred Musculoskeletal:        General: No swelling or tenderness. Normal range of motion.     Cervical back: Normal range of motion and neck supple. No rigidity.     Right lower leg: No edema.     Left lower leg: No edema.  Skin:    General: Skin is warm and dry.     Capillary Refill: Capillary refill takes less than 2 seconds.     Coloration: Skin is not pale.  Neurological:     General: No focal deficit present.     Mental Status: She is alert and oriented to person, place, and time.  Psychiatric:         Mood and Affect: Mood normal.        Behavior: Behavior normal.     ED Results / Procedures / Treatments   Labs (all labs ordered are listed, but only abnormal results are displayed) Labs Reviewed  CBC WITH DIFFERENTIAL/PLATELET - Abnormal; Notable for the following components:      Result Value   Hemoglobin 11.9 (*)    All other components within normal limits  COMPREHENSIVE METABOLIC  PANEL - Abnormal; Notable for the following components:   Glucose, Bld 111 (*)    All other components within normal limits  URINALYSIS, ROUTINE W REFLEX MICROSCOPIC  PREGNANCY, URINE  LIPASE, BLOOD    EKG None  Radiology CT Abdomen Pelvis W Contrast  Result Date: 07/23/2020 CLINICAL DATA:  Left lower quadrant abdominal pain. EXAM: CT ABDOMEN AND PELVIS WITH CONTRAST TECHNIQUE: Multidetector CT imaging of the abdomen and pelvis was performed using the standard protocol following bolus administration of intravenous contrast. CONTRAST:  OMNIPAQUE IOHEXOL 300 MG/ML  SOLN COMPARISON:  03/24/2016 FINDINGS: Lower chest: The lung bases are clear. The heart size is normal. Hepatobiliary: The liver is normal. Cholelithiasis without acute inflammation.There is no biliary ductal dilation. Pancreas: Normal contours without ductal dilatation. No peripancreatic fluid collection. Spleen: Unremarkable. Adrenals/Urinary Tract: --Adrenal glands: Unremarkable. --Right kidney/ureter: No hydronephrosis or radiopaque kidney stones. --Left kidney/ureter: No hydronephrosis or radiopaque kidney stones. --Urinary bladder: Unremarkable. Stomach/Bowel: --Stomach/Duodenum: No hiatal hernia or other gastric abnormality. Normal duodenal course and caliber. --Small bowel: Unremarkable. --Colon: Unremarkable. --Appendix: Normal. Vascular/Lymphatic: Normal course and caliber of the major abdominal vessels. --No retroperitoneal lymphadenopathy. --No mesenteric lymphadenopathy. --No pelvic or inguinal lymphadenopathy. Reproductive: There  is a fibroid in the uterine fundus measuring approximately 3.6 cm. This is likely subserosal in location. There is a large cystic mass closely associated with the left ovary measuring approximately 6.9 cm. The left ovary appears heterogeneous appearance and hypoenhancing when compared to the right ovary. There is enlargement of both ovarian veins. Other: No ascites or free air. The abdominal wall is normal. Musculoskeletal. No acute displaced fractures. IMPRESSION: 1. There is a large cystic mass closely associated with the left ovary measuring approximately 6.9 cm. The left ovary appears heterogeneous appearance and hypoenhancing when compared to the right ovary. Follow-up with pelvic ultrasound is recommended for both evaluation of this cystic mass in addition to ruling now left ovarian torsion. 2. There is enlargement of both ovarian veins, which can be seen in patients with pelvic congestion syndrome. 3. Cholelithiasis without acute inflammation. 4. Fibroid uterus. Electronically Signed   By: Katherine Mantle M.D.   On: 07/23/2020 20:47   [place] Art/Ven Flow Abd Pelv Doppler  Result Date: 07/23/2020 CLINICAL DATA:  Left lower quadrant abdominopelvic pain. EXAM: TRANSABDOMINAL AND TRANSVAGINAL ULTRASOUND OF PELVIS DOPPLER ULTRASOUND OF OVARIES TECHNIQUE: Both transabdominal and transvaginal ultrasound examinations of the pelvis were performed. Transabdominal technique was performed for global imaging of the pelvis including uterus, ovaries, adnexal regions, and pelvic cul-de-sac. It was necessary to proceed with endovaginal exam following the transabdominal exam to visualize the uterus, endometrium, and ovaries. Color and duplex Doppler ultrasound was utilized to evaluate blood flow to the ovaries. COMPARISON:  Abdominopelvic CT earlier today. FINDINGS: Uterus Measurements: 12.3 x 5.4 x 6.4 cm = volume: 220 mL. Posterior fundal fibroid measures 3.8 x 3.4 x 3.8 cm, corresponding to that seen on CT. This is  likely subserosal. Nabothian cyst in the cervix. Endometrium Thickness: 6-7 mm, normal.  No focal abnormality visualized. Right ovary Measurements: 3.7 x 1.5 x 2.2 cm = volume: 6.4 mL. Normal appearance with normal blood flow. Physiologic follicles. No adnexal mass. Left ovary Measurements: 7.4 x 5.7 x 5.4 cm. = volume: 171 mL. Cyst measuring 6.4 x 4.9 x 4.9 cm. This is primarily simple with a small subcentimeter daughter cyst peripherally. Blood flow is noted to the ovarian parenchyma. Pulsed Doppler evaluation of both ovaries demonstrates normal low-resistance arterial and venous waveforms. Other findings No pelvic  free fluid. IMPRESSION: 1. Minimally complex left ovarian cyst measuring 6.4 cm, with a small daughter cyst in the periphery. There is blood flow to the ovarian parenchyma without evidence of torsion. Yearly ultrasound follow-up is recommended for a cyst of this size. 2. Uterine fibroid measuring 3.8 cm, likely subserosal. 3. Normal sonographic appearance of the right ovary. Electronically Signed   By: Narda RutherfordMelanie  Sanford M.D.   On: 07/23/2020 22:31   US PELVIC COMPLETE WITH TRANSVAGINAL  Result Date: 07/23/2020 CLINICAL DATA:  Left lower quadrant abdominopelvic pain. EXAM: TRANSABDOMINAL AND TRANSVAGINAL ULTRASOUND OF PELVIS DOPPLER ULTRASOUND OF OVARIES TECHNIQUE: Both transabdominal and transvaginal ultrasound examinations of the pelvis were performed. Transabdominal technique was performed for global imaging of the pelvis including uterus, ovaries, adnexal regions, and pelvic cul-de-sac. It was necessary to proceed with endovaginal exam following the transabdominal exam to visualize the uterus, endometrium, and ovaries. Color and duplex Doppler ultrasound was utilized to evaluate blood flow to the ovaries. COMPARISON:  Abdominopelvic CT earlier today. FINDINGS: Uterus Measurements: 12.3 x 5.4 x 6.4 cm = volume: 220 mL. Posterior fundal fibroid measures 3.8 x 3.4 x 3.8 cm, corresponding to that seen  on CT. This is likely subserosal. Nabothian cyst in the cervix. Endometrium Thickness: 6-7 mm, normal.  No focal abnormality visualized. Right ovary Measurements: 3.7 x 1.5 x 2.2 cm = volume: 6.4 mL. Normal appearance with normal blood flow. Physiologic follicles. No adnexal mass. Left ovary Measurements: 7.4 x 5.7 x 5.4 cm. = volume: 171 mL. Cyst measuring 6.4 x 4.9 x 4.9 cm. This is primarily simple with a small subcentimeter daughter cyst peripherally. Blood flow is noted to the ovarian parenchyma. Pulsed Doppler evaluation of both ovaries demonstrates normal low-resistance arterial and venous waveforms. Other findings No pelvic free fluid. IMPRESSION: 1. Minimally complex left ovarian cyst measuring 6.4 cm, with a small daughter cyst in the periphery. There is blood flow to the ovarian parenchyma without evidence of torsion. Yearly ultrasound follow-up is recommended for a cyst of this size. 2. Uterine fibroid measuring 3.8 cm, likely subserosal. 3. Normal sonographic appearance of the right ovary. Electronically Signed   By: Narda RutherfordMelanie  Sanford M.D.   On: 07/23/2020 22:31    Procedures Procedures   Medications Ordered in ED Medications  acetaminophen (TYLENOL) tablet 650 mg (650 mg Oral Given 07/23/20 2017)  iohexol (OMNIPAQUE) 300 MG/ML solution 100 mL (100 mLs Intravenous Contrast Given 07/23/20 2031)    ED Course  I have reviewed the triage vital signs and the nursing notes.  Pertinent labs & imaging results that were available during my care of the patient were reviewed by me and considered in my medical decision making (see chart for details).    MDM Rules/Calculators/A&P                         Merleen French Anaicole White is a 35 y.o. female with pertinent past medical history of anemia, tubal ligation that presents the emerge department today for abdominal pain. Patient is quite tender to the left lower quadrant, will obtain CT scan at this time. Work-up today with CBC without any leukocytosis,  hemoglobin stable. CMP unremarkable. Negative pregnancy test with clean urine. Vital signs unremarkable.  Work-up today shows CBC unremarkable, CMP unremarkable.  Lipase normal, pregnancy test normal.  Urinalysis normal.  CT scan does show a large left ovarian cyst where patient is having pain, recommended ultrasound to further evaluate for torsion.  Ultrasound did not show any evidence of  torsion, did show the complicated cyst on the left side, patient to follow-up with OB.  Upon reevaluation, patient states that pain is gone with Tylenol.  Do not suspect that patient is torsing and retorsing, patient states that pain has been mild and has been constant.  Patient deferred pelvic exam.  Patient states that she is ready to be discharged. Doubt need for further emergent work up at this time. I explained the diagnosis and have given explicit precautions to return to the ER including for any other new or worsening symptoms. The patient understands and accepts the medical plan as it's been dictated and I have answered their questions. Discharge instructions concerning home care and prescriptions have been given. The patient is STABLE and is discharged to home in good condition.   Final Clinical Impression(s) / ED Diagnoses Final diagnoses:  Left ovarian cyst    Rx / DC Orders ED Discharge Orders    None       Farrel Gordon, PA-C 07/23/20 2247    Virgina Norfolk, DO 07/23/20 2259

## 2020-07-25 ENCOUNTER — Other Ambulatory Visit: Payer: Self-pay

## 2020-07-25 ENCOUNTER — Emergency Department (HOSPITAL_BASED_OUTPATIENT_CLINIC_OR_DEPARTMENT_OTHER)
Admission: EM | Admit: 2020-07-25 | Discharge: 2020-07-26 | Disposition: A | Discharge status: Home / Self Care | Payer: BLUE CROSS/BLUE SHIELD | Attending: Emergency Medicine | Admitting: Emergency Medicine

## 2020-07-25 ENCOUNTER — Encounter (HOSPITAL_BASED_OUTPATIENT_CLINIC_OR_DEPARTMENT_OTHER): Payer: Self-pay | Admitting: *Deleted

## 2020-07-25 DIAGNOSIS — R1031 Right lower quadrant pain: Secondary | ICD-10-CM | POA: Insufficient documentation

## 2020-07-25 DIAGNOSIS — R109 Unspecified abdominal pain: Secondary | ICD-10-CM

## 2020-07-25 MED ORDER — POLYETHYLENE GLYCOL 3350 17 G PO PACK: 17.0000 g | PACK | Freq: Every day | ORAL | 1 refills | Status: DC

## 2020-07-25 MED ORDER — DICYCLOMINE HCL 20 MG PO TABS: 20.0000 mg | ORAL_TABLET | Freq: Two times a day (BID) | ORAL | 0 refills | Status: DC

## 2020-07-25 NOTE — ED Triage Notes (Signed)
Pt reports abdominal pain since 2/16. She was eval here this week for same but states pain is now also on the right side of her abdomen instead of just the left. Denies n/v/d. Last BM today was normal per pt. Denies vaginal discharge

## 2020-07-25 NOTE — Discharge Instructions (Addendum)
You were evaluated in the Emergency Department and after careful evaluation, we did not find any emergent condition requiring admission or further testing in the hospital.  Your exam/testing today was overall reassuring.  Recommend taking the MiraLAX medication up to 4 times daily until you achieve 2 or 3 soft stools per day.  You can use the Bentyl medication as needed for abdominal discomfort.  As discussed, if you experience severe pain or fever or nausea or vomiting we recommend return to the emergency department. Thank you for allowing [place] to be a part of your care.

## 2020-07-25 NOTE — ED Provider Notes (Signed)
[facility]PT [facility] Emergency Department Provider Note MRN:  [id number]  Arrival date & time: 07/25/20     Chief Complaint   Abdominal Pain   History of Present Illness   Casey Hamilton is a 35 y.o. year-old female with no pertinent past medical presenting to the ED with chief complaint of abdominal pain.  Has been experiencing abdominal pain for the past 3 to 4 days.  Initially it was left lower quadrant pain and she was seen in the emergency department.  Diagnosed with an ovarian cyst.  She explains that the pain has migrated to the right lower quadrant today.  Denies any other symptoms.  No fever, no nausea, no vomiting, normal appetite today, no constipation, no diarrhea.  No burning with urination, no vaginal discharge.  Started her cycle today.  Review of Systems  A complete 10 system review of systems was obtained and all systems are negative except as noted in the HPI and PMH.   Patient's Health History    Past Medical History:  Diagnosis Date  . Allergy   . Anemia   . Contact dermatitis   . Medical history non-contributory   . Prediabetes     Past Surgical History:  Procedure Laterality Date  . FOOT SURGERY Right   . FOOT SURGERY Left 04/2019   heel spur and achiles release  . TUBAL LIGATION Bilateral 12/04/2013   Procedure: POST PARTUM TUBAL LIGATION;  Surgeon: Kirkland Hun, MD;  Location: [facility];  Service: Gynecology;  Laterality: Bilateral;  . WISDOM TOOTH EXTRACTION      Family History  Problem Relation Age of Onset  . Hypertension Mother   . COPD Maternal Grandfather   . Diabetes Maternal Grandfather   . Epilepsy Paternal Grandmother   . Breast cancer Maternal Aunt   . Prostate cancer Maternal Uncle     Social History   Socioeconomic History  . Marital status: Divorced    Spouse name: Not on file  . Number of children: 2  . Years of education: Not on file  . Highest education level: Not on file  Occupational History  .  Not on file  Tobacco Use  . Smoking status: Never Smoker  . Smokeless tobacco: Never Used  Substance and Sexual Activity  . Alcohol use: Yes    Comment: cocktail every few months  . Drug use: No  . Sexual activity: Yes    Birth control/protection: Surgical    Comment: s/p tubal ligation  Other Topics Concern  . Not on file  Social History Narrative  . Not on file   Social Determinants of Health   Financial Resource Strain: Not on file  Food Insecurity: Not on file  Transportation Needs: Not on file  Physical Activity: Not on file  Stress: Not on file  Social Connections: Not on file  Intimate Partner Violence: Not on file     Physical Exam   Vitals:   07/25/20 2044 07/25/20 2314  BP: 119/89 126/83  Pulse: 83 74  Resp: 16 18  Temp: 98.4 F (36.9 C)   SpO2: 99% 100%    CONSTITUTIONAL: Well-appearing, NAD NEURO:  Alert and oriented x 3, no focal deficits EYES:  eyes equal and reactive ENT/NECK:  no LAD, no JVD CARDIO: Regular rate, well-perfused, normal S1 and S2 PULM:  CTAB no wheezing or rhonchi GI/GU:  normal bowel sounds, non-distended, non-tender MSK/SPINE:  No gross deformities, no edema SKIN:  no rash, atraumatic PSYCH:  Appropriate speech and behavior  *  Additional and/or pertinent findings included in MDM below  Diagnostic and Interventional Summary    EKG Interpretation  Date/Time:    Ventricular Rate:    PR Interval:    QRS Duration:   QT Interval:    QTC Calculation:   R Axis:     Text Interpretation:        Labs Reviewed - No data to display  No orders to display    Medications - No data to display   Procedures  /  Critical Care Procedures  ED Course and Medical Decision Making  I have reviewed the triage vital signs, the nursing notes, and pertinent available records from the EMR.  Listed above are laboratory and imaging tests that I personally ordered, reviewed, and interpreted and then considered in my medical decision making  (see below for details).  Suspect pain is related either to uterine cramps or constipation.  Reassuring vital signs, benign and largely nontender abdomen.  Patient is only 35 years old, just had a CT scan 2 days ago.  We had a shared decision making conversation and we agreed that a repeat CT today would likely do more harm than good.  I doubt appendicitis given the lack of associated symptoms.  However she is made aware that this is possibility and she will return with worsening symptoms.  Has follow-up with GYN for the ovarian cyst.       Elmer Sow. Pilar Plate, MD [facility] Emergency Medicine [facility] [email or web address]  Final Clinical Impressions(s) / ED Diagnoses     ICD-10-CM   1. Abdominal pain, unspecified abdominal location  R10.9     ED Discharge Orders         Ordered    dicyclomine (BENTYL) 20 MG tablet  2 times daily        07/25/20 2358    polyethylene glycol (MIRALAX) 17 g packet  Daily        07/25/20 2358           Discharge Instructions Discussed with and Provided to Patient:     Discharge Instructions     You were evaluated in the Emergency Department and after careful evaluation, we did not find any emergent condition requiring admission or further testing in the hospital.  Your exam/testing today was overall reassuring.  Recommend taking the MiraLAX medication up to 4 times daily until you achieve 2 or 3 soft stools per day.  You can use the Bentyl medication as needed for abdominal discomfort.  As discussed, if you experience severe pain or fever or nausea or vomiting we recommend return to the emergency department. Thank you for allowing [place] to be a part of your care.       Sabas Sous, MD 07/26/20 0001

## 2021-03-25 ENCOUNTER — Ambulatory Visit (INDEPENDENT_AMBULATORY_CARE_PROVIDER_SITE_OTHER): Payer: 59

## 2021-03-25 ENCOUNTER — Encounter: Payer: Self-pay | Admitting: Podiatry

## 2021-03-25 ENCOUNTER — Other Ambulatory Visit: Payer: Self-pay

## 2021-03-25 ENCOUNTER — Ambulatory Visit (INDEPENDENT_AMBULATORY_CARE_PROVIDER_SITE_OTHER): Payer: 59 | Admitting: Podiatry

## 2021-03-25 DIAGNOSIS — M7661 Achilles tendinitis, right leg: Secondary | ICD-10-CM

## 2021-03-25 DIAGNOSIS — M722 Plantar fascial fibromatosis: Secondary | ICD-10-CM | POA: Diagnosis not present

## 2021-03-25 DIAGNOSIS — M7662 Achilles tendinitis, left leg: Secondary | ICD-10-CM | POA: Diagnosis not present

## 2021-03-25 DIAGNOSIS — M778 Other enthesopathies, not elsewhere classified: Secondary | ICD-10-CM | POA: Diagnosis not present

## 2021-03-25 MED ORDER — GABAPENTIN 100 MG PO CAPS: 100.0000 mg | ORAL_CAPSULE | Freq: Three times a day (TID) | ORAL | 3 refills | Status: DC

## 2021-03-25 MED ORDER — TRIAMCINOLONE ACETONIDE 10 MG/ML IJ SUSP
10.0000 mg | Freq: Once | INTRAMUSCULAR | Status: AC
  Administered 2021-03-25: 10 mg

## 2021-03-25 NOTE — Progress Notes (Signed)
note

## 2021-03-26 NOTE — Progress Notes (Signed)
Subjective:   Patient ID: Casey Hamilton, female   DOB: 35 y.o.   MRN: [id number]   HPI Patient presents stating her right heel has been so sore and she is known she has needed surgery for the last couple years but is just been difficult timewise and her left foot the heel is doing great after surgery but she does get some tingling on the outside of the foot and is recently developed some discomfort on the top of the foot   ROS      Objective:  Physical Exam  Neurovascular status found to be intact no change health history did have hysterectomy earlier this year.  Patient is found to have exquisite discomfort plantar aspect of the right heel that is chronically irritated and has not improved but she is just not been able to get it fixed due to her work schedule.  She has some tingling on the left lateral foot but it is localized not extending to the incision on the lower leg and is localized to the outside of the foot itself and has inflammation of the dorsal extensor complex that is painful when pressed     Assessment:  Acute  and combination chronic Planter fasciitis right that is remained inflamed and remains sore and extensor tendinitis left with inflammation noted with the possibility for some kind of low-grade nerve irritation left that does not stop her from doing anything but to be irritated at times     Plan:  H&P reviewed all conditions.  Due to the longstanding nature of her right and continued problems and the fact the left had to be fixed I do think surgical intervention is necessary and she does not want any injections on the right 1.  I did discuss that she does not have significant equinus right I do not think we need to do the gastroc and can do the endoscopic procedure by itself which should be a faster recovery as she wants to pursue this and I allowed her to read consent form for the right foot going over all possible complications and the fact that just because 1 did  so well no guarantee on the other 1 and at this point after extensive review she signed consent form and it air fracture walker dispensed for the right lower leg.  For the left I am placing her on low dose gabapentin to try to help with the nerve irritation she is experiencing and I did do extensor injection left after sterile prep 3 mg Kenalog 5 mg Xylocaine.  She is to call with any questions prior to procedure and encouraged to use her boot prior to procedure to try to help with the pain  X-rays indicate that there is no signs of fracture left no indications of stress fracture with spurring formation bilateral heel no indications of other arthritic pathology

## 2021-03-30 ENCOUNTER — Telehealth: Payer: Self-pay

## 2021-03-30 NOTE — Telephone Encounter (Signed)
DOS 04/06/2021  EPF RT - [id number]  [facility] EFFECTIVE DATE - 06/06/2020  SPOKE TO [facility] AT [facility], NO PRECERT REQUIRED FOR CPT [phone]. CALL REF # [id number]

## 2021-04-01 DIAGNOSIS — M79676 Pain in unspecified toe(s): Secondary | ICD-10-CM

## 2021-04-05 MED ORDER — HYDROCODONE-ACETAMINOPHEN 10-325 MG PO TABS
1.0000 | ORAL_TABLET | Freq: Three times a day (TID) | ORAL | 0 refills | Status: AC | PRN
Start: 2021-04-05 — End: 2021-04-10

## 2021-04-06 ENCOUNTER — Encounter: Payer: Self-pay | Admitting: Podiatry

## 2021-04-06 DIAGNOSIS — M722 Plantar fascial fibromatosis: Secondary | ICD-10-CM | POA: Diagnosis not present

## 2021-04-09 ENCOUNTER — Telehealth: Payer: Self-pay | Admitting: *Deleted

## 2021-04-09 NOTE — Telephone Encounter (Signed)
Patient is calling because she is having a burning sensation on foot ,mostly on top after surgery.Is this normal? Please advise

## 2021-04-12 ENCOUNTER — Other Ambulatory Visit: Payer: Self-pay

## 2021-04-12 ENCOUNTER — Encounter: Payer: Self-pay | Admitting: Podiatry

## 2021-04-12 ENCOUNTER — Ambulatory Visit: Payer: 59

## 2021-04-12 ENCOUNTER — Ambulatory Visit (INDEPENDENT_AMBULATORY_CARE_PROVIDER_SITE_OTHER): Payer: 59 | Admitting: Podiatry

## 2021-04-12 DIAGNOSIS — M7661 Achilles tendinitis, right leg: Secondary | ICD-10-CM | POA: Diagnosis not present

## 2021-04-12 DIAGNOSIS — M7662 Achilles tendinitis, left leg: Secondary | ICD-10-CM

## 2021-04-12 DIAGNOSIS — M722 Plantar fascial fibromatosis: Secondary | ICD-10-CM

## 2021-04-12 NOTE — Progress Notes (Signed)
Subjective:   Patient ID: Casey Hamilton, female   DOB: 35 y.o.   MRN: [id number]   HPI Patient states overall doing well except that the boot is starting to irritate her skin and she is wondering if there is a different way that she could stretch her foot out during the postoperative healing process   ROS      Objective:  Physical Exam  Neurovascular status intact negative Denna Haggard' sign noted wound edges well coapted stitches intact minimal plantar pain noted with patient found to have good correction from endoscopic surgery with some abrasion dorsal surface     Assessment:  Overall doing well some irritation of dorsal tissue secondary to wearing the boot extensively     Plan:  H PA reviewed condition sterile dressing reapplied dispensed night splint which will be easier more tolerable for her to wear at home continue stretching and will reappoint 2 weeks stitch removal earlier if needed

## 2021-05-03 ENCOUNTER — Ambulatory Visit (INDEPENDENT_AMBULATORY_CARE_PROVIDER_SITE_OTHER): Payer: 59

## 2021-05-03 ENCOUNTER — Other Ambulatory Visit: Payer: Self-pay

## 2021-05-03 DIAGNOSIS — M722 Plantar fascial fibromatosis: Secondary | ICD-10-CM

## 2021-05-03 NOTE — Progress Notes (Signed)
Patient in office today for suture removal. Patient denies nausea, vomiting, fever and chills. Patient reports some burning and tingling in the toes. Sutures were removal without complication. Advised patient to follow-up with Dr. Charlsie Merles in 2 weeks if the burning and tingling continues.

## 2021-05-17 ENCOUNTER — Other Ambulatory Visit: Payer: Self-pay

## 2021-05-17 ENCOUNTER — Encounter: Payer: Self-pay | Admitting: Podiatry

## 2021-05-17 ENCOUNTER — Ambulatory Visit (INDEPENDENT_AMBULATORY_CARE_PROVIDER_SITE_OTHER): Payer: 59 | Admitting: Podiatry

## 2021-05-17 DIAGNOSIS — M722 Plantar fascial fibromatosis: Secondary | ICD-10-CM

## 2021-05-17 NOTE — Progress Notes (Signed)
Subjective:   Patient ID: Casey Hamilton, female   DOB: 35 y.o.   MRN: [id number]   HPI Patient states overall doing very well getting some tingling on top of her foot but the heel pain is pretty much resolved with mild pain in the arch   ROS      Objective:  Physical Exam  Neurovascular status intact with inflammation of the mid arch area mild in intensity with good relief of discomfort in the plantar heel     Assessment:  Doing well from endoscopic surgery right     Plan:  Reviewed the continuation of anti-inflammatory support physical therapy and reappoint as needed

## 2021-06-05 DIAGNOSIS — Z20822 Contact with and (suspected) exposure to covid-19: Secondary | ICD-10-CM | POA: Diagnosis not present

## 2021-09-23 DIAGNOSIS — Z7689 Persons encountering health services in other specified circumstances: Secondary | ICD-10-CM | POA: Insufficient documentation

## 2021-09-23 NOTE — Progress Notes (Signed)
? ?New Patient Office Visit ? ?Subjective   ? ?Patient ID: Casey Hamilton, female    DOB: 05/28/1986  Age: 36 y.o. MRN: [id number] ? ?CC:  ?Chief Complaint  ?Patient presents with  ? Establish Care  ? ? ?HPI ?Casey Hamilton presents to establish care. 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 Her last visit for preventative care and follow up was in 08/2019 with Dr. Lavetta Nielsen.  ? ?Prediabetes- was diagnosed as prediabetic in 2021 with an A1c of 6.3%. Was not prescribed a medication but was recommended to work on diet/lifestyle changes and weight management. Currently, she has not modified her diet and is not doing exercise although she does have some concerns about her weight. Notes that she has gained some weight and originally wanted to talk about weight loss options. Has decided she would rather wait on that conversation and see what her labs look like first.  ? ?Neuropathy- has a history of bilateral plantar fasciitis with surgical release done of each foot. Has had issues with numbness, tingling, and abnormal sensation in both feet. Was previously prescribed gabapentin 100mg  TID which she took as directed. Did not notice a benefit with the medication so stopped it. Not currently treated with medication.  ? ?Sleep apnea- was diagnosed with OSA and received a CPAP machine. Has had it for several years but has been unable to get used to using it. Has tried multiple masks but never tolerated it. Endorses feeling tired and having trouble sleeping. Interested in other options to manage OSA.  ? ?Outpatient Encounter Medications as of 09/24/2021  ?Medication Sig  ? [DISCONTINUED] dicyclomine (BENTYL) 20 MG tablet Take 1 tablet (20 mg total) by mouth 2 (two) times daily.  ? [DISCONTINUED] gabapentin (NEURONTIN) 100 MG capsule Take 1 capsule (100 mg total) by mouth 3 (three) times daily.  ? [DISCONTINUED] polyethylene glycol (MIRALAX) 17 g packet Take 17 g by mouth  daily.  ? ?No facility-administered encounter medications on file as of 09/24/2021.  ? ? ?Past Medical History:  ?Diagnosis Date  ? Allergy   ? Anemia   ? Contact dermatitis   ? Medical history non-contributory   ? Prediabetes   ? Sleep apnea 00/2019  ? ? ?Past Surgical History:  ?Procedure Laterality Date  ? ABDOMINAL HYSTERECTOMY  09/25/2019  ? FOOT SURGERY Right   ? FOOT SURGERY Left 04/2019  ? heel spur and achiles release  ? TUBAL LIGATION Bilateral 12/04/2013  ? Procedure: POST PARTUM TUBAL LIGATION;  Surgeon: 02/04/2014, MD;  Location: [facility];  Service: Gynecology;  Laterality: Bilateral;  ? WISDOM TOOTH EXTRACTION    ? ? ?Family History  ?Problem Relation Age of Onset  ? Hypertension Mother   ? COPD Maternal Grandfather   ? Diabetes Maternal Grandfather   ? Epilepsy Paternal Grandmother   ? Breast cancer Maternal Aunt   ? Prostate cancer Maternal Uncle   ? ? ?Social History  ? ?Socioeconomic History  ? Marital status: Married  ?  Spouse name: Not on file  ? Number of children: 2  ? Years of education: Not on file  ? Highest education level: Not on file  ?Occupational History  ? Not on file  ?Tobacco Use  ? Smoking status: Never  ? Smokeless tobacco: Never  ?Substance and Sexual Activity  ? Alcohol use: Yes  ?  Alcohol/week: 5.0 standard drinks  ?  Types: 3 Glasses of wine, 2 Standard drinks or  equivalent per week  ?  Comment: cocktail every few months  ? Drug use: No  ? Sexual activity: Yes  ?  Birth control/protection: Surgical  ?  Comment: s/p tubal ligation  ?Other Topics Concern  ? Not on file  ?Social History Narrative  ? Not on file  ? ?Social Determinants of Health  ? ?Financial Resource Strain: Not on file  ?Food Insecurity: Not on file  ?Transportation Needs: Not on file  ?Physical Activity: Not on file  ?Stress: Not on file  ?Social Connections: Not on file  ?Intimate Partner Violence: Not on file  ? ? ?Review of Systems  ?Constitutional:  Positive for malaise/fatigue. Negative for chills,  fever and weight loss.  ?HENT:  Negative for congestion, ear pain, hearing loss, sinus pain and sore throat.   ?Eyes:  Negative for blurred vision, photophobia and pain.  ?Respiratory:  Negative for cough, shortness of breath and wheezing.   ?Cardiovascular:  Negative for chest pain, palpitations and leg swelling.  ?Gastrointestinal:  Negative for abdominal pain, constipation, diarrhea, heartburn, nausea and vomiting.  ?Genitourinary:  Negative for dysuria, frequency and urgency.  ?Musculoskeletal:  Positive for back pain. Negative for falls and neck pain.  ?Skin:  Negative for itching and rash.  ?Neurological:  Negative for dizziness, weakness and headaches.  ?Endo/Heme/Allergies:  Negative for polydipsia. Does not bruise/bleed easily.  ?Psychiatric/Behavioral:  Negative for depression, substance abuse and suicidal ideas. The patient has insomnia. The patient is not nervous/anxious.   ?  ?Objective   ? ?BP 128/87   Pulse 66   Resp 20   Ht 5\' 6"  (1.676 m)   Wt 223 lb 1.6 oz (101.2 kg)   SpO2 98%   BMI 36.01 kg/m?  ? ?Physical Exam ?Vitals reviewed.  ?Constitutional:   ?   General: She is not in acute distress. ?   Appearance: Normal appearance. She is obese. She is not ill-appearing.  ?HENT:  ?   Head: Normocephalic and atraumatic.  ?Cardiovascular:  ?   Rate and Rhythm: Normal rate and regular rhythm.  ?   Pulses: Normal pulses.  ?   Heart sounds: Normal heart sounds. No murmur heard. ?  No friction rub. No gallop.  ?Pulmonary:  ?   Effort: Pulmonary effort is normal. No respiratory distress.  ?   Breath sounds: Normal breath sounds. No wheezing.  ?Skin: ?   General: Skin is warm and dry.  ?Neurological:  ?   Mental Status: She is alert and oriented to person, place, and time.  ?Psychiatric:     ?   Mood and Affect: Mood normal.     ?   Behavior: Behavior normal.     ?   Thought Content: Thought content normal.     ?   Judgment: Judgment normal.  ? ? ? ?  ? ?Assessment & Plan:  ? ?Problem List Items Addressed  This Visit   ? ?  ? Respiratory  ? OSA on CPAP  ?  Discussed sleep apnea and the effects on the body with it untreated. Strongly recommend working to wear the CPAP at night as much as possible. Referring to sleep medicine for discussion of further options such as Inspire.  ? ?  ?  ? Relevant Orders  ? Ambulatory referral to Sleep Studies  ?  ? Other  ? Anemia  ? Relevant Orders  ? CBC with Differential/Platelet  ? Prediabetes  ?  Checking hemoglobin A1c. Recommend a low carbohydrate diet, weight loss, and  regular intentional exercise.  ? ?  ?  ? Relevant Orders  ? Hemoglobin A1c  ? Encounter for weight management  ?  Recommend regular intentional exercise with a low calorie, heart healthy diet. Will consider medications based on blood work results since prediabetes must be considered.  ? ?  ?  ? ?Other Visit Diagnoses   ? ? Encounter to establish care    -  Primary  ? Thyroid disorder screen      ? Relevant Orders  ? TSH  ? Preventative health care      ? Relevant Orders  ? CBC with Differential/Platelet  ? COMPLETE METABOLIC PANEL WITH GFR  ? Lipid panel  ? ?  ? ?Return if symptoms worsen or fail to improve.  ?___________________________________________ ?Thayer OhmJoy L. Lorianne Malbrough, DNP, APRN, FNP-BC ?Primary Care and Sports Medicine ?[facility] [place] ? ? ? ?

## 2021-09-24 ENCOUNTER — Encounter: Payer: Self-pay | Admitting: Medical-Surgical

## 2021-09-24 ENCOUNTER — Ambulatory Visit (INDEPENDENT_AMBULATORY_CARE_PROVIDER_SITE_OTHER): Payer: 59 | Admitting: Medical-Surgical

## 2021-09-24 VITALS — BP 128/87 | HR 66 | Resp 20 | Ht 66.0 in | Wt 223.1 lb

## 2021-09-24 DIAGNOSIS — R7303 Prediabetes: Secondary | ICD-10-CM | POA: Diagnosis not present

## 2021-09-24 DIAGNOSIS — Z7689 Persons encountering health services in other specified circumstances: Secondary | ICD-10-CM | POA: Diagnosis not present

## 2021-09-24 DIAGNOSIS — J302 Other seasonal allergic rhinitis: Secondary | ICD-10-CM

## 2021-09-24 DIAGNOSIS — G4733 Obstructive sleep apnea (adult) (pediatric): Secondary | ICD-10-CM | POA: Diagnosis not present

## 2021-09-24 DIAGNOSIS — D649 Anemia, unspecified: Secondary | ICD-10-CM | POA: Diagnosis not present

## 2021-09-24 DIAGNOSIS — Z1329 Encounter for screening for other suspected endocrine disorder: Secondary | ICD-10-CM

## 2021-09-24 DIAGNOSIS — Z0000 Encounter for general adult medical examination without abnormal findings: Secondary | ICD-10-CM | POA: Diagnosis not present

## 2021-09-24 DIAGNOSIS — Z9989 Dependence on other enabling machines and devices: Secondary | ICD-10-CM

## 2021-09-24 NOTE — Assessment & Plan Note (Signed)
Recommend regular intentional exercise with a low calorie, heart healthy diet. Will consider medications based on blood work results since prediabetes must be considered.  ?

## 2021-09-24 NOTE — Assessment & Plan Note (Signed)
Discussed sleep apnea and the effects on the body with it untreated. Strongly recommend working to wear the CPAP at night as much as possible. Referring to sleep medicine for discussion of further options such as Inspire.  ?

## 2021-09-24 NOTE — Assessment & Plan Note (Signed)
Checking hemoglobin A1c. Recommend a low carbohydrate diet, weight loss, and regular intentional exercise.  ?

## 2021-09-25 LAB — CBC WITH DIFFERENTIAL/PLATELET
Absolute Monocytes: 379 cells/uL (ref 200–950)
Basophils Absolute: 47 cells/uL (ref 0–200)
Basophils Relative: 0.6 %
Eosinophils Absolute: 174 cells/uL (ref 15–500)
Eosinophils Relative: 2.2 %
HCT: 39.9 % (ref 35.0–45.0)
Hemoglobin: 12.5 g/dL (ref 11.7–15.5)
Lymphs Abs: 2805 cells/uL (ref 850–3900)
MCH: 25.8 pg — ABNORMAL LOW (ref 27.0–33.0)
MCHC: 31.3 g/dL — ABNORMAL LOW (ref 32.0–36.0)
MCV: 82.4 fL (ref 80.0–100.0)
MPV: 9.7 fL (ref 7.5–12.5)
Monocytes Relative: 4.8 %
Neutro Abs: 4495 cells/uL (ref 1500–7800)
Neutrophils Relative %: 56.9 %
Platelets: 278 10*3/uL (ref 140–400)
RBC: 4.84 10*6/uL (ref 3.80–5.10)
RDW: 13.8 % (ref 11.0–15.0)
Total Lymphocyte: 35.5 %
WBC: 7.9 10*3/uL (ref 3.8–10.8)

## 2021-09-25 LAB — COMPLETE METABOLIC PANEL WITH GFR
AG Ratio: 1.4 (calc) (ref 1.0–2.5)
ALT: 13 U/L (ref 6–29)
AST: 11 U/L (ref 10–30)
Albumin: 4.3 g/dL (ref 3.6–5.1)
Alkaline phosphatase (APISO): 72 U/L (ref 31–125)
BUN: 11 mg/dL (ref 7–25)
CO2: 28 mmol/L (ref 20–32)
Calcium: 9.4 mg/dL (ref 8.6–10.2)
Chloride: 105 mmol/L (ref 98–110)
Creat: 0.75 mg/dL (ref 0.50–0.97)
Globulin: 3 g/dL (calc) (ref 1.9–3.7)
Glucose, Bld: 93 mg/dL (ref 65–99)
Potassium: 4.2 mmol/L (ref 3.5–5.3)
Sodium: 139 mmol/L (ref 135–146)
Total Bilirubin: 0.6 mg/dL (ref 0.2–1.2)
Total Protein: 7.3 g/dL (ref 6.1–8.1)
eGFR: 105 mL/min/{1.73_m2} (ref >=60)

## 2021-09-25 LAB — LIPID PANEL
Cholesterol: 163 mg/dL (ref <200)
HDL: 56 mg/dL (ref >=50)
LDL Cholesterol (Calc): 93 mg/dL (calc)
Non-HDL Cholesterol (Calc): 107 mg/dL (calc) (ref <130)
Total CHOL/HDL Ratio: 2.9 (calc) (ref <5.0)
Triglycerides: 55 mg/dL (ref <150)

## 2021-09-25 LAB — HEMOGLOBIN A1C
Hgb A1c MFr Bld: 6.2 % of total Hgb — ABNORMAL HIGH (ref <5.7)
Mean Plasma Glucose: 131 mg/dL
eAG (mmol/L): 7.3 mmol/L

## 2021-09-25 LAB — TSH: TSH: 0.69 mIU/L

## 2021-09-26 ENCOUNTER — Encounter: Payer: Self-pay | Admitting: Medical-Surgical

## 2021-09-27 ENCOUNTER — Encounter: Payer: Self-pay | Admitting: Medical-Surgical

## 2021-09-27 MED ORDER — METFORMIN HCL ER 500 MG PO TB24: 500.0000 mg | ORAL_TABLET | Freq: Every day | ORAL | 1 refills | Status: DC

## 2021-10-06 DIAGNOSIS — G4733 Obstructive sleep apnea (adult) (pediatric): Secondary | ICD-10-CM | POA: Diagnosis not present

## 2021-11-27 IMAGING — CR DG CHEST 2V
2 series · 2 of 2 positions shown · non-contrast
Comparison: PA and lateral chest 09/27/2018.

CLINICAL DATA: Acute onset left chest pain radiating into the left
arm at 1 p.m. today.

EXAM:
CHEST - 2 VIEW

[w chest pa]
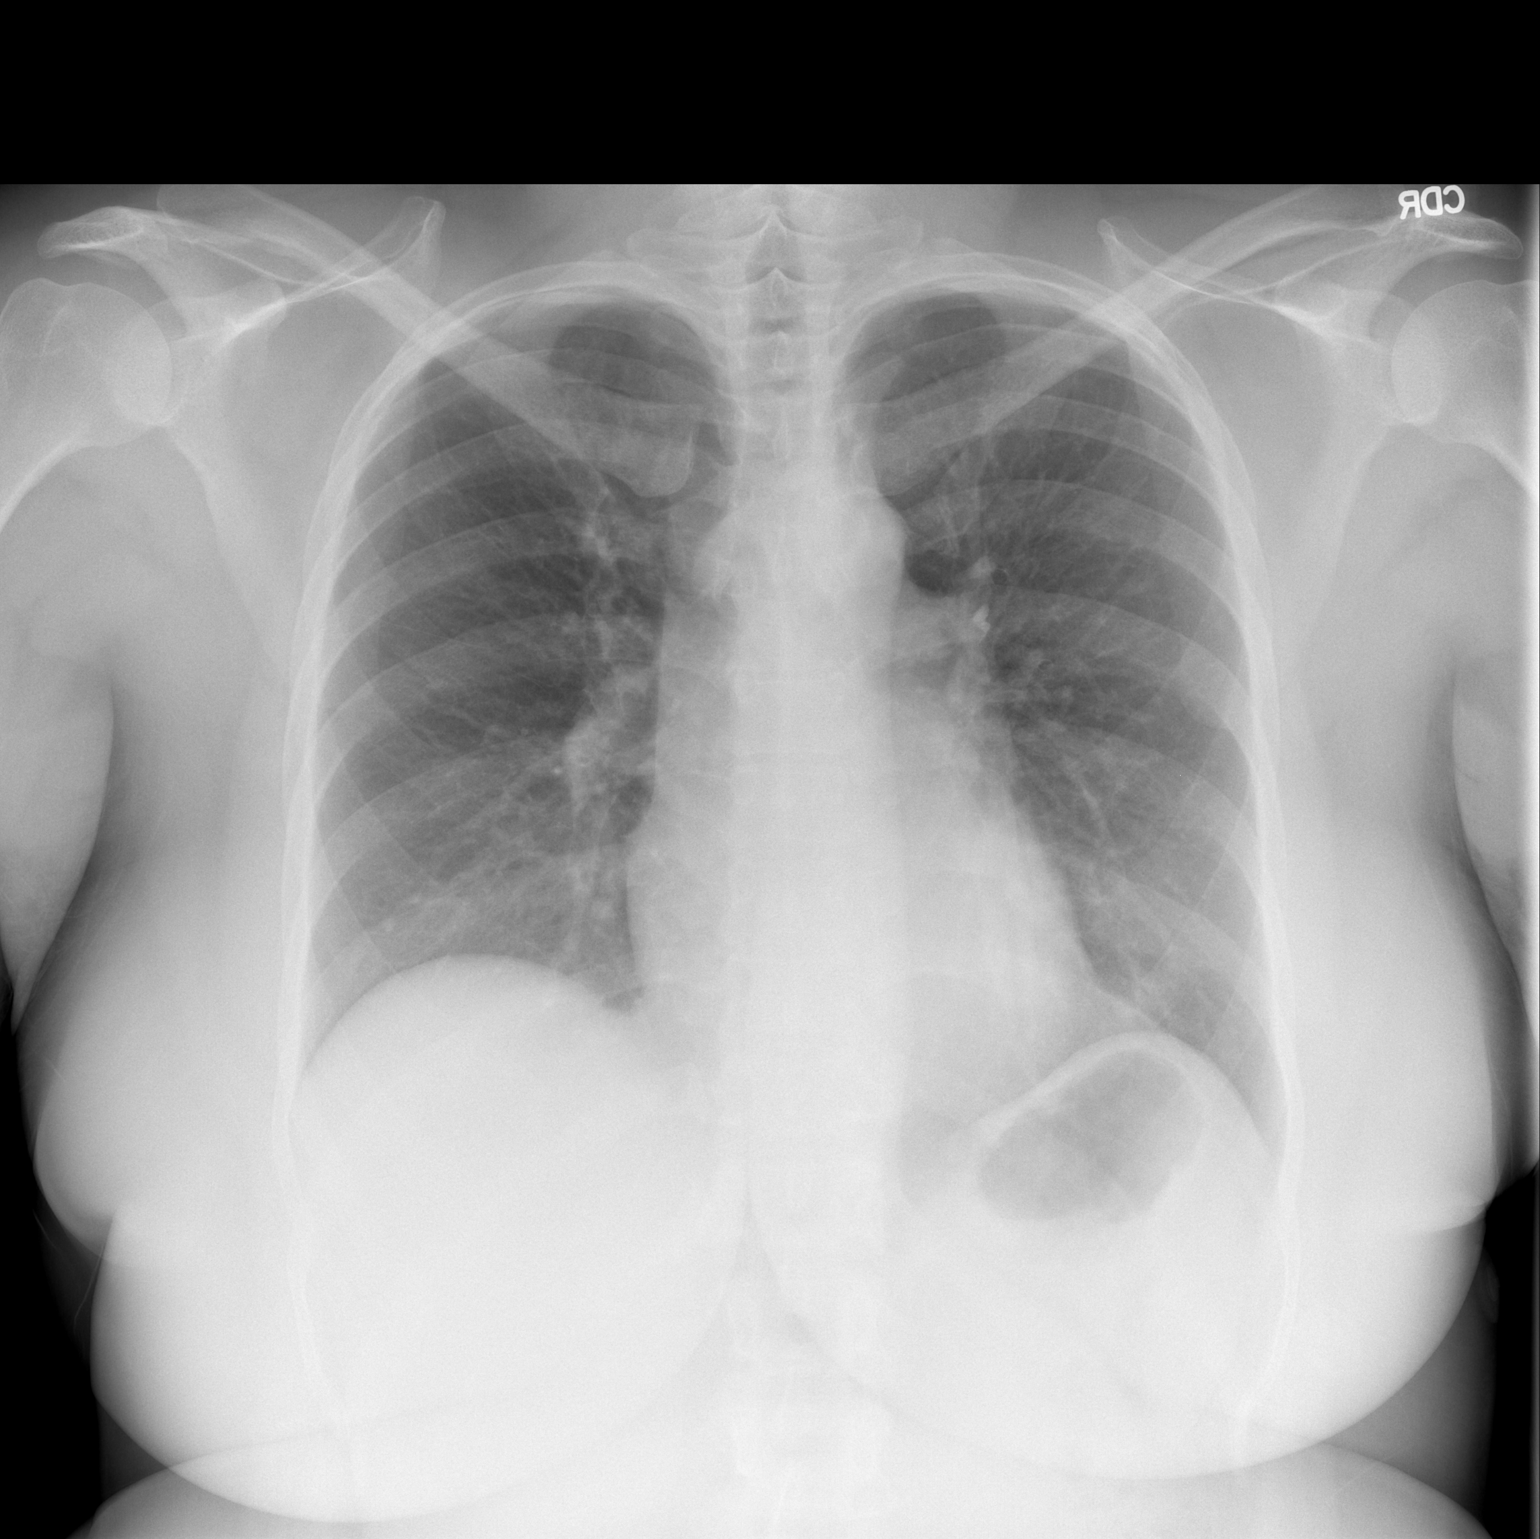

[w chest lat]
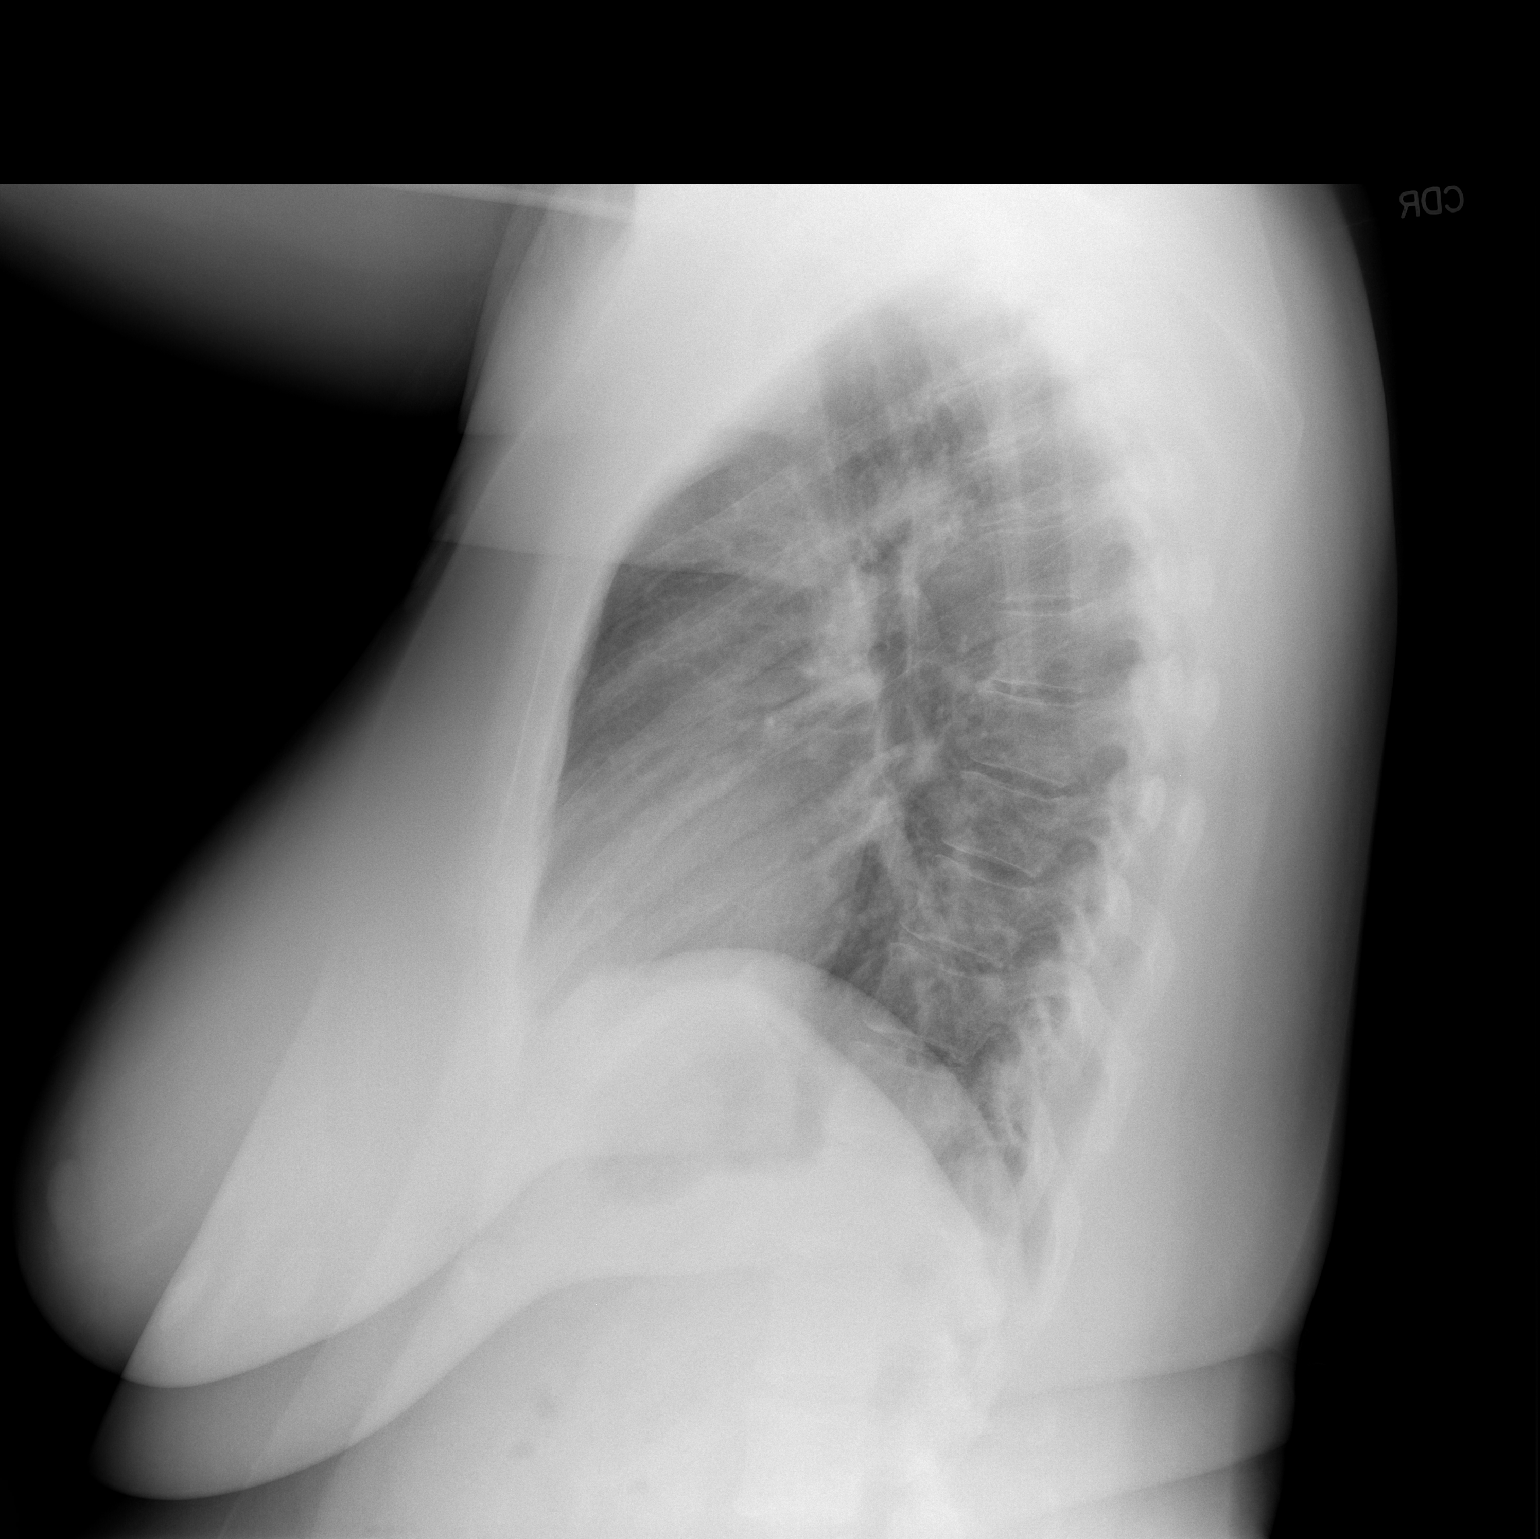

[2 of 2 positions shown; findings below may reference images not displayed]

FINDINGS: Lungs clear. Heart size normal. No pneumothorax or pleural fluid. No
bony abnormality.
IMPRESSION: Normal chest.

## 2022-03-14 ENCOUNTER — Ambulatory Visit: Payer: 59 | Admitting: Medical-Surgical

## 2022-04-13 IMAGING — US US PELVIS COMPLETE WITH TRANSVAGINAL
1 series · 13 of 25 positions shown · non-contrast
Comparison: Abdominopelvic CT earlier today.

CLINICAL DATA: Left lower quadrant abdominopelvic pain.

EXAM:
TRANSABDOMINAL AND TRANSVAGINAL ULTRASOUND OF PELVIS
DOPPLER ULTRASOUND OF OVARIES
TECHNIQUE: Both transabdominal and transvaginal ultrasound examinations of the
pelvis were performed. Transabdominal technique was performed for
global imaging of the pelvis including uterus, ovaries, adnexal
regions, and pelvic cul-de-sac.
It was necessary to proceed with endovaginal exam following the
transabdominal exam to visualize the uterus, endometrium, and
ovaries. Color and duplex Doppler ultrasound was utilized to
evaluate blood flow to the ovaries.

[Series 1: us pelvis complete with transvaginal · 13 of 123 slices shown]
[im 1/123]
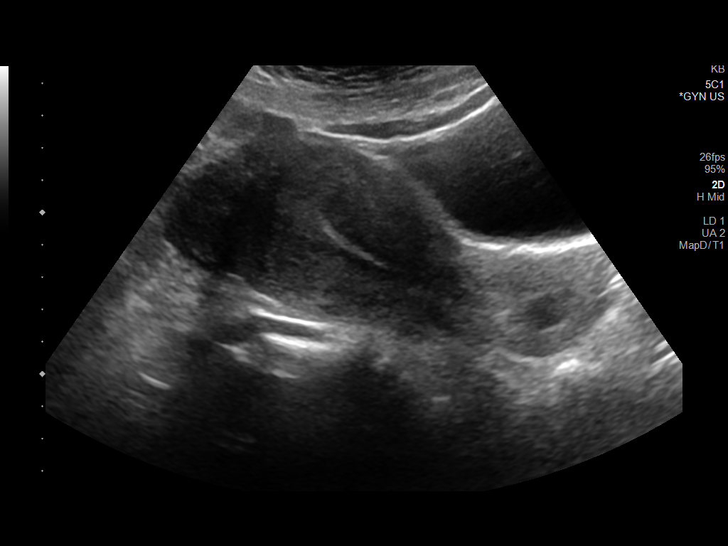
[im 11/123]
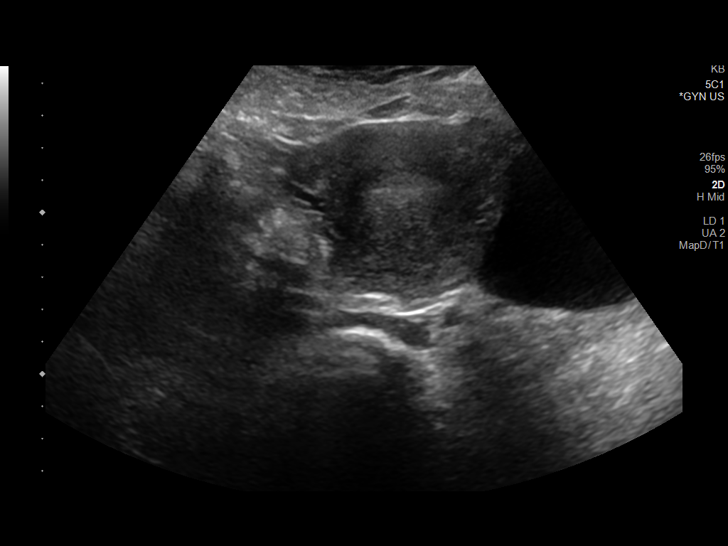
[im 21/123]
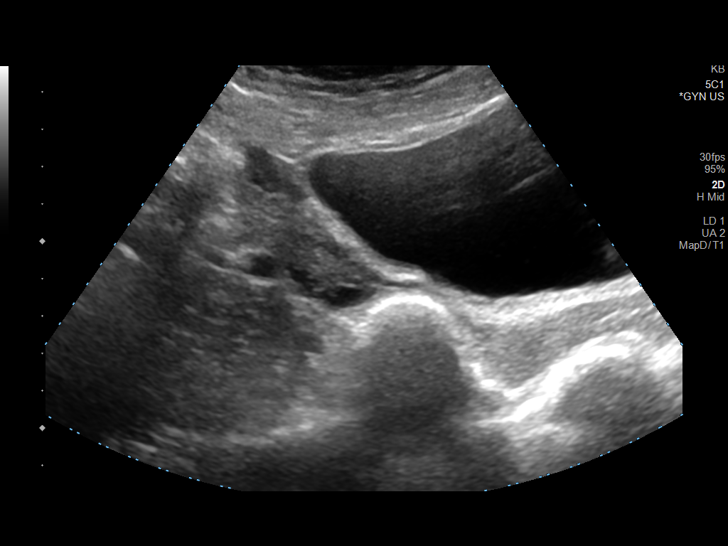
[im 31/123]
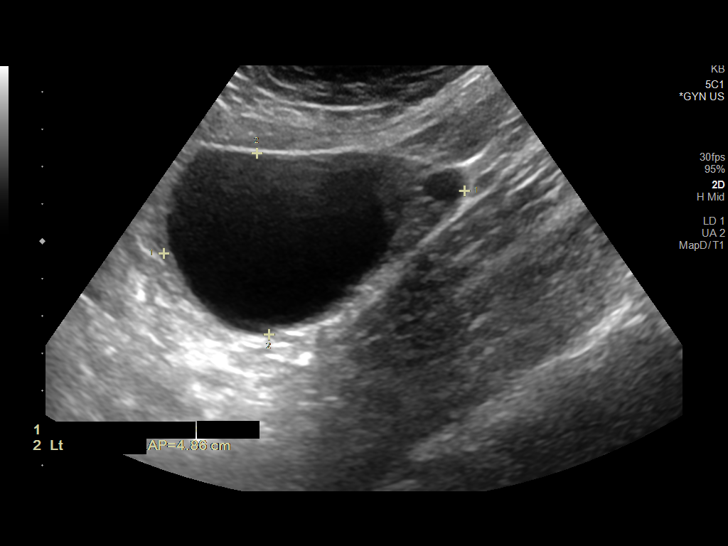
[im 41/123]
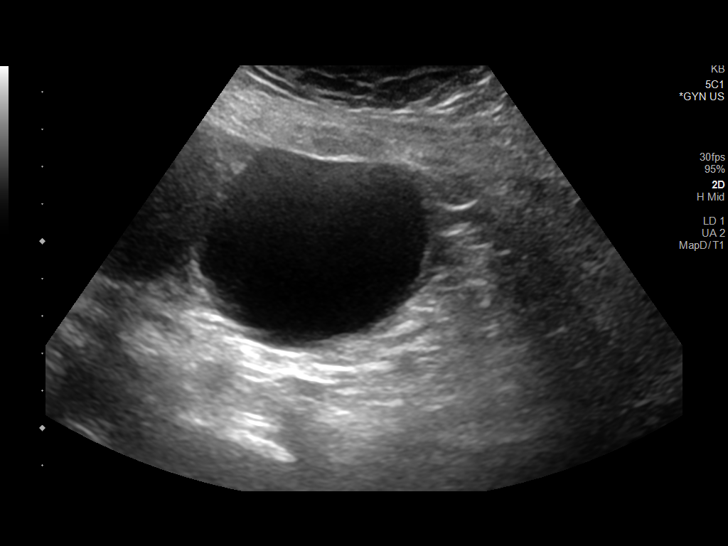
[im 51/123]
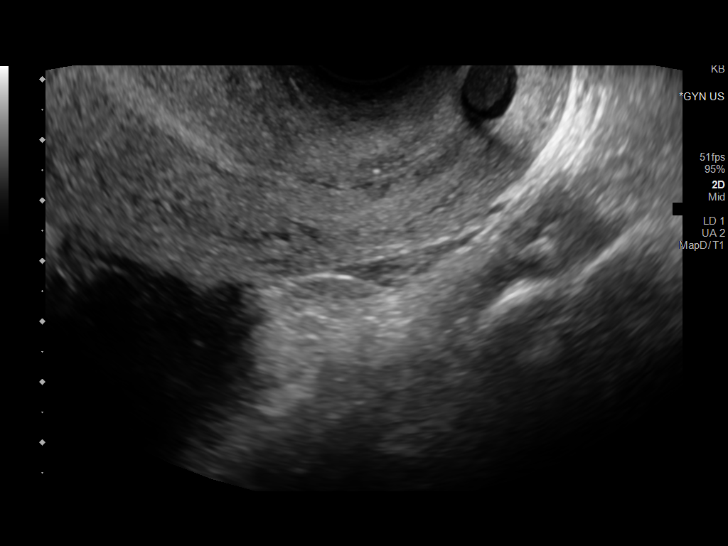
[im 62/123]
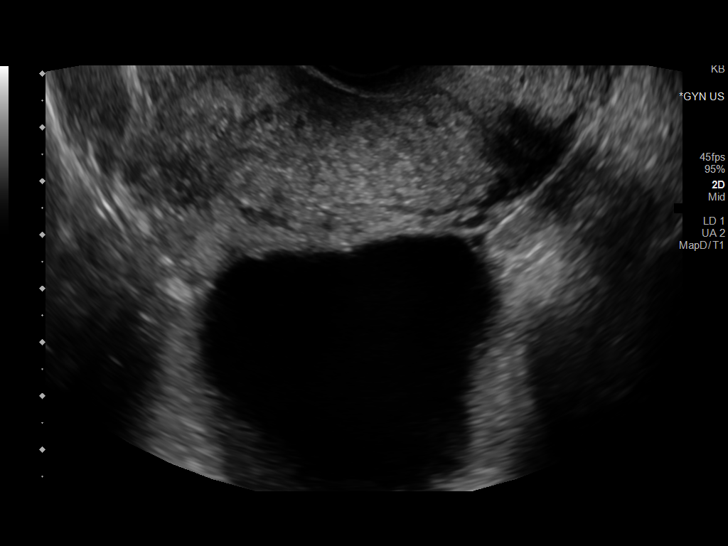
[im 72/123]
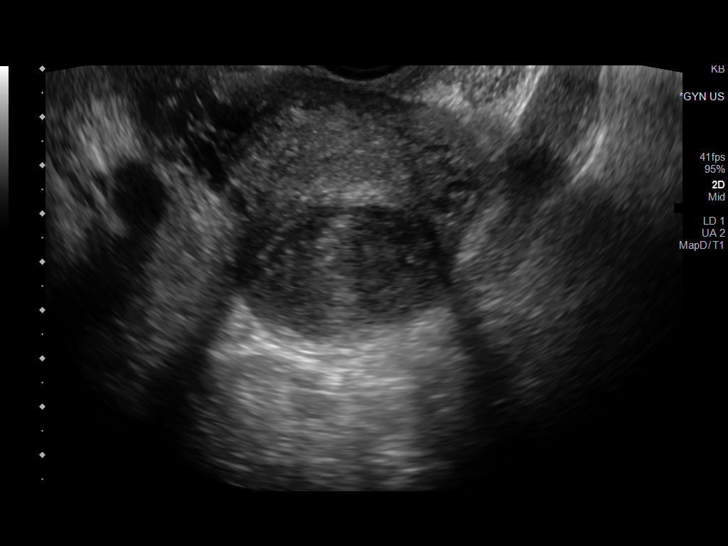
[im 82/123]
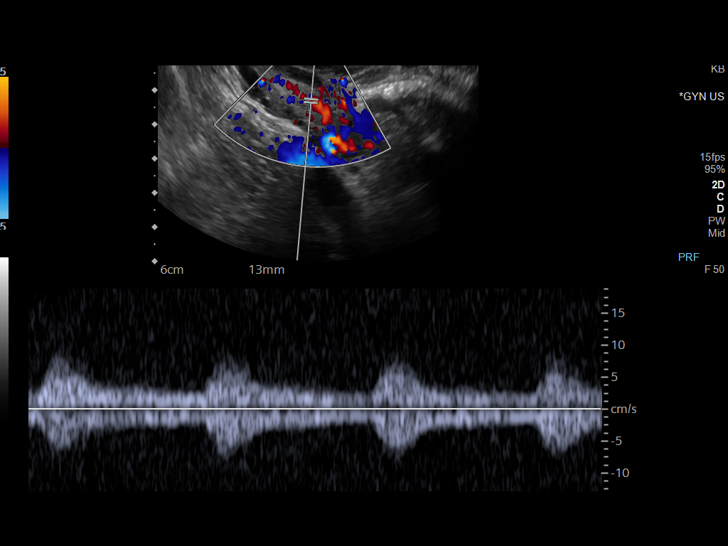
[im 92/123]
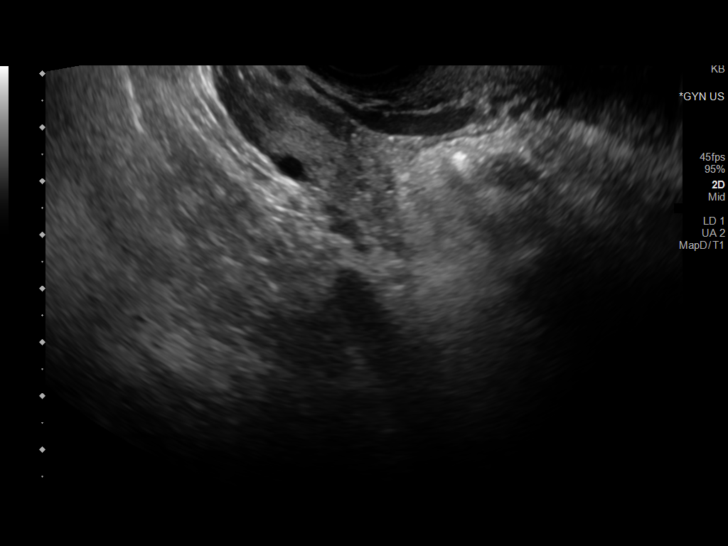
[im 102/123]
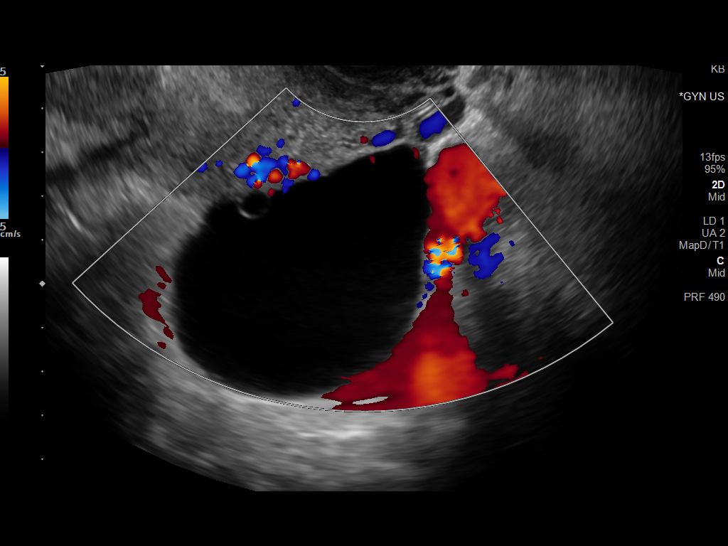
[im 112/123]
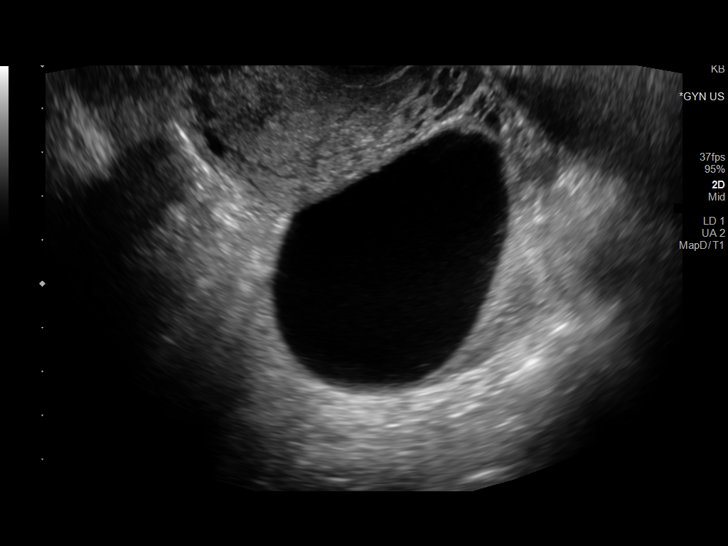
[im 123/123]
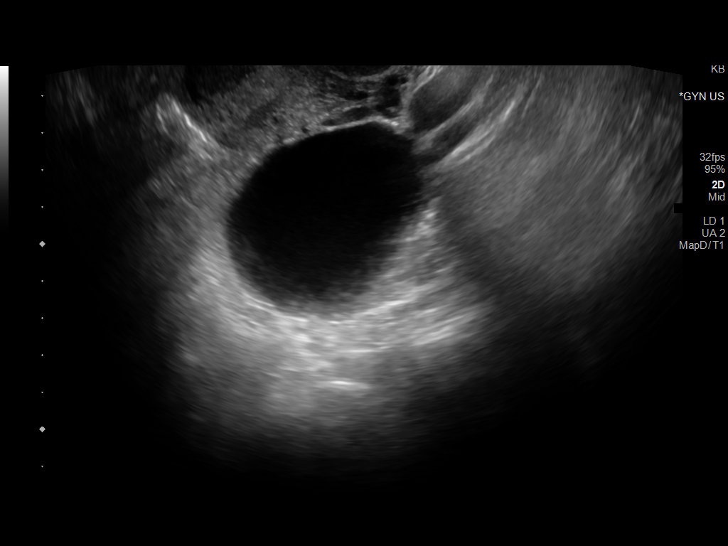

[13 of 25 positions shown; findings below may reference images not displayed]

FINDINGS: Uterus

Measurements: 12.3 x 5.4 x 6.4 cm = volume: 220 mL. Posterior fundal
fibroid measures 3.8 x 3.4 x 3.8 cm, corresponding to that seen on
CT. This is likely subserosal. Nabothian cyst in the cervix.

Endometrium

Thickness: 6-7 mm, normal.  No focal abnormality visualized.

Right ovary

Measurements: 3.7 x 1.5 x 2.2 cm = volume: 6.4 mL. Normal appearance
with normal blood flow. Physiologic follicles. No adnexal mass.

Left ovary

Measurements: 7.4 x 5.7 x 5.4 cm. = volume: 171 mL. Cyst measuring
6.4 x 4.9 x 4.9 cm. This is primarily simple with a small
subcentimeter daughter cyst peripherally. Blood flow is noted to the
ovarian parenchyma.

Pulsed Doppler evaluation of both ovaries demonstrates normal
low-resistance arterial and venous waveforms.

Other findings

No pelvic free fluid.
IMPRESSION: 1. Minimally complex left ovarian cyst measuring 6.4 cm, with a
small daughter cyst in the periphery. There is blood flow to the
ovarian parenchyma without evidence of torsion. Yearly ultrasound
follow-up is recommended for a cyst of this size.
2. Uterine fibroid measuring 3.8 cm, likely subserosal.
3. Normal sonographic appearance of the right ovary.

## 2022-05-12 ENCOUNTER — Ambulatory Visit: Payer: 59 | Admitting: Medical-Surgical

## 2022-05-12 DIAGNOSIS — R7303 Prediabetes: Secondary | ICD-10-CM

## 2022-05-16 ENCOUNTER — Other Ambulatory Visit: Payer: Self-pay

## 2022-06-02 ENCOUNTER — Encounter: Payer: Self-pay | Admitting: Medical-Surgical

## 2022-06-02 ENCOUNTER — Ambulatory Visit: Payer: Federal, State, Local not specified - PPO | Admitting: Medical-Surgical

## 2022-06-02 VITALS — BP 113/83 | HR 91 | Resp 20 | Ht 66.0 in | Wt 225.1 lb

## 2022-06-02 DIAGNOSIS — E1142 Type 2 diabetes mellitus with diabetic polyneuropathy: Secondary | ICD-10-CM

## 2022-06-02 DIAGNOSIS — E669 Obesity, unspecified: Secondary | ICD-10-CM

## 2022-06-02 DIAGNOSIS — L259 Unspecified contact dermatitis, unspecified cause: Secondary | ICD-10-CM | POA: Insufficient documentation

## 2022-06-02 LAB — POCT GLYCOSYLATED HEMOGLOBIN (HGB A1C): HbA1c, POC (prediabetic range): 6.7 % — AB (ref 5.7–6.4)

## 2022-06-02 MED ORDER — BLOOD GLUCOSE MONITOR KIT: PACK | 0 refills | Status: DC

## 2022-06-02 MED ORDER — ONDANSETRON 4 MG PO TBDP: 4.0000 mg | ORAL_TABLET | Freq: Three times a day (TID) | ORAL | 0 refills | Status: AC | PRN

## 2022-06-02 MED ORDER — TIRZEPATIDE 2.5 MG/0.5ML ~~LOC~~ SOAJ: 2.5000 mg | SUBCUTANEOUS | 0 refills | Status: DC

## 2022-06-02 NOTE — Patient Instructions (Signed)
Diabetes Mellitus Action Plan Following a diabetes action plan is a way for you to manage your diabetes (diabetes mellitus) symptoms. The plan is color-coded to help you understand what actions you need to take based on any symptoms you are having. If you have symptoms in the red zone, you need medical care right away. If you have symptoms in the yellow zone, you are having problems. If you have symptoms in the green zone, you are doing well. Learning about and understanding diabetes can take time. Follow the plan that you develop with your health care provider. Know the target range for your blood sugar (glucose) level, and review your treatment plan with your health care provider at each visit. The target range for my blood sugar level is __________________________ mg/dL. Red zone Get medical help right away if you have any of the following symptoms: A blood sugar test result that is below 54 mg/dL (3 mmol/L). A blood sugar test result that is at or above 240 mg/dL (13.3 mmol/L) for 2 days in a row. Confusion or trouble thinking clearly. Difficulty breathing. Sickness or a fever for 2 or more days that is not getting better. Moderate or large ketone levels in your urine. Feeling tired or having no energy. If you have any red zone symptoms, do not wait to see if the symptoms will go away. Get medical help right away. Call your local emergency services (911 in the [place] Do not drive yourself to the hospital. If you have severely low blood sugar (severe hypoglycemia) and you cannot eat or drink, you may need glucagon. Make sure a family member or close friend knows how to check your blood sugar and how to give you glucagon. You may need to be treated in a hospital for this condition. Yellow zone If you have any of the following symptoms, your diabetes is not under control and you may need to make some changes: A blood sugar test result that is at or above 240 mg/dL (13.3 mmol/L) for 2 days in a  row. Blood sugar test results that are below 70 mg/dL (3.9 mmol/L). Other symptoms of hypoglycemia, such as: Shaking or feeling light-headed. Confusion or irritability. Feeling hungry. Having a fast heartbeat. If you have any yellow zone symptoms: Treat your hypoglycemia by eating or drinking 15 grams of a rapid-acting carbohydrate. Follow the 15:15 rule: Take 15 grams of a rapid-acting carbohydrate, such as: 1 tube of glucose gel. 4 glucose pills. 4 oz (120 mL) of fruit juice. 4 oz (120 mL) of regular (not diet) soda. Check your blood sugar 15 minutes after you take the carbohydrate. If the repeat blood sugar test is still at or below 70 mg/dL (3.9 mmol/L), take 15 grams of a carbohydrate again. If your blood sugar does not increase above 70 mg/dL (3.9 mmol/L) after 3 tries, get medical help right away. After your blood sugar returns to normal, eat a meal or a snack within 1 hour. Keep taking your daily medicines as told by your health care provider. Check your blood sugar more often than you normally would. Write down your results. Call your health care provider if you have trouble keeping your blood sugar in your target range.  Green zone These signs mean you are doing well and you can continue what you are doing to manage your diabetes: Your blood sugar is within your personal target range. For most people, a blood sugar level before a meal (preprandial) should be 80-130 mg/dL (4.4-7.2 mmol/L). You feel   well, and you are able to do daily activities. If you are in the green zone, continue to manage your diabetes as told by your health care provider. To do this: Eat a healthy diet. Exercise regularly. Check your blood sugar as told by your health care provider. Take your medicines as told by your health care provider.  Where to find more information American Diabetes Association (ADA): diabetes.org Association of Diabetes Care & Education Specialists (ADCES):  diabeteseducator.org Summary Following a diabetes action plan is a way for you to manage your diabetes symptoms. The plan is color-coded to help you understand what actions you need to take based on any symptoms you are having. Follow the plan that you develop with your health care provider. Make sure you know your personal target blood sugar level. Review your treatment plan with your health care provider at each visit. This information is not intended to replace advice given to you by your health care provider. Make sure you discuss any questions you have with your health care provider. Document Revised: 11/28/2019 Document Reviewed: 11/28/2019 Elsevier Patient Education  2023 Elsevier Inc.  

## 2022-06-02 NOTE — Progress Notes (Signed)
Established Patient Office Visit  Subjective   Patient ID: Casey Hamilton, female   DOB: Aug 03, 1985 Age: 36 y.o. MRN: [id number]   Chief Complaint  Patient presents with   Follow-up   Diabetes   Weight Management Screening    HPI Very pleasant 36 year old female presenting today for follow-up on prediabetes and to discuss weight concerns.  Prediabetes: She is not checking sugars and could not tolerate metformin due to GI upset.  She admits that she is not exercising.  She did originally try to make some dietary changes regarding prediabetes however this did not last very long after she lost multiple family members and had struggles with grief and mental health.  Weight concerns: As above, not currently exercising or making any dietary modifications.  She is interested in medication options that can help with weight loss.   Objective:    Vitals:   06/02/22 1441  BP: 113/83  Pulse: 91  Resp: 20  Height: 5\' 6"  (1.676 m)  Weight: 225 lb 1.9 oz (102.1 kg)  SpO2: 99%  BMI (Calculated): 36.35   Physical Exam Vitals and nursing note reviewed.  Constitutional:      General: She is not in acute distress.    Appearance: Normal appearance. She is not ill-appearing.  HENT:     Head: Normocephalic and atraumatic.  Cardiovascular:     Rate and Rhythm: Normal rate and regular rhythm.     Pulses: Normal pulses.     Heart sounds: Normal heart sounds.  Pulmonary:     Effort: Pulmonary effort is normal. No respiratory distress.     Breath sounds: Normal breath sounds. No wheezing, rhonchi or rales.  Skin:    General: Skin is warm and dry.  Neurological:     Mental Status: She is alert and oriented to person, place, and time.  Psychiatric:        Mood and Affect: Mood normal.        Behavior: Behavior normal.        Thought Content: Thought content normal.        Judgment: Judgment normal.    Results for orders placed or performed in visit on 06/02/22 (from the past 24  hour(s))  POCT HgB A1C     Status: Abnormal   Collection Time: 06/02/22  2:48 PM  Result Value Ref Range   Hemoglobin A1C     HbA1c POC (<> result, manual entry)     HbA1c, POC (prediabetic range) 6.7 (A) 5.7 - 6.4 %   HbA1c, POC (controlled diabetic range)         The ASCVD Risk score (Arnett DK, et al., 2019) failed to calculate for the following reasons:   The 2019 ASCVD risk score is only valid for ages 4 to 71   Assessment & Plan:   1. Type 2 diabetes mellitus with peripheral neuropathy (HCC) POCT hemoglobin A1c checked with a result of 6.7% indicating a new diagnosis of type 2 diabetes.  She was intolerant to metformin so I added this to her intolerance list.  Discussed recommendations for diabetes management including dietary and lifestyle modifications.  Because she is struggling with weight loss, we did discuss self injections with medications such as Ozempic and Mounjaro.  She feels that she would be able to do this so we will start with Mounjaro 2.5 mg weekly for the next 4 weeks.  Sending in Zofran to use as needed for any associated nausea.  Printed prescription for glucometer provided with  instructions to take it to her pharmacy to see what it glucometer will be covered.  Basic diabetic information provided with AVS and recommendations for overall management reviewed verbally. - POCT HgB A1C  2. Obesity, Class II, BMI 35-39.9 Starting Mounjaro as above.  Recommend regular intentional exercise and dietary modifications with a focus on portion control.  Return in about 4 weeks (around 06/30/2022) for DM follow up.  ___________________________________________ Thayer Ohm, DNP, APRN, FNP-BC Primary Care and Sports Medicine [facility]

## 2022-06-03 MED ORDER — ACCU-CHEK GUIDE VI STRP: ORAL_STRIP | 12 refills | Status: DC

## 2022-06-03 MED ORDER — ACCU-CHEK SOFTCLIX LANCETS MISC: 12 refills | Status: DC

## 2022-06-08 ENCOUNTER — Encounter: Payer: Self-pay | Admitting: Medical-Surgical

## 2022-06-08 ENCOUNTER — Telehealth: Payer: Self-pay

## 2022-06-08 NOTE — Telephone Encounter (Signed)
Hello Loyal Gambler,  Will you please help the patient with a mounjaro prior authorization.  I called her pharmacy and was told that it needed a PA. Thank you so much! Lisette Abu, LPN

## 2022-06-13 ENCOUNTER — Telehealth: Payer: Self-pay

## 2022-06-13 NOTE — Telephone Encounter (Signed)
Initiated Prior authorization CHY:[id number] 2.5MG /0.5ML pen-injectors Via: Covermymeds Case/Key:[id number]  Status: approved  as of 06/13/21 Reason:The authorization is valid from 05/14/2022 through 06/13/2023 Notified Pt via: Mychart

## 2022-06-13 NOTE — Telephone Encounter (Signed)
Message sent to patient that approval received .  I called her pharmacy and went through for a $35 co-pay.

## 2022-07-01 ENCOUNTER — Ambulatory Visit: Payer: Commercial Managed Care - PPO | Admitting: Medical-Surgical

## 2022-07-26 ENCOUNTER — Ambulatory Visit: Payer: Commercial Managed Care - PPO | Admitting: Medical-Surgical

## 2022-08-04 ENCOUNTER — Ambulatory Visit: Payer: Federal, State, Local not specified - PPO | Admitting: Medical-Surgical

## 2022-08-04 ENCOUNTER — Encounter: Payer: Self-pay | Admitting: Medical-Surgical

## 2022-08-04 VITALS — BP 123/77 | HR 78 | Resp 20 | Ht 66.0 in | Wt 223.3 lb

## 2022-08-04 DIAGNOSIS — E1142 Type 2 diabetes mellitus with diabetic polyneuropathy: Secondary | ICD-10-CM

## 2022-08-04 LAB — POCT UA - MICROALBUMIN
Creatinine, POC: 100 mg/dL
Microalbumin Ur, POC: 30 mg/L

## 2022-08-04 MED ORDER — TIRZEPATIDE 5 MG/0.5ML ~~LOC~~ SOAJ: 5.0000 mg | SUBCUTANEOUS | 1 refills | Status: DC

## 2022-08-04 MED ORDER — FREESTYLE LIBRE 3 SENSOR MISC: 4 refills | Status: AC

## 2022-08-04 NOTE — Progress Notes (Signed)
Established Patient Office Visit  Subjective   Patient ID: Casey Hamilton, female   DOB: 12/05/1985 Age: 37 y.o. MRN: [id number]   Chief Complaint  Patient presents with   Follow-up   Diabetes   HPI Pleasant 37 year old female presenting to today for follow-up on diabetes.  She started Mounjaro 2.5 mg weekly and completed 4 weeks of the therapy.  Tolerated the medication well with only minimal intermittent nausea.  She was given Zofran which was helpful but notes she only had to take it twice.  Started checking her sugars and has found most of her readings to be less than 120 in the morning.  She has had a couple of readings that were a little higher while on the medication but no higher than 130.  Since she has been off of the [facility] for a couple of weeks due to scheduling issues for follow-up, her sugars have been running a little higher around 140-150.  Reports that poking her fingers is causing significant soreness and is very unpleasant.  Wondering if she can get a continuous glucose monitor even if she has to pay out-of-pocket for it.   Objective:    Vitals:   08/04/22 1557  BP: 123/77  Pulse: 78  Resp: 20  Height: '5\' 6"'$  (1.676 m)  Weight: 223 lb 4.8 oz (101.3 kg)  SpO2: 100%  BMI (Calculated): 36.06    Physical Exam Vitals reviewed.  Constitutional:      General: She is not in acute distress.    Appearance: Normal appearance. She is not ill-appearing.  HENT:     Head: Normocephalic and atraumatic.  Cardiovascular:     Rate and Rhythm: Normal rate and regular rhythm.  Pulmonary:     Effort: Pulmonary effort is normal. No respiratory distress.  Skin:    General: Skin is warm and dry.  Neurological:     Mental Status: She is alert and oriented to person, place, and time.  Psychiatric:        Mood and Affect: Mood normal.        Behavior: Behavior normal.        Thought Content: Thought content normal.        Judgment: Judgment normal.    Results for  orders placed or performed in visit on 08/04/22 (from the past 24 hour(s))  POCT UA - Microalbumin     Status: Abnormal   Collection Time: 08/04/22  4:21 PM  Result Value Ref Range   Microalbumin Ur, POC 30 mg/L   Creatinine, POC 100 mg/dL   Albumin/Creatinine Ratio, Urine, POC 30-300        The ASCVD Risk score (Arnett DK, et al., 2019) failed to calculate for the following reasons:   The 2019 ASCVD risk score is only valid for ages 50 to 42   Assessment & Plan:   1. Type 2 diabetes mellitus with peripheral neuropathy (HCC) She did well on Mounjaro 2.5 mg weekly so we will go ahead and bump this up to 5 mg weekly.  Would like to keep her here for the next few months.  Continue checking sugars but aim for 2-3 times weekly and make sure to stick on the side of the finger to avoid excessive soreness.  Advised that continuous glucose monitors can be difficult to get approved through insurance but she would like to try it so I have sent freestyle libre 3 sensors to the pharmacy.  Checking urine microalbumin today. - tirzepatide [facility]) 5 MG/0.5ML Pen;  Inject 5 mg into the skin once a week.  Dispense: 6 mL; Refill: 1  Return in about 3 months (around 11/02/2022) for DM follow up.  ___________________________________________ Clearnce Sorrel, DNP, APRN, FNP-BC Primary Care and [place]

## 2022-09-19 ENCOUNTER — Encounter: Payer: Self-pay | Admitting: *Deleted

## 2022-11-02 ENCOUNTER — Ambulatory Visit: Payer: Commercial Managed Care - PPO | Admitting: Medical-Surgical

## 2023-01-01 ENCOUNTER — Encounter (HOSPITAL_BASED_OUTPATIENT_CLINIC_OR_DEPARTMENT_OTHER): Payer: Self-pay | Admitting: Emergency Medicine

## 2023-01-01 ENCOUNTER — Emergency Department (HOSPITAL_BASED_OUTPATIENT_CLINIC_OR_DEPARTMENT_OTHER): Payer: Federal, State, Local not specified - PPO

## 2023-01-01 ENCOUNTER — Emergency Department (HOSPITAL_BASED_OUTPATIENT_CLINIC_OR_DEPARTMENT_OTHER)
Admission: EM | Admit: 2023-01-01 | Discharge: 2023-01-01 | Disposition: A | Discharge status: Home / Self Care | Payer: Federal, State, Local not specified - PPO | Attending: Emergency Medicine | Admitting: Emergency Medicine

## 2023-01-01 ENCOUNTER — Other Ambulatory Visit: Payer: Self-pay

## 2023-01-01 DIAGNOSIS — S86002D Unspecified injury of left Achilles tendon, subsequent encounter: Secondary | ICD-10-CM

## 2023-01-01 DIAGNOSIS — W010XXA Fall on same level from slipping, tripping and stumbling without subsequent striking against object, initial encounter: Secondary | ICD-10-CM | POA: Diagnosis not present

## 2023-01-01 DIAGNOSIS — M25572 Pain in left ankle and joints of left foot: Secondary | ICD-10-CM | POA: Diagnosis not present

## 2023-01-01 DIAGNOSIS — M7732 Calcaneal spur, left foot: Secondary | ICD-10-CM | POA: Diagnosis not present

## 2023-01-01 DIAGNOSIS — S86002A Unspecified injury of left Achilles tendon, initial encounter: Secondary | ICD-10-CM | POA: Insufficient documentation

## 2023-01-01 DIAGNOSIS — S86092A Other specified injury of left Achilles tendon, initial encounter: Secondary | ICD-10-CM | POA: Diagnosis not present

## 2023-01-01 NOTE — ED Provider Notes (Signed)
[place] [facility] Provider Note   CSN: [id number] Arrival date & time: 01/01/23  1241     History  Chief Complaint  Patient presents with   Casey Hamilton is a 37 y.o. female.  She is here with complaint of pain behind her left ankle after falling last evening stepping on some child toys on the floor.  She iced it and elevated it last night.  Still having pain with ambulation today.  She has a prior history of Achilles injury requiring surgery.  The history is provided by the patient.  Ankle Pain Location:  Ankle Time since incident:  1 day Injury: yes   Mechanism of injury: fall   Ankle location:  L ankle Pain details:    Severity:  Moderate   Onset quality:  Sudden   Progression:  Unchanged Chronicity:  Recurrent Relieved by:  Nothing Worsened by:  Bearing weight Ineffective treatments:  Ice and elevation Associated symptoms: decreased ROM   Associated symptoms: no numbness and no tingling        Home Medications Prior to Admission medications   Medication Sig Start Date End Date Taking? Authorizing Provider  Accu-Chek Softclix Lancets lancets Check fasting blood sugar every morning and 2 hours after largest meal a couple times weekly. 06/03/22   Christen Butter, NP  blood glucose meter kit and supplies KIT Dispense based on patient and insurance preference. Use up to four times daily as directed. Please include lancets, test strips, control solution. 06/02/22   Christen Butter, NP  Continuous Blood Gluc Sensor (FREESTYLE LIBRE 3 SENSOR) MISC Place 1 sensor on the skin every 14 days. Use to check glucose continuously 08/04/22   Christen Butter, NP  glucose blood (ACCU-CHEK GUIDE) test strip Check fasting blood sugar every morning and 2 hours after largest meal a couple times weekly. 06/03/22   Christen Butter, NP  ondansetron (ZOFRAN-ODT) 4 MG disintegrating tablet Take 1 tablet (4 mg total) by mouth every 8 (eight) hours as needed  for nausea or vomiting. 06/02/22   Christen Butter, NP  tirzepatide [facility]) 5 MG/0.5ML Pen Inject 5 mg into the skin once a week. 08/04/22   Christen Butter, NP      Allergies    Amoxicillin and Metformin and related    Review of Systems   Review of Systems  Physical Exam Updated Vital Signs BP 127/87   Pulse 78   Temp (!) 97 F (36.1 C)   Resp 16   Ht 5\' 7"  (1.702 m)   Wt 90.3 kg   LMP 07/25/2020 Comment: neg preg test  SpO2 99%   BMI 31.17 kg/m  Physical Exam Constitutional:      Appearance: Normal appearance. She is well-developed.  HENT:     Head: Normocephalic and atraumatic.  Eyes:     Conjunctiva/sclera: Conjunctivae normal.  Musculoskeletal:        General: Tenderness present. Normal range of motion.     Cervical back: Neck supple.     Comments: Left lower extremity she is nontender in the ankle.  She is tender over her Achilles although I do not feel a definite defect.  She feels intact.  Foot is nontender.  Distal pulses motor and sensation intact  Skin:    General: Skin is warm and dry.  Neurological:     General: No focal deficit present.     Mental Status: She is alert.     GCS: GCS eye subscore  is 4. GCS verbal subscore is 5. GCS motor subscore is 6.     Sensory: No sensory deficit.     Motor: No weakness.     ED Results / Procedures / Treatments   Labs (all labs ordered are listed, but only abnormal results are displayed) Labs Reviewed - No data to display  EKG None  Radiology DG Ankle Complete Left  Result Date: 01/01/2023 CLINICAL DATA:  Pain after fall EXAM: LEFT ANKLE COMPLETE - 3 VIEW COMPARISON:  None Available. FINDINGS: Small well corticated plantar and Achilles calcaneal spurs. Well corticated densities as well along the dorsal aspect of the distal talus. Chronic process. No acute fracture or dislocation. Preserved joint spaces and bone mineralization. IMPRESSION: No acute osseous abnormality Electronically Signed   By: Karen Kays M.D.   On:  01/01/2023 13:10    Procedures Procedures    Medications Ordered in ED Medications - No data to display  ED Course/ Medical Decision Making/ A&P                             Medical Decision Making Amount and/or Complexity of Data Reviewed Radiology: ordered.   This patient complains of left ankle pain; this involves an extensive number of treatment Options and is a complaint that carries with it a high risk of complications and morbidity. The differential includes fracture, dislocation, ligamentous injury, tendon injury  I ordered imaging studies which included x-rays left ankle and I independently    visualized and interpreted imaging which showed no acute fracture or dislocation Previous records obtained and reviewed patient had surgery from [facility] for Achilles injury Social determinants considered, no significant barriers Critical Interventions: None  After the interventions stated above, I reevaluated the patient and found patient be neurovascularly intact, Achilles appears intact by physical exam and no gross deficits noted, x-rays do not show any acute fracture or dislocation. Admission and further testing considered, patient will be placed in walking boot crutches nonweightbearing status.  Recommended she closely follow-up with her [facility] team.  Return instructions discussed         Final Clinical Impression(s) / ED Diagnoses Final diagnoses:  Injury of left Achilles tendon, subsequent encounter    Rx / DC Orders ED Discharge Orders     None         Terrilee Files, MD 01/01/23 1732

## 2023-01-01 NOTE — Discharge Instructions (Signed)
You were seen in the emergency department for injury to your left ankle after a fall.  Your x-ray did not show a definite fracture or dislocation.  I am concerned there may be injury to your Achilles tendon.  Please use the boot for protection and crutches, nonweightbearing.  Contact your podiatry team for close follow-up.  Ice and ibuprofen.  Return to the emergency department if any worsening or concerning symptoms

## 2023-01-01 NOTE — ED Triage Notes (Signed)
Pt c/o fall yesterday; now has pain to LT posterior ankle; amb to triage

## 2023-01-02 ENCOUNTER — Telehealth: Payer: Self-pay | Admitting: General Practice

## 2023-01-02 NOTE — Transitions of Care (Post Inpatient/ED Visit) (Signed)
   01/02/2023  Name: Casey Hamilton MRN: [id number] DOB: [birth year]  Today's TOC FU Call Status: Today's TOC FU Call Status:: Unsuccessul Call (1st Attempt) Unsuccessful Call (1st Attempt) Date: 01/02/23  Attempted to reach the patient regarding the most recent Inpatient/ED visit.  Follow Up Plan: Additional outreach attempts will be made to reach the patient to complete the Transitions of Care (Post Inpatient/ED visit) call.   Signature [place] Charon, Control and instrumentation engineer

## 2023-01-03 NOTE — Transitions of Care (Post Inpatient/ED Visit) (Signed)
   01/03/2023  Name: Casey Hamilton MRN: [id number] DOB: Sep 23, 1985  Today's TOC FU Call Status: Today's TOC FU Call Status:: Unsuccessful Call (2nd Attempt) Unsuccessful Call (1st Attempt) Date: 01/02/23 Unsuccessful Call (2nd Attempt) Date: 01/03/23  Attempted to reach the patient regarding the most recent Inpatient/ED visit.  Follow Up Plan: Additional outreach attempts will be made to reach the patient to complete the Transitions of Care (Post Inpatient/ED visit) call.   Signature [place] Charon, Control and instrumentation engineer

## 2023-01-04 NOTE — Transitions of Care (Post Inpatient/ED Visit) (Signed)
   01/04/2023  Name: [name] MRN: [id number] DOB: August 20, 1985  Today's TOC FU Call Status: Today's TOC FU Call Status:: Unsuccessful Call (3rd Attempt) Unsuccessful Call (1st Attempt) Date: 01/02/23 Unsuccessful Call (2nd Attempt) Date: 01/03/23 Unsuccessful Call (3rd Attempt) Date: 01/04/23  Attempted to reach the patient regarding the most recent Inpatient/ED visit.  Follow Up Plan: No further outreach attempts will be made at this time. We have been unable to contact the patient.  Signature [place] Charon, Control and instrumentation engineer

## 2023-01-12 ENCOUNTER — Ambulatory Visit: Payer: Federal, State, Local not specified - PPO | Admitting: Podiatry

## 2023-01-12 DIAGNOSIS — M7662 Achilles tendinitis, left leg: Secondary | ICD-10-CM | POA: Diagnosis not present

## 2023-01-12 MED ORDER — MELOXICAM 15 MG PO TABS: 15.0000 mg | ORAL_TABLET | Freq: Every day | ORAL | 0 refills | Status: DC

## 2023-01-12 NOTE — Progress Notes (Signed)
  Subjective:  Patient ID: Casey Hamilton, female    DOB: Nov 18, 1985,   MRN: [id number]  No chief complaint on file.   37 y.o. female presents for new concern of injury to left foot that occurred about a week ago. Relates she tripped over her kids toes and not exactly sure how she landed but hurt her left arm and her heel started hurting. Relates immediate pain. She was seen in urgent care and given a boot and advised to follow-up. Has a history of plantar fasciitis and has had surgery for gastroc as well.  . Denies any other pedal complaints. Denies n/v/f/c.   Past Medical History:  Diagnosis Date   Allergy    Anemia    Contact dermatitis    Medical history non-contributory    Prediabetes    Sleep apnea 00/2019    Objective:  Physical Exam: Vascular: DP/PT pulses 2/4 bilateral. CFT <3 seconds. Normal hair growth on digits. No edema.  Skin. No lacerations or abrasions bilateral feet.  Musculoskeletal: MMT 5/5 bilateral lower extremities in DF, PF, Inversion and Eversion. Deceased ROM in DF of ankle joint. Tender to watershed area and insertion site of achilles. No palpable delve noted. Able to plantar flex about 4/5 MMT. Some pain with DF as well Neurological: Sensation intact to light touch.   Assessment:   1. Achilles tendinitis of left lower extremity      Plan:  Patient was evaluated and treated and all questions answered. -Xrays reviewed from UC. No fractures or dislocations noted. Spurring noted to posterior calcaneus  -Discussed Achilles insertional tendonitis do not feel she has a rupture at this time and treatment options with patient.  -Discussed stretching exercises. -Rx Meloxicam provided  -Continue in CAM boot for next four weeks to protect from rupture.  -Discussed if no improvement will consider MRI/PT/EPAT/PRP injections.  -Patient to return to office  in 5 weeks for recheck.    Louann Sjogren, DPM

## 2023-01-12 NOTE — Patient Instructions (Signed)

## 2023-02-16 ENCOUNTER — Ambulatory Visit (INDEPENDENT_AMBULATORY_CARE_PROVIDER_SITE_OTHER): Payer: Federal, State, Local not specified - PPO | Admitting: Podiatry

## 2023-02-16 DIAGNOSIS — Z91199 Patient's noncompliance with other medical treatment and regimen due to unspecified reason: Secondary | ICD-10-CM

## 2023-02-16 NOTE — Progress Notes (Signed)
No show

## 2023-02-24 ENCOUNTER — Encounter: Payer: Federal, State, Local not specified - PPO | Admitting: Medical-Surgical

## 2023-03-06 ENCOUNTER — Encounter: Payer: Self-pay | Admitting: Family Medicine

## 2023-03-06 ENCOUNTER — Encounter: Payer: Self-pay | Admitting: Medical-Surgical

## 2023-03-06 ENCOUNTER — Ambulatory Visit: Payer: Federal, State, Local not specified - PPO | Admitting: Family Medicine

## 2023-03-06 VITALS — BP 121/81 | HR 82 | Temp 98.2°F | Resp 18 | Ht 67.0 in | Wt 207.0 lb

## 2023-03-06 DIAGNOSIS — E162 Hypoglycemia, unspecified: Secondary | ICD-10-CM | POA: Diagnosis not present

## 2023-03-06 DIAGNOSIS — K529 Noninfective gastroenteritis and colitis, unspecified: Secondary | ICD-10-CM

## 2023-03-06 NOTE — Progress Notes (Signed)
Acute Office Visit  Subjective:     Patient ID: Casey Hamilton, female    DOB: Sep 30, 1985, 37 y.o.   MRN: [id number]  Chief Complaint  Patient presents with   Emesis    Patient states that she has had nausea,vomiting, and diarrhea all night and early this morning, she states that she did not have a fever at all,  patient is also concerned about her Blood Sugar she states that her Josephine Igo was alerting her all through out the night and the early morning she states her last reading was at 1 am and it was 51    HPI Patient is in today for acute visit.  Pt reports hypoglycemia in the middle of the night/early morning. She says her libre CGM went off and noted sugar reading of 51, 59, 58, and 54. She last had meal at 7pm which consisted of chicken alfredo. She says after the alert came through, she started to have abdominal discomfort with nausea and vomiting x 2. She's also reported 4 episodes of loose watery stools. No fever. She has been on [facility] for diabetes and was given Zofran to use for this last year. She reports she hasn't tried taking this before this visit. She still has some at home.  Pt says her last mounjaro injection was Saturday.  Review of Systems  Constitutional:  Negative for malaise/fatigue.  Gastrointestinal:  Positive for abdominal pain, diarrhea, nausea and vomiting.  Endo/Heme/Allergies:        Hypoglycemia  All other systems reviewed and are negative.       Objective:    BP 121/81   Pulse 82   Temp 98.2 F (36.8 C) (Oral)   Resp 18   Ht 5\' 7"  (1.702 m)   Wt 207 lb (93.9 kg)   LMP 07/25/2020 Comment: neg preg test  SpO2 99%   BMI 32.42 kg/m  BP Readings from Last 3 Encounters:  03/06/23 121/81  01/01/23 127/87  08/04/22 123/77      Physical Exam Vitals and nursing note reviewed.  Constitutional:      Appearance: Normal appearance. She is normal weight.  HENT:     Head: Normocephalic and atraumatic.     Right Ear: External ear normal.      Left Ear: External ear normal.     Nose: Nose normal.     Mouth/Throat:     Mouth: Mucous membranes are moist.     Pharynx: Oropharynx is clear.  Eyes:     Conjunctiva/sclera: Conjunctivae normal.     Pupils: Pupils are equal, round, and reactive to light.  Cardiovascular:     Rate and Rhythm: Normal rate and regular rhythm.     Pulses: Normal pulses.     Heart sounds: Normal heart sounds.  Pulmonary:     Effort: Pulmonary effort is normal.     Breath sounds: Normal breath sounds.  Abdominal:     General: Abdomen is flat. Bowel sounds are normal.  Skin:    General: Skin is warm.     Capillary Refill: Capillary refill takes less than 2 seconds.  Neurological:     General: No focal deficit present.     Mental Status: She is alert and oriented to person, place, and time. Mental status is at baseline.  Psychiatric:        Mood and Affect: Mood normal.        Behavior: Behavior normal.        Thought Content: Thought content normal.  Judgment: Judgment normal.    No results found for any visits on 03/06/23.      Assessment & Plan:   Problem List Items Addressed This Visit   None Visit Diagnoses     Gastroenteritis    -  Primary   Hypoglycemia         Gastroenteritis  Hypoglycemia   Symptoms consistent with gastroenteritis. Push fluids, gatorade, sugary electrolyte replacement. Diet handout given to pt in order to offset the hypoglycemia.  To see PCP as scheduled this week.   No orders of the defined types were placed in this encounter.   Return for to see PCP as scheduled.  Suzan Slick, MD

## 2023-03-09 ENCOUNTER — Encounter: Payer: Self-pay | Admitting: Medical-Surgical

## 2023-03-09 ENCOUNTER — Encounter: Payer: Federal, State, Local not specified - PPO | Admitting: Medical-Surgical

## 2023-03-09 ENCOUNTER — Ambulatory Visit (INDEPENDENT_AMBULATORY_CARE_PROVIDER_SITE_OTHER): Payer: Federal, State, Local not specified - PPO | Admitting: Medical-Surgical

## 2023-03-09 VITALS — BP 126/78 | HR 75 | Resp 20 | Ht 67.0 in | Wt 205.0 lb

## 2023-03-09 DIAGNOSIS — Z0000 Encounter for general adult medical examination without abnormal findings: Secondary | ICD-10-CM | POA: Diagnosis not present

## 2023-03-09 DIAGNOSIS — E1142 Type 2 diabetes mellitus with diabetic polyneuropathy: Secondary | ICD-10-CM | POA: Diagnosis not present

## 2023-03-09 LAB — POCT UA - MICROALBUMIN
Creatinine, POC: 300 mg/dL
Microalbumin Ur, POC: 80 mg/L

## 2023-03-09 LAB — POCT GLYCOSYLATED HEMOGLOBIN (HGB A1C)
HbA1c, POC (controlled diabetic range): 6 % (ref 0.0–7.0)
Hemoglobin A1C: 6 % — AB (ref 4.0–5.6)

## 2023-03-09 MED ORDER — LOSARTAN POTASSIUM 25 MG PO TABS: 25.0000 mg | ORAL_TABLET | Freq: Every day | ORAL | 1 refills | Status: DC

## 2023-03-09 MED ORDER — TIRZEPATIDE 5 MG/0.5ML ~~LOC~~ SOPN
5.0000 mg | PEN_INJECTOR | SUBCUTANEOUS | 1 refills | Status: AC
Start: 2023-03-09 — End: ?

## 2023-03-09 NOTE — Progress Notes (Signed)
Complete physical exam  Patient: Casey Hamilton   DOB: November 22, 1985   37 y.o. Female  MRN: [id number]  Subjective:    Chief Complaint  Patient presents with   Annual Exam   Joi Magdelena Kinsella is a 37 y.o. female who presents today for a complete physical exam. She reports consuming a  low carb  diet. The patient does not participate in regular exercise at present. She generally feels well. She reports sleeping poorly. She does not have additional problems to discuss today.    Most recent fall risk assessment:    03/09/2023   11:05 AM  Fall Risk   Falls in the past year? 1  Number falls in past yr: 0  Injury with Fall? 1  Risk for fall due to : History of fall(s)  Follow up Falls evaluation completed     Most recent depression screenings:    03/09/2023   11:05 AM 08/04/2022    4:00 PM  PHQ 2/9 Scores  PHQ - 2 Score 1 1  PHQ- 9 Score 6     Vision:Within last year and Dental: No current dental problems and Receives regular dental care    Patient Care Team: Christen Butter, NP as PCP - General (Nurse Practitioner)   Outpatient Medications Prior to Visit  Medication Sig   Continuous Blood Gluc Sensor (FREESTYLE LIBRE 3 SENSOR) MISC Place 1 sensor on the skin every 14 days. Use to check glucose continuously   ondansetron (ZOFRAN-ODT) 4 MG disintegrating tablet Take 1 tablet (4 mg total) by mouth every 8 (eight) hours as needed for nausea or vomiting.   [DISCONTINUED] tirzepatide [facility]) 5 MG/0.5ML Pen Inject 5 mg into the skin once a week.   [DISCONTINUED] meloxicam (MOBIC) 15 MG tablet Take 1 tablet (15 mg total) by mouth daily.   No facility-administered medications prior to visit.   Review of Systems  Constitutional:  Negative for chills, fever, malaise/fatigue and weight loss.  HENT:  Negative for congestion, ear pain, hearing loss, sinus pain and sore throat.   Eyes:  Negative for blurred vision, photophobia and pain.  Respiratory:  Negative for cough,  shortness of breath and wheezing.   Cardiovascular:  Negative for chest pain, palpitations and leg swelling.  Gastrointestinal:  Negative for abdominal pain, constipation, diarrhea, heartburn, nausea and vomiting.  Genitourinary:  Negative for dysuria, frequency and urgency.  Musculoskeletal:  Negative for falls and neck pain.  Skin:  Negative for itching and rash.  Neurological:  Negative for dizziness, weakness and headaches.  Endo/Heme/Allergies:  Negative for polydipsia. Does not bruise/bleed easily.  Psychiatric/Behavioral:  Negative for depression, substance abuse and suicidal ideas. The patient is nervous/anxious and has insomnia.      Objective:    BP 126/78 (BP Location: Left Arm, Cuff Size: Normal)   Pulse 75   Resp 20   Ht 5\' 7"  (1.702 m)   Wt 205 lb (93 kg)   LMP 07/25/2020 Comment: neg preg test  SpO2 99%   BMI 32.11 kg/m    Physical Exam Vitals reviewed.  Constitutional:      General: She is not in acute distress.    Appearance: Normal appearance. She is not ill-appearing.  HENT:     Head: Normocephalic and atraumatic.     Right Ear: Tympanic membrane, ear canal and external ear normal. There is no impacted cerumen.     Left Ear: Tympanic membrane, ear canal and external ear normal. There is no impacted cerumen.  Nose: Nose normal. No congestion or rhinorrhea.     Mouth/Throat:     Mouth: Mucous membranes are moist.     Pharynx: No oropharyngeal exudate or posterior oropharyngeal erythema.  Eyes:     General: No scleral icterus.       Right eye: No discharge.        Left eye: No discharge.     Extraocular Movements: Extraocular movements intact.     Conjunctiva/sclera: Conjunctivae normal.     Pupils: Pupils are equal, round, and reactive to light.  Neck:     Thyroid: No thyromegaly.     Vascular: No carotid bruit or JVD.     Trachea: Trachea normal.  Cardiovascular:     Rate and Rhythm: Normal rate and regular rhythm.     Pulses: Normal pulses.      Heart sounds: Normal heart sounds. No murmur heard.    No friction rub. No gallop.  Pulmonary:     Effort: Pulmonary effort is normal. No respiratory distress.     Breath sounds: Normal breath sounds. No wheezing.  Abdominal:     General: Bowel sounds are normal. There is no distension.     Palpations: Abdomen is soft.     Tenderness: There is no abdominal tenderness. There is no guarding.  Musculoskeletal:        General: Normal range of motion.     Cervical back: Normal range of motion and neck supple.  Lymphadenopathy:     Cervical: No cervical adenopathy.  Skin:    General: Skin is warm and dry.  Neurological:     Mental Status: She is alert and oriented to person, place, and time.     Cranial Nerves: No cranial nerve deficit.  Psychiatric:        Mood and Affect: Mood normal.        Behavior: Behavior normal.        Thought Content: Thought content normal.        Judgment: Judgment normal.    Results for orders placed or performed in visit on 03/09/23  POCT HgB A1C  Result Value Ref Range   Hemoglobin A1C 6.0 (A) 4.0 - 5.6 %   HbA1c POC (<> result, manual entry)     HbA1c, POC (prediabetic range)     HbA1c, POC (controlled diabetic range) 6.0 0.0 - 7.0 %       Assessment & Plan:    Routine Health Maintenance and Physical Exam  Immunization History  Administered Date(s) Administered   Influenza,inj,Quad PF,6+ Mos 02/01/2016, 02/20/2017, 01/29/2019   Influenza-Unspecified 02/01/2016, 02/20/2017, 01/29/2019, 03/05/2021   Moderna Sars-Covid-2 Vaccination 07/19/2019, 08/16/2019   Tdap 09/09/2013   Tetanus 01/04/2014    Health Maintenance  Topic Date Due   FOOT EXAM  Never done   OPHTHALMOLOGY EXAM  Never done   Diabetic kidney evaluation - eGFR measurement  09/25/2022   COVID-19 Vaccine (3 - 2023-24 season) 03/25/2023 (Originally 02/05/2023)   INFLUENZA VACCINE  09/04/2023 (Originally 01/05/2023)   Hepatitis C Screening  03/08/2024 (Originally 09/14/2002)    Diabetic kidney evaluation - Urine ACR  08/04/2023   HEMOGLOBIN A1C  09/07/2023   DTaP/Tdap/Td (3 - Td or Tdap) 01/05/2024   HIV Screening  Completed   HPV VACCINES  Aged Out    Discussed health benefits of physical activity, and encouraged her to engage in regular exercise appropriate for her age and condition.  1. Annual physical exam Checking labs as below. UTD on preventative care. Wellness information  provided with AVS. - CBC with Differential/Platelet - CMP14+EGFR  2. Type 2 diabetes mellitus with peripheral neuropathy (HCC) Recent diagnosis of type 2 diabetes 6 months ago with a hemoglobin A1c of 6.7%.  She has been using Mounjaro 5 mg weekly, tolerating well without side effects.  Has a libre 3 and notes that her sugars are doing well overall.  Has had a couple of lows early in the morning hours.  We did discuss adding a bedtime snack or increasing her carbohydrate count for dinner.  Checking labs as below.  Hemoglobin A1c recheck of 6.0% showing excellent control.  Continue Mounjaro 5 mg weekly.  Continue monitoring sugars at home.  Urine microalbumin abnormal today, slightly worse than previous check.  Starting losartan 25 mg daily for renal protection. - Lipid panel - CMP14+EGFR - POCT HgB A1C - POCT UA - Microalbumin - tirzepatide (MOUNJARO) 5 MG/0.5ML Pen; Inject 5 mg into the skin once a week.  Dispense: 6 mL; Refill: 1  Return in about 6 months (around 09/07/2023) for DM follow up.   Christen Butter, NP

## 2023-03-09 NOTE — Patient Instructions (Signed)

## 2023-03-10 LAB — CBC WITH DIFFERENTIAL/PLATELET
Basophils Absolute: 0 10*3/uL (ref 0.0–0.2)
Basos: 0 %
EOS (ABSOLUTE): 0.4 10*3/uL (ref 0.0–0.4)
Eos: 5 %
Hematocrit: 43 % (ref 34.0–46.6)
Hemoglobin: 13.9 g/dL (ref 11.1–15.9)
Immature Grans (Abs): 0 10*3/uL (ref 0.0–0.1)
Immature Granulocytes: 0 %
Lymphocytes Absolute: 2 10*3/uL (ref 0.7–3.1)
Lymphs: 27 %
MCH: 27.6 pg (ref 26.6–33.0)
MCHC: 32.3 g/dL (ref 31.5–35.7)
MCV: 86 fL (ref 79–97)
Monocytes Absolute: 0.5 10*3/uL (ref 0.1–0.9)
Monocytes: 7 %
Neutrophils Absolute: 4.5 10*3/uL (ref 1.4–7.0)
Neutrophils: 61 %
Platelets: 272 10*3/uL (ref 150–450)
RBC: 5.03 x10E6/uL (ref 3.77–5.28)
RDW: 13.3 % (ref 11.7–15.4)
WBC: 7.4 10*3/uL (ref 3.4–10.8)

## 2023-03-10 LAB — LIPID PANEL
Chol/HDL Ratio: 3.3 {ratio} (ref 0.0–4.4)
Cholesterol, Total: 165 mg/dL (ref 100–199)
HDL: 50 mg/dL (ref >39)
LDL Chol Calc (NIH): 99 mg/dL (ref 0–99)
Triglycerides: 85 mg/dL (ref 0–149)
VLDL Cholesterol Cal: 16 mg/dL (ref 5–40)

## 2023-03-10 LAB — CMP14+EGFR
ALT: 16 [IU]/L (ref 0–32)
AST: 16 [IU]/L (ref 0–40)
Albumin: 4.6 g/dL (ref 3.9–4.9)
Alkaline Phosphatase: 80 [IU]/L (ref 44–121)
BUN/Creatinine Ratio: 17 (ref 9–23)
BUN: 12 mg/dL (ref 6–20)
Bilirubin Total: 0.4 mg/dL (ref 0.0–1.2)
CO2: 23 mmol/L (ref 20–29)
Calcium: 9.6 mg/dL (ref 8.7–10.2)
Chloride: 102 mmol/L (ref 96–106)
Creatinine, Ser: 0.72 mg/dL (ref 0.57–1.00)
Globulin, Total: 2.9 g/dL (ref 1.5–4.5)
Glucose: 79 mg/dL (ref 70–99)
Potassium: 4.3 mmol/L (ref 3.5–5.2)
Sodium: 140 mmol/L (ref 134–144)
Total Protein: 7.5 g/dL (ref 6.0–8.5)
eGFR: 110 mL/min/{1.73_m2} (ref >59)

## 2023-03-10 MED ORDER — TRAZODONE HCL 50 MG PO TABS: 25.0000 mg | ORAL_TABLET | Freq: Every evening | ORAL | 3 refills | Status: AC | PRN

## 2023-03-31 ENCOUNTER — Ambulatory Visit: Payer: Federal, State, Local not specified - PPO | Admitting: Medical-Surgical

## 2023-03-31 ENCOUNTER — Ambulatory Visit: Payer: Federal, State, Local not specified - PPO

## 2023-03-31 ENCOUNTER — Encounter: Payer: Self-pay | Admitting: Medical-Surgical

## 2023-03-31 VITALS — BP 126/77 | HR 71 | Resp 20 | Ht 67.0 in | Wt 200.7 lb

## 2023-03-31 DIAGNOSIS — M546 Pain in thoracic spine: Secondary | ICD-10-CM

## 2023-03-31 MED ORDER — KETOROLAC TROMETHAMINE 60 MG/2ML IM SOLN
60.0000 mg | Freq: Once | INTRAMUSCULAR | Status: AC
Start: 2023-03-31 — End: 2023-03-31
  Administered 2023-03-31: 60 mg via INTRAMUSCULAR

## 2023-03-31 MED ORDER — METHOCARBAMOL 500 MG PO TABS: 500.0000 mg | ORAL_TABLET | Freq: Three times a day (TID) | ORAL | 0 refills | Status: AC

## 2023-03-31 MED ORDER — METHYLPREDNISOLONE 4 MG PO TBPK: ORAL_TABLET | ORAL | 0 refills | Status: AC

## 2023-03-31 MED ORDER — METHYLPREDNISOLONE SODIUM SUCC 125 MG IJ SOLR
125.0000 mg | Freq: Once | INTRAMUSCULAR | Status: AC
Start: 2023-03-31 — End: 2023-03-31
  Administered 2023-03-31: 125 mg via INTRAMUSCULAR

## 2023-03-31 NOTE — Progress Notes (Signed)
        Established patient visit  History, exam, impression, and plan:  1. Acute midline thoracic back pain Casey Hamilton 37 year old female presenting today with reports of approximately 2 weeks of thoracic spine pain.  Notes that she woke up like this 1 morning where it was uncomfortable but not terrible.  She took some ibuprofen that day but the pain never went away.  It did get a little better but then has progressively worsened.  The pain is present regardless of position or activity.  Notes that it is constant aching with frequent sharp and stabbing pains.  She works in [employer] but has not been doing any unusual activities and does not remember an injury.  Last night she admits to taking to 800 mg ibuprofens but this was not helpful.  On exam, midline thoracic spine tenderness from approximately T2-3 through T6 with bilateral paraspinal muscle tenderness and spasm.  Getting thoracic spine x-rays today.  Unclear etiology.  Upper extremity and lower extremity strength intact, neurovascularly intact.  Toradol and Solu-Medrol given in office today.  Start Medrol Dosepak tomorrow, hold NSAIDs.  Adding methocarbamol 500 mg 3 times daily as needed.  Discussed other conservative measures including over-the-counter and topical analgesics.  Reviewed appropriate dosing recommendations for ibuprofen and advised to avoid taking this in excess due to risk for GI and renal adverse effects. - DG Thoracic Spine W/Swimmers; Future - ketorolac (TORADOL) injection 60 mg - methylPREDNISolone sodium succinate (SOLU-MEDROL) 125 mg/2 mL injection 125 mg  Procedures performed this visit: None.  Return for back pain in 4-6 weeks if not better.  __________________________________ Thayer Ohm, DNP, APRN, FNP-BC Primary Care and Sports Medicine [facility] [place]

## 2023-04-24 ENCOUNTER — Encounter: Payer: Self-pay | Admitting: Medical-Surgical

## 2023-04-24 DIAGNOSIS — M546 Pain in thoracic spine: Secondary | ICD-10-CM

## 2023-04-27 ENCOUNTER — Ambulatory Visit: Payer: Federal, State, Local not specified - PPO | Attending: Medical-Surgical

## 2023-04-27 ENCOUNTER — Other Ambulatory Visit: Payer: Self-pay

## 2023-04-27 DIAGNOSIS — M546 Pain in thoracic spine: Secondary | ICD-10-CM | POA: Diagnosis not present

## 2023-04-27 DIAGNOSIS — R293 Abnormal posture: Secondary | ICD-10-CM | POA: Diagnosis not present

## 2023-04-27 DIAGNOSIS — M6281 Muscle weakness (generalized): Secondary | ICD-10-CM | POA: Diagnosis not present

## 2023-04-27 NOTE — Therapy (Addendum)
OUTPATIENT PHYSICAL THERAPY CERVICAL EVALUATION   Patient Name: Casey Hamilton MRN: [id number] DOB:Sep 06, 1985, 37 y.o., female Today's Date: 04/27/2023  END OF SESSION:  PT End of Session - 04/27/23 1312     Visit Number 1    Number of Visits 9    Date for PT Re-Evaluation 06/29/23    Authorization Type BCBS    Progress Note Due on Visit 10    PT Start Time 0805    PT Stop Time 0845    PT Time Calculation (min) 40 min    Activity Tolerance Patient tolerated treatment well    Behavior During Therapy [facility] for tasks assessed/performed             Past Medical History:  Diagnosis Date   Allergy    Anemia    Contact dermatitis    Medical history non-contributory    Prediabetes    S/P tubal ligation 12/05/2013   Sleep apnea 00/2019   Past Surgical History:  Procedure Laterality Date   ABDOMINAL HYSTERECTOMY  09/25/2019   FOOT SURGERY Right    FOOT SURGERY Left 04/2019   heel spur and achiles release   TOTAL ABDOMINAL HYSTERECTOMY     TUBAL LIGATION Bilateral 12/04/2013   Procedure: POST PARTUM TUBAL LIGATION;  Surgeon: Kirkland Hun, MD;  Location: [facility];  Service: Gynecology;  Laterality: Bilateral;   WISDOM TOOTH EXTRACTION     Patient Active Problem List   Diagnosis Date Noted   Type 2 diabetes mellitus with peripheral neuropathy (HCC) 03/09/2023   OSA on CPAP 08/12/2019   Numbness of left foot 08/12/2019   Low back pain 08/01/2018   Atopic dermatitis 02/01/2016   Other seasonal allergic rhinitis 02/01/2016   PCP: Christen Butter, NP  REFERRING PROVIDER: Christen Butter, NP  REFERRING DIAG:  M54.6 (ICD-10-CM) - Acute midline thoracic back pain   Rationale for Evaluation and Treatment: Rehabilitation  THERAPY DIAG:  No diagnosis found.  ONSET DATE: 3 weeks ago  SUBJECTIVE:                                                                                                                                                                                            SUBJECTIVE STATEMENT: The patient reported insidiously developing midline upper/mid thoracic pain. She is unsure of any instigating event and does not have a history of pain in this area. She does have a chronic history of low back pain, but is unsure if this is related to her current complaint. When the pain did not improve with time, she visited her primary care provider who started her on a corticosteroid regimen. This was  on 03/31/2023 and the patient has not noted significant relief with the medication. She also trialed ibuprofen and Tylenol before seeing her PCP which did not seem to help anything. Reaching above head and carrying heavier grocery bags increases the pain, otherwise it is fairly constant. No alleviating factors have been found yet. She denied any history of neck or shoulder pain.  PERTINENT HISTORY:  No prior medical history the patient can think of.  PAIN:  Are you having pain? Yes: NPRS scale: 5 at rest, 8 at worst, 3 at best/10 Pain location: Midline upper/mid thoracic with some pain to either side Pain description: Sometimes pulling, sometimes stabbing Aggravating factors: See above Relieving factors: See above  PRECAUTIONS: None  RED FLAGS: None   WEIGHT BEARING RESTRICTIONS: No  FALLS:  Has patient fallen in last 6 months? No  PATIENT GOALS: Reducing/resolving the pain  OBJECTIVE:  Note: Objective measures were completed at Evaluation unless otherwise noted.  DIAGNOSTIC FINDINGS:  From MEDICAL RECORD NUMBER CLINICAL DATA:  Thoracic back pain.  Pain for 2 weeks.   EXAM: THORACIC SPINE - 3 VIEWS   COMPARISON:  None Available.   FINDINGS: The alignment is maintained. Vertebral body heights are maintained. No evidence of fracture or compression deformity. Minor anterior spurring in the midthoracic spine. No evidence of focal bone abnormality or bone destruction. There is no paravertebral soft tissue abnormality.   IMPRESSION: Minor anterior  spurring in the midthoracic spine. No acute findings.  PATIENT SURVEYS:  FOTO Current: 56%, Predicted: 72%  POSTURE: forward head and increased thoracic kyphosis  PALPATION: Thoracic / rib cage: -Central PA glides hypomobile/painful T1-T6; most marked T3-T6 -PA glides hypomobile/painful R1-R6 R and L, most marked R3-R6  Cervical spine: -Central PA glides painful at C7   CERVICAL ROM:   Active ROM A/PROM (deg) eval  Flexion WNL*  Extension WNL  Right lateral flexion WNL  Left lateral flexion WNL  Right rotation WNL  Left rotation WNL   (Blank rows = not tested)  *Cervical flexion painful  Thoracic ROM  Active ROM A/PROM (deg) eval  Flexion WNL  Extension restricted  Right lateral flexion WNL  Left lateral flexion restricted  Right rotation WNL  Left rotation restricted   (Blank rows = not tested)  UPPER EXTREMITY MMT:  MMT Right eval Left eval  Shoulder flexion 5 5  Shoulder extension    Shoulder abduction 5 5  Shoulder adduction    Shoulder extension 4 4  Shoulder internal rotation 5 5  Shoulder external rotation 5 5  Middle trapezius 4- 4-  Lower trapezius 4- 4-  Cervical flexion 4  Cervical extension 4  Cervical side flexion 4 4   (Blank rows = not tested)  [facility] Adult PT Treatment:                                                DATE: 04/27/2023 Therapeutic Exercise: Instructed home program: Supine cervical extension into towel roll with sternum lift and upper extremity assist, 3 x 10 reps Arm spirals, arms outstretched at 90 degrees abduction, one side externally rotating and the other internally rotating, then alternating, 3 x 10 reps Manual Therapy: Supine: Unilateral/bilateral PA glides T1-T6/R1-R6  PATIENT EDUCATION:  Education details: on current presentation, on HEP, on clinical outcomes score and POC Person educated: Patient Education method: Explanation, Demonstration, and Handouts Education comprehension: verbalized  understanding    HOME EXERCISE PROGRAM: Access Code: [email or web address] URL: [email or web address] Date: 04/27/2023 Prepared by: Edmonia Caprio  Exercises - Supine Isometric Neck Extension  - 1 x daily - 7 x weekly - 3 sets - 10 reps - Shoulder Internal and External Rotation in Abduction  - 1 x daily - 7 x weekly - 3 sets - 10 reps  ASSESSMENT:  CLINICAL IMPRESSION: Patient is a 37 y.o. who was seen today for physical therapy evaluation and treatment for upper/mid thoracic pain. Movement restriction was noted in this region, with the upper/mid thoracic spine having difficulty extending, side bending L, and rotating L. Other potential contributing factors include weakness at the neck and scapular musculature. Patient will benefit from physical therapy to address these impairments to reduce pain and improve function.   OBJECTIVE IMPAIRMENTS: decreased activity tolerance, decreased mobility, postural dysfunction, and pain.   REHAB POTENTIAL: Good  CLINICAL DECISION MAKING: Stable/uncomplicated  EVALUATION COMPLEXITY: Low  GOALS: Goals reviewed with patient? yes  SHORT TERM GOALS: Target date: 05/25/2023   Patient will be independent in self management strategies to improve quality of life and functional outcomes. Baseline: New Program Goal status: INITIAL  2.  Patient will report at least 50% improvement in overall symptoms and/or function to demonstrate improved functional mobility Baseline: 0% better Goal status: INITIAL  3. Patient will report no limitation with activities like moving a table or pushing a vacuum cleaner on FOTO questionnaire.  Baseline: Limited a little  Goal status: INITIAL  4. Patient will report no limitation participating in recreation on FOTO questionnaire.  Baseline: Limited a little  Goal status: INITIAL  Climbing several flights of stairs Limited A Little -- Lifting or carrying items like groceries Limited A Little --  Sleeping Pain prevents me from  sleeping more than 6 hours -- Lifting overhead Limited A Little  LONG TERM GOALS: Target date: 06/22/2023    Patient will report at least 75% improvement in overall symptoms and/or function to demonstrate improved functional mobility Baseline: 0% better Goal status: INITIAL  2.  Patient will report no difficulty lifting overhead on FOTO questionnaire.  Baseline: Limited a little Goal status: INITIAL  3.  Patient will report no difficulty lifting or carrying groceries on FOTO questionnaire. Baseline: Limited a little Goal status: INITIAL   4. Patient will report pain does not interfere with sleep on the FOTO questionnaire.  Baseline: pain prevents me from sleeping more than 6 hours  Goal status: INITIAL PLAN:  PT FREQUENCY: 1x/week  PT DURATION: 8 weeks  PLANNED INTERVENTIONS: 97110-Therapeutic exercises, 97530- Therapeutic activity, [id number]- Neuromuscular re-education, 97535- Self Care, 64332- Manual therapy, [phone]- Gait training, [phone]- Orthotic Fit/training, [phone]- Canalith repositioning, [id number]- Aquatic Therapy, 97014- Electrical stimulation (unattended), [phone]- Ionotophoresis 4mg /ml Dexamethasone, Patient/Family education, Balance training, Stair training, Taping, Dry Needling, Joint mobilization, Joint manipulation, Spinal manipulation, Spinal mobilization, Cryotherapy, and Moist heat   PLAN FOR NEXT SESSION: Upper/mid thoracic mobilizations, upper/mid rib cage mobilizations, neck strengthening, scapular strengthening, postural training, thoracic extension/side bend/rotation mobility exercises, other manual therapies as indicated  Clelia Schaumann, PT 04/27/2023, 1:23 PM

## 2023-04-27 NOTE — Addendum Note (Signed)
Addended by: Clelia Schaumann on: 04/27/2023 05:33 PM   Modules accepted: Orders

## 2023-05-11 ENCOUNTER — Ambulatory Visit: Payer: Federal, State, Local not specified - PPO

## 2023-05-25 ENCOUNTER — Ambulatory Visit: Payer: Federal, State, Local not specified - PPO

## 2023-06-01 ENCOUNTER — Encounter: Payer: Self-pay | Admitting: Medical-Surgical

## 2023-06-01 DIAGNOSIS — M546 Pain in thoracic spine: Secondary | ICD-10-CM

## 2023-06-02 ENCOUNTER — Telehealth: Payer: Self-pay

## 2023-06-02 NOTE — Telephone Encounter (Signed)
Copied from CRM [id number]. Topic: General - Other >> Jun 02, 2023 12:16 PM Alvino Blood C wrote: Reason for CRM: Rosa from [facility] is requesting Patient demographics be sent to assist in completing the patients appointments. Information should be faxed to [phone]

## 2023-06-02 NOTE — Telephone Encounter (Signed)
Faxed demographics to [facility].

## 2023-06-16 ENCOUNTER — Telehealth: Payer: Self-pay | Admitting: Medical-Surgical

## 2023-06-16 NOTE — Telephone Encounter (Signed)
 Initiated Prior authorization for: Mounjaro Via: Covermymeds Case/Key: [id number] Status: Pending as of 06/16/23 3:41 PM  Notified Pt via: Mychart

## 2023-06-16 NOTE — Telephone Encounter (Signed)
 Initiated Prior authorization for: Mounjaro Via: Covermymeds Case/Key: [id number] Status: Approved from 05/17/2023 through 06/15/2024 Notified Pt via: Mychart

## 2023-06-22 DIAGNOSIS — M5451 Vertebrogenic low back pain: Secondary | ICD-10-CM | POA: Diagnosis not present

## 2023-06-26 ENCOUNTER — Encounter: Payer: Self-pay | Admitting: Medical-Surgical

## 2023-06-26 DIAGNOSIS — M546 Pain in thoracic spine: Secondary | ICD-10-CM

## 2023-07-13 DIAGNOSIS — M5489 Other dorsalgia: Secondary | ICD-10-CM | POA: Diagnosis not present

## 2023-07-13 DIAGNOSIS — M4804 Spinal stenosis, thoracic region: Secondary | ICD-10-CM | POA: Diagnosis not present

## 2023-07-14 DIAGNOSIS — M4804 Spinal stenosis, thoracic region: Secondary | ICD-10-CM | POA: Diagnosis not present

## 2023-07-20 DIAGNOSIS — G8929 Other chronic pain: Secondary | ICD-10-CM | POA: Diagnosis not present

## 2023-07-20 DIAGNOSIS — Z124 Encounter for screening for malignant neoplasm of cervix: Secondary | ICD-10-CM | POA: Diagnosis not present

## 2023-07-20 DIAGNOSIS — E1169 Type 2 diabetes mellitus with other specified complication: Secondary | ICD-10-CM | POA: Diagnosis not present

## 2023-07-20 DIAGNOSIS — Z1159 Encounter for screening for other viral diseases: Secondary | ICD-10-CM | POA: Diagnosis not present

## 2023-07-20 DIAGNOSIS — M546 Pain in thoracic spine: Secondary | ICD-10-CM | POA: Diagnosis not present

## 2023-08-03 DIAGNOSIS — M4804 Spinal stenosis, thoracic region: Secondary | ICD-10-CM | POA: Diagnosis not present

## 2023-08-03 DIAGNOSIS — M5489 Other dorsalgia: Secondary | ICD-10-CM | POA: Diagnosis not present

## 2023-08-10 ENCOUNTER — Other Ambulatory Visit: Payer: Self-pay | Admitting: Medical-Surgical

## 2023-09-07 ENCOUNTER — Ambulatory Visit: Payer: Federal, State, Local not specified - PPO | Admitting: Medical-Surgical

## 2023-09-18 DIAGNOSIS — R202 Paresthesia of skin: Secondary | ICD-10-CM | POA: Diagnosis not present

## 2023-09-18 DIAGNOSIS — G4700 Insomnia, unspecified: Secondary | ICD-10-CM | POA: Diagnosis not present

## 2023-09-18 DIAGNOSIS — E1169 Type 2 diabetes mellitus with other specified complication: Secondary | ICD-10-CM | POA: Diagnosis not present

## 2023-09-18 DIAGNOSIS — R232 Flushing: Secondary | ICD-10-CM | POA: Diagnosis not present

## 2023-09-29 ENCOUNTER — Other Ambulatory Visit: Payer: Self-pay

## 2023-09-29 MED ORDER — LOSARTAN POTASSIUM 25 MG PO TABS: 25.0000 mg | ORAL_TABLET | Freq: Every day | ORAL | 0 refills | Status: AC

## 2023-10-09 DIAGNOSIS — G4700 Insomnia, unspecified: Secondary | ICD-10-CM | POA: Diagnosis not present

## 2023-10-09 DIAGNOSIS — R232 Flushing: Secondary | ICD-10-CM | POA: Diagnosis not present

## 2023-10-09 DIAGNOSIS — E1121 Type 2 diabetes mellitus with diabetic nephropathy: Secondary | ICD-10-CM | POA: Diagnosis not present

## 2023-10-09 DIAGNOSIS — E1142 Type 2 diabetes mellitus with diabetic polyneuropathy: Secondary | ICD-10-CM | POA: Diagnosis not present

## 2024-01-26 DIAGNOSIS — J209 Acute bronchitis, unspecified: Secondary | ICD-10-CM | POA: Diagnosis not present

## 2024-01-26 DIAGNOSIS — Z20822 Contact with and (suspected) exposure to covid-19: Secondary | ICD-10-CM | POA: Diagnosis not present

## 2024-01-26 DIAGNOSIS — R059 Cough, unspecified: Secondary | ICD-10-CM | POA: Diagnosis not present
